# Patient Record
Sex: Male | Born: 1950 | Race: White | Hispanic: No | Marital: Married | Smoking: Former smoker
Health system: Southern US, Community
[De-identification: ages and names within clinical notes are randomized; demographics above are authoritative.]

## PROBLEM LIST (undated history)

## (undated) DIAGNOSIS — E785 Hyperlipidemia, unspecified: Secondary | ICD-10-CM

## (undated) DIAGNOSIS — L570 Actinic keratosis: Secondary | ICD-10-CM

## (undated) DIAGNOSIS — I10 Essential (primary) hypertension: Secondary | ICD-10-CM

## (undated) DIAGNOSIS — E119 Type 2 diabetes mellitus without complications: Secondary | ICD-10-CM

## (undated) DIAGNOSIS — E669 Obesity, unspecified: Secondary | ICD-10-CM

## (undated) HISTORY — DX: Essential (primary) hypertension: I10

## (undated) HISTORY — DX: Obesity, unspecified: E66.9

## (undated) HISTORY — DX: Type 2 diabetes mellitus without complications: E11.9

## (undated) HISTORY — DX: Actinic keratosis: L57.0

## (undated) HISTORY — DX: Hyperlipidemia, unspecified: E78.5

## (undated) HISTORY — PX: ROTATOR CUFF REPAIR: SHX139

---

## 1999-03-30 ENCOUNTER — Emergency Department (HOSPITAL_COMMUNITY): Admission: EM | Admit: 1999-03-30 | Discharge: 1999-03-30 | Payer: Self-pay | Admitting: Emergency Medicine

## 1999-03-30 ENCOUNTER — Encounter: Payer: Self-pay | Admitting: Emergency Medicine

## 2000-01-13 ENCOUNTER — Emergency Department (HOSPITAL_COMMUNITY): Admission: EM | Admit: 2000-01-13 | Discharge: 2000-01-13 | Payer: Self-pay | Admitting: Emergency Medicine

## 2000-01-22 ENCOUNTER — Encounter: Admission: RE | Admit: 2000-01-22 | Discharge: 2000-04-21 | Payer: Self-pay | Admitting: Internal Medicine

## 2001-06-19 ENCOUNTER — Ambulatory Visit (HOSPITAL_COMMUNITY): Admission: RE | Admit: 2001-06-19 | Discharge: 2001-06-19 | Payer: Self-pay | Admitting: Gastroenterology

## 2001-07-07 ENCOUNTER — Emergency Department (HOSPITAL_COMMUNITY): Admission: EM | Admit: 2001-07-07 | Discharge: 2001-07-07 | Payer: Self-pay | Admitting: Emergency Medicine

## 2001-10-09 ENCOUNTER — Ambulatory Visit (HOSPITAL_BASED_OUTPATIENT_CLINIC_OR_DEPARTMENT_OTHER): Admission: RE | Admit: 2001-10-09 | Discharge: 2001-10-09 | Payer: Self-pay | Admitting: Surgery

## 2001-10-09 ENCOUNTER — Encounter (INDEPENDENT_AMBULATORY_CARE_PROVIDER_SITE_OTHER): Payer: Self-pay | Admitting: *Deleted

## 2004-07-20 ENCOUNTER — Ambulatory Visit: Payer: Self-pay | Admitting: Internal Medicine

## 2004-07-25 ENCOUNTER — Ambulatory Visit: Payer: Self-pay | Admitting: Internal Medicine

## 2004-08-22 ENCOUNTER — Ambulatory Visit: Payer: Self-pay | Admitting: Internal Medicine

## 2004-09-27 ENCOUNTER — Ambulatory Visit: Payer: Self-pay | Admitting: Internal Medicine

## 2005-01-15 ENCOUNTER — Encounter: Admission: RE | Admit: 2005-01-15 | Discharge: 2005-01-15 | Payer: Self-pay | Admitting: Orthopedic Surgery

## 2005-02-01 ENCOUNTER — Ambulatory Visit: Payer: Self-pay | Admitting: Critical Care Medicine

## 2005-02-06 ENCOUNTER — Ambulatory Visit: Payer: Self-pay | Admitting: Internal Medicine

## 2005-03-07 ENCOUNTER — Ambulatory Visit (HOSPITAL_COMMUNITY): Admission: RE | Admit: 2005-03-07 | Discharge: 2005-03-07 | Payer: Self-pay | Admitting: Orthopedic Surgery

## 2005-03-07 ENCOUNTER — Ambulatory Visit (HOSPITAL_BASED_OUTPATIENT_CLINIC_OR_DEPARTMENT_OTHER): Admission: RE | Admit: 2005-03-07 | Discharge: 2005-03-08 | Payer: Self-pay | Admitting: Orthopedic Surgery

## 2005-05-22 ENCOUNTER — Ambulatory Visit: Payer: Self-pay | Admitting: Internal Medicine

## 2005-06-21 ENCOUNTER — Ambulatory Visit: Payer: Self-pay | Admitting: Internal Medicine

## 2005-09-24 ENCOUNTER — Ambulatory Visit: Payer: Self-pay | Admitting: Internal Medicine

## 2005-12-27 ENCOUNTER — Ambulatory Visit: Payer: Self-pay | Admitting: Internal Medicine

## 2006-03-28 ENCOUNTER — Ambulatory Visit: Payer: Self-pay | Admitting: Internal Medicine

## 2006-06-10 ENCOUNTER — Ambulatory Visit: Payer: Self-pay | Admitting: Internal Medicine

## 2006-06-18 ENCOUNTER — Ambulatory Visit: Payer: Self-pay | Admitting: Internal Medicine

## 2006-10-01 ENCOUNTER — Ambulatory Visit: Payer: Self-pay | Admitting: Internal Medicine

## 2006-10-01 LAB — CONVERTED CEMR LAB
ALT: 32 units/L (ref 0–40)
AST: 29 units/L (ref 0–37)
Albumin: 4.1 g/dL (ref 3.5–5.2)
Alkaline Phosphatase: 49 units/L (ref 39–117)
BUN: 19 mg/dL (ref 6–23)
Bilirubin, Direct: 0.2 mg/dL (ref 0.0–0.3)
CO2: 26 meq/L (ref 19–32)
Calcium: 9.2 mg/dL (ref 8.4–10.5)
Chloride: 104 meq/L (ref 96–112)
Cholesterol: 124 mg/dL (ref 0–200)
Creatinine, Ser: 0.9 mg/dL (ref 0.4–1.5)
GFR calc Af Amer: 113 mL/min
GFR calc non Af Amer: 93 mL/min
Glucose, Bld: 203 mg/dL — ABNORMAL HIGH (ref 70–99)
HDL: 47.4 mg/dL (ref >39.0)
Hgb A1c MFr Bld: 8.2 % — ABNORMAL HIGH (ref 4.6–6.0)
LDL Cholesterol: 66 mg/dL (ref 0–99)
Potassium: 3.9 meq/L (ref 3.5–5.1)
Sodium: 136 meq/L (ref 135–145)
Total Bilirubin: 0.8 mg/dL (ref 0.3–1.2)
Total CHOL/HDL Ratio: 2.6
Total Protein: 7 g/dL (ref 6.0–8.3)
Triglycerides: 52 mg/dL (ref 0–149)
VLDL: 10 mg/dL (ref 0–40)

## 2006-10-15 ENCOUNTER — Ambulatory Visit: Payer: Self-pay | Admitting: Pulmonary Disease

## 2007-02-04 ENCOUNTER — Ambulatory Visit: Payer: Self-pay | Admitting: Internal Medicine

## 2007-02-04 LAB — CONVERTED CEMR LAB
BUN: 13 mg/dL
CO2: 27 meq/L
Calcium: 9.6 mg/dL
Chloride: 102 meq/L
Creatinine, Ser: 0.9 mg/dL
GFR calc Af Amer: 112 mL/min
GFR calc non Af Amer: 93 mL/min
Glucose, Bld: 145 mg/dL — ABNORMAL HIGH
Hgb A1c MFr Bld: 7.6 % — ABNORMAL HIGH
Potassium: 4.3 meq/L
Sodium: 137 meq/L
Troponin I: 0.04 ng/mL (ref <0.06)

## 2007-04-14 ENCOUNTER — Ambulatory Visit: Payer: Self-pay | Admitting: Internal Medicine

## 2007-04-24 DIAGNOSIS — I10 Essential (primary) hypertension: Secondary | ICD-10-CM | POA: Insufficient documentation

## 2007-04-24 DIAGNOSIS — E1165 Type 2 diabetes mellitus with hyperglycemia: Secondary | ICD-10-CM

## 2007-04-24 DIAGNOSIS — E669 Obesity, unspecified: Secondary | ICD-10-CM | POA: Insufficient documentation

## 2007-06-27 ENCOUNTER — Ambulatory Visit: Payer: Self-pay | Admitting: Internal Medicine

## 2007-06-27 DIAGNOSIS — L570 Actinic keratosis: Secondary | ICD-10-CM | POA: Insufficient documentation

## 2007-06-27 DIAGNOSIS — E785 Hyperlipidemia, unspecified: Secondary | ICD-10-CM | POA: Insufficient documentation

## 2007-06-27 DIAGNOSIS — M2550 Pain in unspecified joint: Secondary | ICD-10-CM | POA: Insufficient documentation

## 2007-06-27 LAB — CONVERTED CEMR LAB
ALT: 33 units/L (ref 0–53)
AST: 28 units/L (ref 0–37)
Basophils Relative: 0 % (ref 0.0–1.0)
Bilirubin, Direct: 0.2 mg/dL (ref 0.0–0.3)
CO2: 31 meq/L (ref 19–32)
Calcium: 9.7 mg/dL (ref 8.4–10.5)
Chloride: 104 meq/L (ref 96–112)
Eosinophils Relative: 1.9 % (ref 0.0–5.0)
GFR calc non Af Amer: 93 mL/min
Glucose, Bld: 162 mg/dL — ABNORMAL HIGH (ref 70–99)
Ketones, ur: NEGATIVE mg/dL
LDL Cholesterol: 87 mg/dL (ref 0–99)
Nitrite: NEGATIVE
Platelets: 171 10*3/uL (ref 150–400)
RBC: 4.68 M/uL (ref 4.22–5.81)
RDW: 12.8 % (ref 11.5–14.6)
Specific Gravity, Urine: 1.01 (ref 1.000–1.03)
Total CHOL/HDL Ratio: 3.4
Total Protein, Urine: NEGATIVE mg/dL
Urine Glucose: NEGATIVE mg/dL
WBC: 6.2 10*3/uL (ref 4.5–10.5)

## 2007-08-08 ENCOUNTER — Encounter: Payer: Self-pay | Admitting: Internal Medicine

## 2007-10-21 ENCOUNTER — Ambulatory Visit: Payer: Self-pay | Admitting: Internal Medicine

## 2007-10-22 ENCOUNTER — Telehealth (INDEPENDENT_AMBULATORY_CARE_PROVIDER_SITE_OTHER): Payer: Self-pay | Admitting: *Deleted

## 2007-11-06 ENCOUNTER — Encounter (INDEPENDENT_AMBULATORY_CARE_PROVIDER_SITE_OTHER): Payer: Self-pay | Admitting: *Deleted

## 2007-11-11 LAB — CONVERTED CEMR LAB
BUN: 13 mg/dL (ref 6–23)
GFR calc Af Amer: 112 mL/min
Glucose, Bld: 162 mg/dL — ABNORMAL HIGH (ref 70–99)
Potassium: 4 meq/L (ref 3.5–5.1)

## 2008-03-17 ENCOUNTER — Ambulatory Visit: Payer: Self-pay | Admitting: Internal Medicine

## 2008-03-18 LAB — CONVERTED CEMR LAB
ALT: 27 units/L (ref 0–53)
Alkaline Phosphatase: 54 units/L (ref 39–117)
Bilirubin, Direct: 0.1 mg/dL (ref 0.0–0.3)
CO2: 30 meq/L (ref 19–32)
Chloride: 106 meq/L (ref 96–112)
Glucose, Bld: 110 mg/dL — ABNORMAL HIGH (ref 70–99)
HDL: 36.6 mg/dL — ABNORMAL LOW (ref >39.0)
Hemoglobin: 14.9 g/dL (ref 13.0–17.0)
LDL Cholesterol: 88 mg/dL (ref 0–99)
Lymphocytes Relative: 22.8 % (ref 12.0–46.0)
Monocytes Relative: 8 % (ref 3.0–12.0)
Neutrophils Relative %: 67.8 % (ref 43.0–77.0)
Platelets: 155 10*3/uL (ref 150–400)
Potassium: 4.1 meq/L (ref 3.5–5.1)
RDW: 13.3 % (ref 11.5–14.6)
Sodium: 141 meq/L (ref 135–145)
Total Bilirubin: 1 mg/dL (ref 0.3–1.2)
Total CHOL/HDL Ratio: 3.7
Total Protein: 7.6 g/dL (ref 6.0–8.3)
Triglycerides: 55 mg/dL (ref 0–149)
VLDL: 11 mg/dL (ref 0–40)

## 2008-07-01 ENCOUNTER — Ambulatory Visit: Payer: Self-pay | Admitting: Internal Medicine

## 2008-07-05 ENCOUNTER — Telehealth (INDEPENDENT_AMBULATORY_CARE_PROVIDER_SITE_OTHER): Payer: Self-pay | Admitting: *Deleted

## 2008-09-28 ENCOUNTER — Ambulatory Visit: Payer: Self-pay | Admitting: Internal Medicine

## 2008-09-28 DIAGNOSIS — R05 Cough: Secondary | ICD-10-CM

## 2008-09-28 LAB — CONVERTED CEMR LAB
CO2: 26 meq/L (ref 19–32)
Calcium: 9.4 mg/dL (ref 8.4–10.5)
Chloride: 104 meq/L (ref 96–112)
Cholesterol: 127 mg/dL (ref 0–200)
Creatinine, Ser: 0.9 mg/dL (ref 0.4–1.5)
Glucose, Bld: 168 mg/dL — ABNORMAL HIGH (ref 70–99)
HDL: 41 mg/dL (ref >39.00)
Total CHOL/HDL Ratio: 3
Triglycerides: 42 mg/dL (ref 0.0–149.0)

## 2009-03-08 ENCOUNTER — Ambulatory Visit: Payer: Self-pay | Admitting: Internal Medicine

## 2009-03-08 DIAGNOSIS — J310 Chronic rhinitis: Secondary | ICD-10-CM

## 2009-03-08 LAB — CONVERTED CEMR LAB
ALT: 27 units/L (ref 0–53)
Albumin: 4 g/dL (ref 3.5–5.2)
Alkaline Phosphatase: 53 units/L (ref 39–117)
BUN: 18 mg/dL (ref 6–23)
Bilirubin, Direct: 0.1 mg/dL (ref 0.0–0.3)
CO2: 27 meq/L (ref 19–32)
Calcium: 9.4 mg/dL (ref 8.4–10.5)
GFR calc non Af Amer: 105.47 mL/min (ref >60)
Glucose, Bld: 131 mg/dL — ABNORMAL HIGH (ref 70–99)
Sodium: 140 meq/L (ref 135–145)
Total Protein: 7.1 g/dL (ref 6.0–8.3)

## 2009-04-19 ENCOUNTER — Encounter: Payer: Self-pay | Admitting: Internal Medicine

## 2009-05-30 ENCOUNTER — Ambulatory Visit: Payer: Self-pay | Admitting: Internal Medicine

## 2009-05-31 ENCOUNTER — Telehealth: Payer: Self-pay | Admitting: Internal Medicine

## 2009-05-31 LAB — CONVERTED CEMR LAB
BUN: 16 mg/dL (ref 6–23)
Calcium: 9.1 mg/dL (ref 8.4–10.5)
GFR calc non Af Amer: 91.99 mL/min (ref >60)
Glucose, Bld: 205 mg/dL — ABNORMAL HIGH (ref 70–99)
Hgb A1c MFr Bld: 7.8 % — ABNORMAL HIGH (ref 4.6–6.5)
Sodium: 140 meq/L (ref 135–145)
Total CHOL/HDL Ratio: 3

## 2009-09-16 ENCOUNTER — Telehealth: Payer: Self-pay | Admitting: Internal Medicine

## 2009-10-03 ENCOUNTER — Ambulatory Visit: Payer: Self-pay | Admitting: Internal Medicine

## 2009-10-04 LAB — CONVERTED CEMR LAB
AST: 23 units/L (ref 0–37)
BUN: 15 mg/dL (ref 6–23)
Basophils Absolute: 0 10*3/uL (ref 0.0–0.1)
Bilirubin Urine: NEGATIVE
Bilirubin, Direct: 0.1 mg/dL (ref 0.0–0.3)
Calcium: 9.5 mg/dL (ref 8.4–10.5)
Cholesterol: 112 mg/dL (ref 0–200)
Creatinine, Ser: 0.9 mg/dL (ref 0.4–1.5)
GFR calc non Af Amer: 91.88 mL/min (ref >60)
Glucose, Bld: 186 mg/dL — ABNORMAL HIGH (ref 70–99)
HCT: 41 % (ref 39.0–52.0)
HDL: 43.2 mg/dL (ref >39.00)
LDL Cholesterol: 56 mg/dL (ref 0–99)
Leukocytes, UA: NEGATIVE
Lymphocytes Relative: 36.1 % (ref 12.0–46.0)
Lymphs Abs: 1.8 10*3/uL (ref 0.7–4.0)
Microalb Creat Ratio: 4.9 mg/g (ref 0.0–30.0)
Monocytes Relative: 7.7 % (ref 3.0–12.0)
Neutrophils Relative %: 53.7 % (ref 43.0–77.0)
Nitrite: NEGATIVE
Platelets: 140 10*3/uL — ABNORMAL LOW (ref 150.0–400.0)
RDW: 13.6 % (ref 11.5–14.6)
TSH: 1.84 microintl units/mL (ref 0.35–5.50)
Total Bilirubin: 0.5 mg/dL (ref 0.3–1.2)
VLDL: 12.8 mg/dL (ref 0.0–40.0)
pH: 5.5 (ref 5.0–8.0)

## 2009-11-22 ENCOUNTER — Ambulatory Visit: Payer: Self-pay | Admitting: Internal Medicine

## 2009-11-22 DIAGNOSIS — R109 Unspecified abdominal pain: Secondary | ICD-10-CM | POA: Insufficient documentation

## 2009-11-22 LAB — CONVERTED CEMR LAB
Ketones, ur: NEGATIVE mg/dL
Urine Glucose: NEGATIVE mg/dL
Urobilinogen, UA: 0.2 (ref 0.0–1.0)

## 2010-01-05 ENCOUNTER — Ambulatory Visit: Payer: Self-pay | Admitting: Internal Medicine

## 2010-01-06 LAB — CONVERTED CEMR LAB
BUN: 21 mg/dL (ref 6–23)
Calcium: 9.4 mg/dL (ref 8.4–10.5)
Creatinine, Ser: 0.9 mg/dL (ref 0.4–1.5)
GFR calc non Af Amer: 98.06 mL/min (ref >60)
Glucose, Bld: 209 mg/dL — ABNORMAL HIGH (ref 70–99)

## 2010-01-17 ENCOUNTER — Emergency Department (HOSPITAL_COMMUNITY): Admission: EM | Admit: 2010-01-17 | Discharge: 2010-01-17 | Payer: Self-pay | Admitting: Emergency Medicine

## 2010-04-11 ENCOUNTER — Ambulatory Visit: Payer: Self-pay | Admitting: Internal Medicine

## 2010-04-12 LAB — CONVERTED CEMR LAB
AST: 25 units/L (ref 0–37)
Albumin: 4.3 g/dL (ref 3.5–5.2)
Bilirubin Urine: NEGATIVE
Bilirubin, Direct: 0.1 mg/dL (ref 0.0–0.3)
CO2: 26 meq/L (ref 19–32)
Calcium: 9.7 mg/dL (ref 8.4–10.5)
Chloride: 105 meq/L (ref 96–112)
Leukocytes, UA: NEGATIVE
Nitrite: NEGATIVE
Sodium: 139 meq/L (ref 135–145)
Specific Gravity, Urine: >=1.03 (ref 1.000–1.030)
Total Bilirubin: 0.5 mg/dL (ref 0.3–1.2)
Total CHOL/HDL Ratio: 3
Total Protein: 7.1 g/dL (ref 6.0–8.3)
Urobilinogen, UA: 0.2 (ref 0.0–1.0)

## 2010-06-21 ENCOUNTER — Telehealth (INDEPENDENT_AMBULATORY_CARE_PROVIDER_SITE_OTHER): Payer: Self-pay | Admitting: *Deleted

## 2010-08-06 LAB — CONVERTED CEMR LAB
ALT: 29 units/L (ref 0–53)
AST: 26 units/L (ref 0–37)
Alkaline Phosphatase: 55 units/L (ref 39–117)
Basophils Absolute: 0 10*3/uL (ref 0.0–0.1)
Basophils Relative: 0.2 % (ref 0.0–3.0)
Bilirubin, Direct: 0.1 mg/dL (ref 0.0–0.3)
CO2: 30 meq/L (ref 19–32)
CRP, High Sensitivity: 5 (ref 0.00–5.00)
Chloride: 105 meq/L (ref 96–112)
Creatinine, Ser: 0.8 mg/dL (ref 0.4–1.5)
Eosinophils Absolute: 0.1 10*3/uL (ref 0.0–0.7)
GFR calc non Af Amer: 106 mL/min
Hemoglobin, Urine: NEGATIVE
Hgb A1c MFr Bld: 8 % — ABNORMAL HIGH (ref 4.6–6.0)
Ketones, ur: NEGATIVE mg/dL
LDL Cholesterol: 72 mg/dL (ref 0–99)
Leukocytes, UA: NEGATIVE
Lymphocytes Relative: 29.6 % (ref 12.0–46.0)
MCHC: 34.5 g/dL (ref 30.0–36.0)
MCV: 91 fL (ref 78.0–100.0)
Neutrophils Relative %: 59.5 % (ref 43.0–77.0)
Platelets: 138 10*3/uL — ABNORMAL LOW (ref 150–400)
Potassium: 4.7 meq/L (ref 3.5–5.1)
RBC: 4.51 M/uL (ref 4.22–5.81)
Sodium: 140 meq/L (ref 135–145)
Specific Gravity, Urine: 1.015 (ref 1.000–1.03)
TSH: 2.17 microintl units/mL (ref 0.35–5.50)
Total Bilirubin: 0.6 mg/dL (ref 0.3–1.2)
Total CHOL/HDL Ratio: 2.9
Urobilinogen, UA: 0.2 (ref 0.0–1.0)
VLDL: 11 mg/dL (ref 0–40)
WBC: 5.3 10*3/uL (ref 4.5–10.5)

## 2010-08-10 NOTE — Assessment & Plan Note (Signed)
Summary: Primary svc/ f/u abd pain/ dm/ hyperlipidemia   Primary Provider/Referring Provider:  Sherene Sires  CC:  Followup.  Pt still c/p rt side pain- the same and no better or worse.  He states that sometimes after eating feels the need to belch but states this is hard to do. Marland Kitchen  History of Present Illness: 39 yowm quit smoking 2000 with morbid obesity  achieving a peak all-time weight of 228 complicated by hypertension and diabetes.    July 01, 2008 cpx c/o increase fbs 170 but no symptoms of polyuria, polydypsia, hgba1c 8 rec diet, ex, nutrition eval.   October 03, 2009 CPX with recent uri did fine following calendar in term of use of calendar. no dm symptoms, paresthesias, tia or claudication. Sees eye doctor yearly.  Nov 22, 2009 Followup fasting for labs. Not following med calendar.   Pt c/o lower right side abdominal pain x 2 wks "off and on".  He states that pain feels achy and sometimes sharp. lasts few secs better after passes gas. migrates around but most severe rlq, no rad to flank or scrotum, no dysuria or hematuria.   --Labs showed A1C 9.0, glucovance was increased to three times a day. rec citrucel  January 05, 2010--Presents for follow up and med review. He has been doing ok. BS averaging  90-180, he has had only 1 low sugar at 75 when he did not eat. We discussed diet and exercise /weight loss. We reveiwed his meds and organized his meds into a med calendar. rec Stop Actos Continue on Glucovance 2.5mg /500mg  three times a day  Diabetic diet.  Increase activity as tolerated.  Follow med calendar closely and bring to each vist  see page 2 April 11, 2010 Followup.  Pt still c/p rt side pain- the same, no better or worse.  He states that sometimes after eating feels the need to belch but states this is hard to do.   not taking citrucel regulary, did not bring med calendar, pain migratory and positional.  no anorexia nause or change in bowel/bladder habits.  Pt denies any significant sore  throat, dysphagia, itching, sneezing,  nasal congestion or excess secretions,  fever, chills, sweats, unintended wt loss, pleuritic or exertional cp, hempoptysis, change in activity tolerance  orthopnea pnd or leg swelling   Current Medications (verified): 1)  Nasonex 50 Mcg/act  Susp (Mometasone Furoate) .... Use 2 Sprays At Bedtime 2)  Glucovance 2.5-500 Mg  Tabs (Glyburide-Metformin) .... Take 1 Tablet When You Wake Up, 1 With Your "lunch" and 1 With Your "supper" 3)  Citrucel  Powd (Methylcellulose (Laxative)) .... One Tsp Twice Daily With Glass of Water 4)  Advicor 500-20 Mg  Tb24 (Niacin-Lovastatin) .... Take At Bedtime 5)  Bayer Low Strength 81 Mg Tbec (Aspirin) .Marland Kitchen.. 1 Once Daily 6)  Benicar 20 Mg  Tabs (Olmesartan Medoxomil) .... Take 1/2 Tablet Once Daily 7)  Afrin Nasal Spray 0.05 %  Soln (Oxymetazoline Hcl) .... 2 Puffs Two Times A Day For 5 Days Before Nasonex As Needed 8)  Advil 200 Mg  Caps (Ibuprofen) .... Take 3 Tablets With Meals Every 8 Hours As Needed 9)  Mucinex Dm 30-600 Mg Xr12h-Tab (Dextromethorphan-Guaifenesin) .Marland Kitchen.. 1-2 Every 12 Hours As Needed 10)  Freestyle Test  Strp (Glucose Blood) .... Test As Directed  Allergies (verified): No Known Drug Allergies  Past History:  Past Medical History: ACTINIC KERATOSIS (ICD-702.0)   Referred to [facility] 06/27/2007 PAIN IN JOINT, SITE UNSPECIFIED (ICD-719.40)...............................................[place]phy HYPERLIPIDEMIA (ICD-272.4)      -  Target LDL < 70 due to DM OBESITY NOS (ICD-278.00)     -  target weight 190, ideal <  171, peak 228 October 21, 2007      - REC:  return to nutrition with food diary 07/01/08 > declined referral  DM (ICD-250.00) A1C 9.0>8.0 01/05/10 Glucovance 2.5mg  /500mg   three times a day , Actos stopped > 9.2 April 11, 2010  HYPERTENSION (ICD-401.9) HEALTH MAINTENANCE.....................................................................Marland KitchenWert     - Pneumovax 11/06     - Td 06/27/07     -  Colonoscopy letter sent 09/19/06, reviewed with pt 07/01/08> > referred again October 03, 2009      - CPX October 03, 2009     Vital Signs:  Patient profile:   60 year old male Weight:      211 pounds O2 Sat:      97 % on Room air Temp:     97.6 degrees F oral Pulse rate:   77 / minute BP sitting:   102 / 66  (left arm)  Vitals Entered By: Vernie Murders (April 11, 2010 4:07 PM)  O2 Flow:  Room air  Physical Exam  Additional Exam:  Ambulatory healthy appearing obese white male in no acute distress.  wt 227 07/01/08  >  223 May 30, 2009 > 219 October 03, 2009 > 221 Nov 22, 2009 >>225 January 06, 2010 > 211 April 11, 2010  HEENT: nl dentition,  and orophanx. mod non-specific turbinate edema.  Nl external ear canals without cough reflex Neck without JVD/Nodes/TM. Carotid upstrokes brsk with no bruits. Lungs insp and exp rhonchi with exp cough, barking quality, mostly dry RRR no s3 or murmur or increase in P2. No edema Abd soft and benign with nl excursion in the supine position. No bruits or organomegaly Ext warm without calf tenderness, cyanosis clubbing    Cholesterol               118 mg/dL                   0-454     ATP III Classification            Desirable:  < 200 mg/dL                    Borderline High:  200 - 239 mg/dL               High:  > = 240 mg/dL   Triglycerides             52.0 mg/dL                  0.9-811.9     Normal:  <150 mg/dL     Borderline High:  147 - 199 mg/dL   HDL                  [L]  82.95 mg/dL                 >62.13   VLDL Cholesterol          10.4 mg/dL                  0.8-65.7   LDL Cholesterol           69 mg/dL                    8-46  CHO/HDL Ratio:  CHD Risk  3                    Men          Women     1/2 Average Risk     3.4          3.3     Average Risk          5.0          4.4     2X Average Risk          9.6          7.1     3X Average Risk          15.0          11.0                            Tests: (2) BMP (METABOL)   Sodium                    139 mEq/L                   135-145   Potassium                 4.3 mEq/L                   3.5-5.1   Chloride                  105 mEq/L                   96-112   Carbon Dioxide            26 mEq/L                    19-32   Glucose              [H]  169 mg/dL                   73-22   BUN                       23 mg/dL                    0-25   Creatinine                0.9 mg/dL                   4.2-7.0   Calcium                   9.7 mg/dL                   6.2-37.6   GFR                       89.42 mL/min                >60  Tests: (3) Hemoglobin A1C (A1C)   Hemoglobin A1C       [H]  9.2 %                       4.6-6.5     Glycemic Control Guidelines for People with Diabetes:     Non Diabetic:  <6%  Goal of Therapy: <7%     Additional Action Suggested:  >8%   Tests: (4) Hepatic/Liver Function Panel (HEPATIC)   Total Bilirubin           0.5 mg/dL                   1.6-1.0   Direct Bilirubin          0.1 mg/dL                   9.6-0.4   Alkaline Phosphatase      71 U/L                      39-117   AST                       25 U/L                      0-37   ALT                       31 U/L                      0-53   Total Protein             7.1 g/dL                    5.4-0.9   Albumin                   4.3 g/dL                    8.1-1.9  Tests: (5) UDip Only (UDIP)   Color                     LT. YELLOW       RANGE:  Yellow;Lt. Yellow   Clarity                   CLEAR                       Clear   Specific Gravity          >=1.030                     1.000 - 1.030   Urine Ph                  5.5                         5.0-8.0   Protein                   NEGATIVE                    Negative   Urine Glucose             NEGATIVE                    Negative   Ketones                   TRACE                       Negative   Urine Bilirubin  NEGATIVE                    Negative   Blood                      TRACE-INTACT                Negative   Urobilinogen              0.2                         0.0 - 1.0   Leukocyte Esterace        NEGATIVE                    Negative   Nitrite                   NEGATIVE                    Negative  Impression & Recommendations:  Problem # 1:  DIABETES MELLITUS (ICD-250.00) His updated medication list for this problem includes:    Glucovance 2.5-500 Mg Tabs (Glyburide-metformin) .Marland Kitchen... Take 1 tablet when you wake up, 1 with your "lunch" and 1 with your "supper"    Bayer Low Strength 81 Mg Tbec (Aspirin) .Marland Kitchen... 1 once daily   Labs Reviewed: Creat: 0.9 (01/05/2010)    Reviewed HgBA1c results: 8.0 (01/05/2010) > 9.2 April 11, 2010  so work harder on diet/ ex  9.0 (10/03/2009)  Problem # 2:  HYPERLIPIDEMIA (ICD-272.4)  His updated medication list for this problem includes:    Advicor 500-20 Mg Tb24 (Niacin-lovastatin) .Marland Kitchen... Take at bedtime    Labs Reviewed: SGOT: 23 (10/03/2009)   SGPT: 27 (10/03/2009)   HDL:43.20 (10/03/2009), 42.50 (05/30/2009)  LDL:56 (10/03/2009), 72 (05/30/2009)  > 69  April 11, 2010  Chol:112 (10/03/2009), 128 (05/30/2009)  Trig:64.0 (10/03/2009), 69.0 (05/30/2009)  Problem # 3:  HYPERTENSION (ICD-401.9)  His updated medication list for this problem includes:    Benicar 20 Mg Tabs (Olmesartan medoxomil) .Marland Kitchen... Take 1/2 tablet once daily  Problem # 4:  ABDOMINAL PAIN, UNSPECIFIED (ICD-789.00)  Orders: Est. Patient Level IV ([address]) TLB-Hepatic/Liver Function Pnl (80076-HEPATIC) Est. Patient Level IV (99214)  Nl u/a and lfts plus Classic subdiaphragmatic migratory  pain pattern suggests ibs:  daytime, not exacerbated by ex or coughing, worse in sitting position, associated with generalized abd bloating, not present supine due to the dome effect of the diaphram canceled in that position  not compliant with citrucel, try this first then proceed with w/u if needed  Other Orders: TLB-Lipid Panel (80061-LIPID) TLB-BMP  (Basic Metabolic Panel-BMET) (80048-METABOL) TLB-A1C / Hgb A1C (Glycohemoglobin) (83036-A1C) TLB-Udip ONLY (81003-UDIP)  Patient Instructions: 1)  Take citrucel perfectly regularly for two weeks then call Libby at [phone] for abd ultrasound if not better at that point 2)  See calendar for specific medication instructions and bring it back for each and every office visit for every healthcare provider you see.  Without it,  you may not receive the best quality medical care that we feel you deserve.  3)  Return to office in 3 months, sooner if needed

## 2010-08-10 NOTE — Progress Notes (Signed)
Summary: lancets/strips rx   Phone Note Call from Patient Call back at Home Phone [phone]   Caller: Spouse sharon Sadek Call For: wert Summary of Call: pt needs new rx for lancets and strips. freestyle lancets 28 gauge- also freestyle strips. [facility] [address] # [phone] Initial call taken by: Tivis Ringer, CNA,  June 21, 2010 9:21 AM  Follow-up for Phone Call        Pt wife requesting rx for freestyle strips and freestyle 28g lancets.  [facility] [place]  pt currently checking blood suger twice weekly.  Rxs sent.  Wife aware. Follow-up by: Gweneth Dimitri RN,  June 21, 2010 11:03 AM    New/Updated Medications: FREESTYLE LANCETS  MISC (LANCETS) 28 gauge.  Use as directed. Prescriptions: FREESTYLE LANCETS  MISC (LANCETS) 28 gauge.  Use as directed.  #1 box x 3   Entered by:   Gweneth Dimitri RN   Authorized by:   Nyoka Cowden MD   Signed by:   Gweneth Dimitri RN on 06/21/2010   Method used:   [facility]        [facility]  [address] [phone] (retail)       [address]       [place], [place]  [address]       Ph: [phone] or [phone]       Fax: [phone]   RxID:   [id number] FREESTYLE TEST  STRP (GLUCOSE BLOOD) test as directed  #25 x 1   Entered by:   Gweneth Dimitri RN   Authorized by:   Nyoka Cowden MD   Signed by:   Gweneth Dimitri RN on 06/21/2010   Method used:   [facility]        [facility]  [address] [phone][facility] (retail)       [address]       [place], [place]  [address]       Ph: [phone] or [phone]       Fax: [phone]   RxID:   [id number]
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
## 2010-08-10 NOTE — Progress Notes (Signed)
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 spouse states that pt is sick w/ flu and out of strips and she would really appreciate this being called in asap.  Initial call taken by: Tivis Ringer, CNA,  September 16, 2009 12:38 PM  Follow-up for Phone Call        called and spoke with pts wife and the test strips for the freestyle have been sent to the pharmacy for the pt Randell Loop CMA  September 16, 2009 1:44 PM     New/Updated Medications: FREESTYLE TEST  STRP (GLUCOSE BLOOD) test as directed Prescriptions: FREESTYLE TEST  STRP (GLUCOSE BLOOD) test as directed  #25 x 1   Entered by:   Randell Loop CMA   Authorized by:   Nyoka Cowden MD   Signed by:   Randell Loop CMA on 09/16/2009   Method used:   Electronically to        [facility]  [address] [phone]* (retail)       [address]       [place], [place]  [address]       Ph: [phone] or [phone]       Fax: [phone]   RxID:   [id number]

## 2010-09-30 ENCOUNTER — Other Ambulatory Visit: Payer: Self-pay | Admitting: Internal Medicine

## 2010-10-16 ENCOUNTER — Encounter: Payer: Self-pay | Admitting: Internal Medicine

## 2010-10-18 ENCOUNTER — Ambulatory Visit (INDEPENDENT_AMBULATORY_CARE_PROVIDER_SITE_OTHER): Payer: 59 | Admitting: Internal Medicine

## 2010-10-18 ENCOUNTER — Encounter: Payer: Self-pay | Admitting: Internal Medicine

## 2010-10-18 ENCOUNTER — Other Ambulatory Visit (INDEPENDENT_AMBULATORY_CARE_PROVIDER_SITE_OTHER): Payer: 59

## 2010-10-18 VITALS — BP 114/62 | HR 73 | Temp 97.5°F | Ht 67.0 in | Wt 209.2 lb

## 2010-10-18 DIAGNOSIS — R109 Unspecified abdominal pain: Secondary | ICD-10-CM

## 2010-10-18 DIAGNOSIS — E119 Type 2 diabetes mellitus without complications: Secondary | ICD-10-CM

## 2010-10-18 DIAGNOSIS — E785 Hyperlipidemia, unspecified: Secondary | ICD-10-CM

## 2010-10-18 LAB — BASIC METABOLIC PANEL
BUN: 19 mg/dL (ref 6–23)
CO2: 29 mEq/L (ref 19–32)
Calcium: 9.3 mg/dL (ref 8.4–10.5)
Creatinine, Ser: 0.9 mg/dL (ref 0.4–1.5)

## 2010-10-18 LAB — URINALYSIS
Leukocytes, UA: NEGATIVE
Nitrite: NEGATIVE
Specific Gravity, Urine: 1.02 (ref 1.000–1.030)
Urine Glucose: 100
Urobilinogen, UA: 0.2 (ref 0.0–1.0)

## 2010-10-18 LAB — LIPID PANEL
Cholesterol: 145 mg/dL (ref 0–200)
LDL Cholesterol: 87 mg/dL (ref 0–99)
Total CHOL/HDL Ratio: 4

## 2010-10-18 LAB — MICROALBUMIN / CREATININE URINE RATIO: Microalb, Ur: 2 mg/dL — ABNORMAL HIGH (ref 0.0–1.9)

## 2010-10-18 LAB — HEMOGLOBIN A1C: Hgb A1c MFr Bld: 10.8 % — ABNORMAL HIGH (ref 4.6–6.5)

## 2010-10-18 MED ORDER — NIACIN-SIMVASTATIN ER 500-20 MG PO TB24: 1.0000 | ORAL_TABLET | Freq: Every day | ORAL | Status: DC

## 2010-10-18 NOTE — Progress Notes (Signed)
Subjective:    Patient ID: [name] Little, male    DOB: [birth year], 60 y.o.   MRN: [id number]  HPI 77 yowm quit smoking 2000 with morbid obesity achieving a peak all-time weight of 228 complicated by hypertension and diabetes.   July 01, 2008 cpx c/o increase fbs 170 but no symptoms of polyuria, polydypsia, hgba1c 8 rec diet, ex, nutrition eval.    Nov 22, 2009 Followup fasting for labs. Not following med calendar. Pt c/o lower right side abdominal pain x 2 wks "off and on". He states that pain feels achy and sometimes sharp. lasts few secs better after passes gas. migrates around but most severe rlq, no rad to flank or scrotum, no dysuria or hematuria. --Labs showed A1C 9.0, glucovance was increased to three times a day. rec citrucel  > pain resolved  January 05, 2010--Presents for follow up and med review. He has been doing ok. BS averaging 90-180, he has had only 1 low sugar at 75 when he did not eat. We discussed diet and exercise /weight loss. We reveiwed his meds and organized his meds into a med calendar. rec Stop Actos  Continue on Glucovance 2.5mg /500mg  three times a day  Diabetic diet.  Increase activity as tolerated.  Follow med calendar closely and bring to each vist     2 April 11, 2010 Followup. No med calendar. Cc c/p rt side pain- the same, no better or worse. He states that sometimes after eating feels the need to belch but states this is hard to do. not taking citrucel regulary,  , pain migratory and positional. no anorexia nause or change in bowel/bladder habits rec citrucel > resolved then stopped citrucel  10/18/2010 ov with med calendar in hand but not following, doing cbgs at various times instead of fasting as rec. No overt symptoms of hyper or hypoglycemia.  Pt denies any significant sore throat, dysphagia, itching, sneezing,  nasal congestion or excess/ purulent secretions,  fever, chills, sweats, unintended wt loss, pleuritic or exertional cp, hempoptysis, orthopnea pnd  or leg swelling.  No abd pain.   Also denies any obvious fluctuation of symptoms with weather or environmental changes or other aggravating or alleviating factors.     Past Medical History:  ACTINIC KERATOSIS (ICD-702.0)  Referred to [facility] 06/27/2007  PAIN IN JOINT, SITE UNSPECIFIED (ICD-719.40)...............................................[place]  HYPERLIPIDEMIA (ICD-272.4)  - Target LDL < 70 due to DM  OBESITY NOS (ICD-278.00)  - target weight 190, ideal < 171, peak 228 October 21, 2007  - REC: return to nutrition with food diary 07/01/08 > declined referral  DM (ICD-250.00)  A1C 9.0>8.0 01/05/10 Glucovance 2.5mg  /500mg  three times a day , Actos stopped > 9.2 April 11, 2010  HYPERTENSION (ICD-401.9)  HEALTH MAINTENANCE.....................................................................Marland KitchenWert  - Pneumovax 05/2005 - Td 06/27/2007 - Colonoscopy letter sent 09/19/06, reviewed with pt 07/01/08> > referred again October 03, 2009 >did not make appt - CPX October 03, 2009                Review of Systems     Objective:   Physical Exam  Ambulatory healthy appearing obese white male in no acute distress.  wt 227 07/01/08 > 223 May 30, 2009 > 219 October 03, 2009  > 209 10/18/2010  HEENT: nl dentition, and orophanx. mod non-specific turbinate edema. Nl external ear canals without cough reflex  Neck without JVD/Nodes/TM. Carotid upstrokes brsk with no bruits.  Lungs insp and exp rhonchi with exp cough, barking quality, mostly dry  RRR no s3  or murmur or increase in P2. No edema  Abd soft and benign with nl excursion in the supine position. No bruits or organomegaly  Ext warm without calf tenderness, cyanosis clubbing       Assessment & Plan:

## 2010-10-18 NOTE — Patient Instructions (Addendum)
See calendar for specific medication instructions and bring it back for each and every office visit for every healthcare provider you see.  Without it,  you may not receive the best quality medical care that we feel you deserve.  You will note that the calendar groups together  your maintenance  medications that are timed at particular times of the day.  Think of this as your checklist for what your doctor has instructed you to do until your next evaluation to see what benefit  there is  to staying on a consistent group of medications intended to keep you well.  The other group at the bottom is entirely up to you to use as you see fit  for specific symptoms that may arise between visits that require you to treat them on an as needed basis.  Think of this as your action plan or "what if" list.   Separating the top medications from the bottom group is fundamental to providing you adequate care going forward.    Stop advicor when you finish it and replace it simcor   Please schedule a follow up visit in 3 months but call sooner if needed for CPX but do not need fasting  Late add:  Needs to move up Cpx with all meds in hand due to hgb a1c drifting up

## 2010-10-18 NOTE — Assessment & Plan Note (Signed)
ldl drifting up above goal with his hgba1c so ??? Really compliant??? No change in meds until assure med reconciliation

## 2010-10-18 NOTE — Assessment & Plan Note (Signed)
Rec continue citrucel at least once daily. Cautioned that the atypical cp/abd pain previously experienced if they recur on citrucel then should not be ignored but can't use this guide if not following the instructions and that w/u for atypical cp/ abd pain can be quite elaborate/ expensive so want to use preventive rx

## 2010-10-18 NOTE — Assessment & Plan Note (Signed)
HgbA1C drifting up and pt with very little insight into self management, declining referral back to nutrition.  Rec work harder on wt loss, f/u with all meds in hand next ov.

## 2010-10-20 ENCOUNTER — Telehealth: Payer: Self-pay | Admitting: Internal Medicine

## 2010-10-20 NOTE — Telephone Encounter (Signed)
Call patient : Study is Remarkable for hgb a1c too high, needs to move up cpx to w/in the next 4 weeks or so and bring all meds/bottles as well as calendar with him to verify they are accurate before consider change in rx  Advised pt of lab results. Pt verbalized understanding and could not come in until 11/14/10 at 9:00 due to pt work schedule. Pt aware to bring all meds and med calendar w/ him at that visit. Pt verbalized understandning

## 2010-11-07 ENCOUNTER — Encounter: Payer: 59 | Admitting: Internal Medicine

## 2010-11-10 ENCOUNTER — Encounter: Payer: Self-pay | Admitting: Internal Medicine

## 2010-11-14 ENCOUNTER — Encounter: Payer: 59 | Admitting: Internal Medicine

## 2010-11-21 NOTE — Assessment & Plan Note (Signed)
[facility]                             PULMONARY OFFICE NOTE   [name], [name]                  MRN:          [id number]  DATE:02/04/2007                            DOB:          08/25/1950    PRIMARY SERVICE/FOLLOWUP OFFICE VISIT   A 60 year old white male with diabetic with an unusual schedule for  taking his medicines because he works the night shift. He now carries a  medication calendar that corresponds to the time of day that he uses his  medicines and tells me he keeps the same schedule whether he works or  not.   His concern is that he has been having positional left shoulder pain  with numbness. He has found that certain positions that he gets into  either sleeping or driving aggravate the problem and describes tingling  of the left hand associated with the discomfort. It never occurs with  exertion, although he is not aerobically very active. He has not yet  tried Advil yet to take for the discomfort, although it is recommended  for positional pain.   For full inventory of medications, please see face sheet dated February 04, 2007.   PHYSICAL EXAMINATION:  This is a pleasant, ambulatory, obese white male  in no acute distress weighing 224 pounds and no change from baseline.  Blood pressure 128/80.  HEENT: Is unremarkable. Oropharynx is clear.  LUNGS: Lung fields are completely clear bilaterally to auscultation and  percussion.  HEART: Regular rate and rhythm without murmur, gallop or rub.  ABDOMEN: Soft, benign.  EXTREMITIES: Warm without calf tenderness, cyanosis, clubbing or edema.  He did have very mild tenderness over the insertion of the left deltoid,  but otherwise completely normal musculoskeletal and neurologic  examination of the left upper extremity.   IMPRESSION:  Intermittent left arm discomfort and numbness, probably  benign. The patient is concerned because he has heard that patients can  have the same problem  when they are having a heart attack. The problem  strongly against heart disease is the fact that the problem has been  intermittent, positional and not all related to exertion chronically. I  have reviewed the distinction between musculoskeletal/positional  shoulder discomfort versus cardiac related discomfort emphasizing that  if there is any association with exertion using his legs, then I would  certainly raise the issue. It tends to also obviously to come and go  rather quickly than the chronic discomfort.   I have advised the patient to try Advil. Since he is not having any more  pain today than usual, I did a troponin-Ijust to be complete, but  checked a BMET and a hemoglobin A1c for his longterm diabetes control.   I have reviewed each and every one of his maintenance versus p.r.n.'s  from his list and asked him to try the Advil first. If not effective, I  plan to refer him to Orthopedics.     [name] [name]. [name] Sires, MD, [facility]  Electronically Signed    MBW/MedQ  DD: 02/04/2007  DT: 02/04/2007  Job #: [id number]

## 2010-11-21 NOTE — Assessment & Plan Note (Signed)
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  His last hemoglobin A1c was 7.6 on July 29.  He denies any overt  symptoms or polyuria, polydipsia or significant weight change.   For full list of medications, please see face sheet dated April 14, 2007.   PHYSICAL EXAMINATION:  GENERAL:  This is a pleasant, ambulatory, white  male in no acute distress.  VITAL SIGNS:  Stable vital signs except for a blood pressure of 98/60  (without any orthostatic symptoms).  HEENT:  Unremarkable.  Pharynx clear.  LUNGS:  Completely clear bilaterally to auscultation and percussion.  HEART:  Regular rate and rhythm without murmurs, rubs or gallops.  ABDOMEN:  Soft and benign.  EXTREMITIES:  Warm without calf tenderness.  No clubbing, cyanosis or  edema.   IMPRESSION:  Hypertension may be overcontrolled at this point, but he is  only on 10 mg of Benicar per day and denies any orthostatic symptoms.  He is on Benicar both for hypertension and to prevent renal sequelae  from diabetes, but it might be worth considering switching him over to  Avapro 150 one-half daily if the blood pressure continues so low.   He is due for a comprehensive health care evaluation which I have asked  him to schedule within the next 6 weeks.  We will see him sooner if  needed.     Charlaine Dalton. Sherene Sires, MD, [facility]  Electronically Signed    MBW/MedQ  DD: 04/14/2007  DT: 04/15/2007  Job #: [id number]

## 2010-11-24 NOTE — Assessment & Plan Note (Signed)
[facility]                               PULMONARY OFFICE NOTE   DELOIS, TOLBERT                  MRN:          [id number]  DATE:03/28/2006                            DOB:          24-Aug-1950    PRIMARY SERVICE LAST FOLLOWUP OFFICE VISIT:   HISTORY OF PRESENT ILLNESS:  A 60 year old white male with diabetes and  intermittent numbness in his left hand extending to his forearm.  He also  has intermittent pain in his left foot, like a blister when he walks on it  a lot.  He is diabetic.  Comes in for follow up and evaluation of this plus  hyperlipidemia and hypertension.   He denies any overt symptoms of diabetes or hypoglycemia.  Also denies any  TI or claudication symptoms, chest pain, fever, chills, orthopnea, PND or  leg swelling.   MEDICATIONS:  For full inventory of medications, please see column dated  March 28, 2006.   PHYSICAL EXAMINATION:  GENERAL:  Pleasant, ambulatory white male in no acute  distress.  VITAL SIGNS:  Stable.  Weight 214 pounds.  No change on visit.  HEENT:  Unremarkable.  NECK:  Clear.  LUNGS:  Lung fields revealed bilaterally.  HEART:  Regular rhythm without murmurs, gallops, rubs.  ABDOMEN:  Soft, benign.  EXTREMITIES:  Warm without calf tenderness, cyanosis, clubbing or edema.  The left wrist and the left foot were examined in detail and are normal to  inspection, range of motion and also normal neurovascularly.   IMPRESSION:  1. 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  His weight continues to be excessive      and has not been addressed long-term, despite nutrition evaluation.  We      will need to recheck hemoglobin A1C and fasting B-met on his present      regimen.  2. Hyperlipidemia, with goal LDL less than 100 because of his      hyperlipidemia.  3. Left hand numbness, probably as form of carpal tunnel syndrome related      to obesity and diabetes.  I have offered to refer him to a hand  specialist, but he declines at this point stating the problem is not      that bad.  He will call me if he wants referral to Dr. Stark Jock group.  4. Food pain with negative exam, probably represents nothing more than      being on his feet too much.  I have recommended inserts and comfortable      shoes and given him guidelines to look for skin issues that could put      him at risk of diabetic ulceration, but does not appear to be a problem      at present.  Would have a low threshold, but refer him to podiatry for      evaluation if the problem worsens at all.   FOLLOWUP:  He is due for a comprehensive healthcare evaluation in December  2007.  Charlaine Dalton. Sherene Sires, MD, [facility]   MBW/MedQ  DD:  03/28/2006  DT:  03/29/2006  Job #:  [id number]

## 2010-11-24 NOTE — Assessment & Plan Note (Signed)
[facility]                             PULMONARY OFFICE NOTE   [name] Little, [name] Little                  MRN:          [id number]  DATE:10/15/2006                            DOB:          08/08/1950    HISTORY OF PRESENT ILLNESS:  Patient is a 60 year old white male patient  of Dr. Sherene Little who has a known history of rhinitis, hypertension, and  diabetes mellitus.  Presents today for a 2-week followup and medication  review.  Patient has diabetes, and is currently maintained on Glucovance  500/2.5 one half tablet twice daily.  A1c last visit showed elevated  blood sugar at 203, and an A1c elevated up from 8 to 8.2.  Patient  reports that he is unable to take a whole tablet due to low blood  sugars.  Patient does work 3rd shift, and his meal times are quite  different.  Patient denies any chest pain, shortness of breath,  orthopnea, polyuria, polydipsia.  Last visit patient's blood pressure  had been on the lower side, and patient's Benicar was decreased down to  1/2 tablet daily.  Patient reports he has been tolerating well.   PAST MEDICAL HISTORY:  Reviewed.   CURRENT MEDICATIONS:  Reviewed.   PHYSICAL EXAMINATION:  Patient is a pleasant male, in no acute distress.  He is afebrile with stable vital signs.  HEENT:  Unremarkable.  NECK:  Supple without cervical adenopathy.  No JVD.  LUNG SOUNDS:  Clear.  CARDIAC:  Regular rate.  ABDOMEN:  Soft and non-tender.  EXTREMITIES:  Warm without any edema.   DATA:  Blood sugar 203.  BUN and creatinine 19 and 0.9 respectively.  Liver enzymes normal.  Cholesterol 124, triglycerides 52, LDL 66.  A1c  8.2.   IMPRESSION AND PLAN:  1. Poorly controlled diabetes mellitus.  I discussed in detail      patient's daily routine.  Patient will increase the Glucovance up      to 1/2 tablet at his 11 p.m. meal, 1/2 tablet at his 4 a.m. meal,      and a whole tablet with his 11 a.m. meal.  He will continue on  Actos 45 mg.  He is encouraged on increasing his activity with      walking daily, and also to decrease his sweets and portion intake.      Patient is recommended to eat smaller more frequent meals with      decreased portions.  Patient will recheck here in 4 to 6 weeks for      a blood sugar log.  2. Hyperlipidemia.  Currently well controlled, and an LDL goal of less      than 70.  3. Hypertension.  Currently optimally controlled, even with lower dose      Benicar.  4. Complex medication regimen.  Patient's medications reviewed in      detail.  Patient education      was provided, and a computerized medication calendar was adjusted      and reviewed with patient.  Patient is aware to bring this back to      each  and every visit.      Rubye Oaks, NP  Electronically Signed      Charlaine Dalton. [name] Sires, MD, [facility]  Electronically Signed   TP/MedQ  DD: 10/16/2006  DT: 10/16/2006  Job #: [id number]

## 2010-11-24 NOTE — Op Note (Signed)
NAME:  [name] Little, [name] Little NO.:  [id number]   MEDICAL RECORD NO.:  [id number]          PATIENT TYPE:  AMB   LOCATION:  [facility]SC                          FACILITY:  [facility]   PHYSICIAN:  Loreta Ave, M.D. DATE OF BIRTH:  1950-07-17   DATE OF PROCEDURE:  03/07/2005  DATE OF DISCHARGE:                                 OPERATIVE REPORT   PREOPERATIVE DIAGNOSIS:  Right shoulder rotator cuff tear with improvement  and distal clavicle osteolysis.   POSTOPERATIVE DIAGNOSIS:  Right shoulder rotator cuff tear with improvement  and distal clavicle osteolysis, with also anterior labrum tear.   PROCEDURES:  1.  Right shoulder examination under anesthesia, arthroscopy, debridement of      rotator cuff and labrum.  2.  Acromioplasty with coracoacromial ligament release.  3.  Excision of distal clavicle.  4.  Open repair of rotator cuff tear with debridement and then repair with      Fibrewire suture and Concept repair system.   SURGEON:  Loreta Ave, M.D.   ASSISTANT:  Genene Churn. Denton Meek.   ANESTHESIA:  General.   ESTIMATED BLOOD LOSS:  Minimal.   SPECIMENS:  None.   CULTURES:  None.   COMPLICATIONS:  None.   DRESSING:  Soft compressive with shoulder immobilizer.   PROCEDURE:  Patient brought to the operating room and after adequate  anesthesia had been obtained, the right shoulder examined.  Full motion and  good stability.  Placed in a beach chair position on a shoulder positioner  and prepped and draped in the usual sterile fashion.  Three portals created,  one each anterior, posterior and lateral.  Shoulder entered with a blunt  obturator, distended and inspected with the arthroscope.  Complex tearing of  the anterior labrum debrided.  Biceps tendon, a little hypermobility at the  attachment but not a true SLAP lesion.  Capsular and ligamentous structures  intact.  Articular cartilage looked good.  Extensive undersurface tearing  and marked thinning, entire  supraspinatus tendon and crescent region.  Debrided from below.  Still a thin capsule intact throughout the crescent  region but not very functional.  Cannula redirected subacromially.  Confirmation of tearing of the entire supraspinatus tendon from above, just  leaving a thin capsule at the lateral attachment and nonfunctional.  Remaining tendons intact.  Type 2-3 acromion and chronic impingement.  Bursa  resected.  Acromioplasty to a type 1 acromion.  Distal clavicle with grade 3  and 4 chondromalacia.  Lateral centimeter and periarticular spurs dissected.  Adequacy of decompression and clavicle excision confirmed viewing from all  portals.  Instruments and fluid removed.  A deltoid-splitting incision  through the lateral portal.  Subacromial space accessed.  Adequacy of  decompression confirmed.  The lateral markedly thin margin supraspinatus  debrided, the cuff mobilized.  Captured with weaved Fibrewire sutures that  were extended well medial to capture the cuff.  Bony troughs confirmed in  the humerus for the attachment.  A series of drill holes made there with the  Concept repair system.  Sutures weaved through the bony tunnels and then  firmly tied  over the lateral border with sutures going through the bony  tunnels.  This yielded a nice, firm watertight closure of the cuff without  undue tension even with full passive motion.  The wound irrigated.  Retractors removed.  Deltoid and skin closed with Vicryl.  Portals closed  with nylon.  Margins of the wound injected with Marcaine.  Sterile  compressive dressing applied.  Shoulder immobilizer applied.  Anesthesia  reversed.  Brought to the recovery room.  Tolerated the surgery well with no  complications.      Loreta Ave, M.D.  Electronically Signed     DFM/MEDQ  D:  03/07/2005  T:  03/07/2005  Job:  [id number]

## 2010-11-24 NOTE — Assessment & Plan Note (Signed)
[facility]                             PULMONARY OFFICE NOTE   [name] Little, [name] Little                  MRN:          [id number]  DATE:06/18/2006                            DOB:          February 16, 1951    HISTORY OF PRESENT ILLNESS:  Patient is a 60 year old white male patient  of Dr. Thurston Hole with a known history of hypertension, hyperlipidemia, and  diabetes mellitus presents for a 1 week followup.  Patient has a history  of diabetes, and hemoglobin A1c had worsened from 7.1 up to 8.0 despite  being on Glucovance twice daily and Actos daily.  Patient reports sugar  to have been averaging somewhere around 140-160 at home.  Patient denies  any chest pain, shortness of breath, abdominal pain or nausea, vomiting,  polydipsia, polyuria. Patient had been complaining of some fatigue and  blood pressure was low at last visit.  Benicar was cut down to a half a  tablet.  Patient is tolerating it well.   PAST MEDICAL HISTORY:  Reviewed.   MEDICATIONS:  Review physical exam.   Patient is a pleasant male, no acute distress.  He is afebrile, stable  vital signs.  Blood pressure is 138/86.  O2 saturation is 96% on room  air.  HEENT:  Unremarkable.  NECK:  Supple, without adenopathy.  Lung sounds are clear.  CARDIAC:  Regular rate and rhythm.  ABDOMEN:  Soft and benign.  EXTREMITIES:  Without any edema.   DATA:  Hemoglobin 14.9, total cholesterol 112, LDL 60, HDL 38.  Hemoglobin 14.9, A1c 8.0.  Blood sugar at 180.  TSH 1.95, CRP 1.0.   PLAN:  1. Diabetes mellitus, currently not at goal.  Patient will increase      Glucovance up to 1 tablet twice daily.  Continue on Actos.  Patient      is advised on dietary measures.  Patient will be rechecked in 3      months or sooner if needed.  2. Hypertension with recent low blood pressures, now on lower dose of      Benicar tolerating well with increased energy level.  Blood      pressure is marginally controlled  today.  Patient will follow back      up in 3 months.  Patient is advised on exercise and weight loss.  3. Hyperlipidemia, currently at goal on Advicor with total cholesterol      of 112 and an LDL of 60.  4. Complex medication regimen.  Patient's computerized medication list      was reviewed.  Patient education sheet was provided and      computerized med list was adjusted accordingly.      [name] Oaks, NP  Electronically Signed      Charlaine Dalton. Sherene Sires, MD, [facility]  Electronically Signed   TP/MedQ  DD: 06/18/2006  DT: 06/18/2006  Job #: [id number]

## 2010-11-24 NOTE — Assessment & Plan Note (Signed)
[facility]                             PULMONARY OFFICE NOTE   [name] Little, GARRELTS                  MRN:          [id number]  DATE:06/10/2006                            DOB:          1951/02/12    HISTORY:  A 60 year old white male with morbid obesity, complicated by  diabetes since 1998, hypertension and hyperlipidemia.  He also has  intermittent asthma and comes in today complaining of variable dyspnea  with exertion with no significant orthopnea, PND, leg swelling, fever,  chills, sweats, excessive sputum production.  He denies any exertional  chest pain, orthopnea, GI, or claudication symptoms.   In trying to do medication reconciliation, I realized the patient is in  an unusual situation where he actually takes his medicines the way he  would on his night shift regardless of whether he is on the night sift  or not.  That is, he normally goes to bed at 4 p.m. and, therefore, his  bedtime doses continue to be 4 p.m. dosing even when he is off, which  will be true from now through the rest of the first of the year.   PAST MEDICAL HISTORY:  1. Obesity with target weight less than 171.  2. Diabetes mellitus since 1998 secondary to obesity.  3. Hyperlipidemia with target LDL less than 100 because of diabetes.  4. Chronic rhinitis with nonspecific features and also intermittent      asthma symptoms not requiring chronic therapy.  5. Degenerative joint disease involving the left knee and right      shoulder, status post right rotator cuff surgery August 2006.   MEDICATIONS:  Taken and these are on the worksheet, corrected on columns  dated June 10, 2006, although note problems with timing as noted  above.   SOCIAL HISTORY:  He quit smoking seven years ago. Denies any alcohol  use.  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  He presently is on an extended leave until after the  first of the  year.   FAMILY HISTORY:  Positive for diabetes in his mother.  Head and neck  cancer in his brother who was a smoker.  Father had emphysema and was a  smoker.  There was no other cancer in his family to his knowledge  including colon cancer or premature heart disease.   REVIEW OF SYSTEMS:  Taken in detail on the worksheet, significant for  the problems as outlined above.   PHYSICAL EXAMINATION:  GENERAL:  This is a jovial, ambulatory, obese  white male in no acute distress.  VITAL SIGNS:  Weight 215 pounds.  Was up another four pounds from  previous visit from a year ago.  HEENT:  Unremarkable.  Oropharynx is clear.  Ear canals were full of wax  bilaterally.  Dentition is intact.  NECK:  Supple without cervical adenopathy or tenderness.  Trachea  midline. No thyromegaly.  Carotid upstrokes brisk, no bruits.  CHEST:  Completely clear bilaterally to auscultation and percussion.  HEART:  Regular rhythm without murmur, gallop or rub.  There was  no  displacement of  PMI.  ABDOMEN:  Obese but otherwise soft, benign without organomegaly, masses  or tenderness.  No bruits appreciated over the abdomen or femoral  arteries.  GENITOURINARY:  Testes descend bilaterally.  No nodules.  RECTAL:  Mild BPH with smooth texture.  Stool guaiac was negative.  EXTREMITIES:  Warm without calf tenderness, cyanosis or clubbing or  edema.  There were mild venous stasis changes bilaterally.  NEUROLOGIC:  No focal deficits or pathological reflexes.  Deep tendon  reflexes were normal in the proximal and distal extremities.   LABORATORY DATA:  EKG was normal.  Chemistry profile revealed a fasting  blood sugar of 180 with a hemoglobin A1C of 8. TSH was normal.  CRP was  normal.  LDL cholesterol was 60 with an HDL of 38.  CBC was normal.   IMPRESSION:  1. Morbid obesity complicated by worsening diabetes despite treatment      with Glucovance and Actos.  I believe the Glucovance will need to      be increased  when he returns for followup and also I would strongly      urge this patient to see a dietitian with a food diary with concern      that he is progressing away from his stated goal of 171 pounds and      shows very little insight into this issue from a calorie balance      perspective despite multiple conversations regarding this and also      formal [facility] nutrition referral in 2001 per his flow sheet.  2. Wax retention.  I have asked him to return in two weeks to see our      nurse practitioner to have his ears irrigated and also review the      lab data above.  3. Hypertension is adequately treated.  If anything, actually he is      overtreated at present and recommend he reduce the dose of Benicar      to one-tablet daily.  4. Hyperlipidemia with LDL at goal on Actos and Advicor.  No change in      therapy needed.  5. Intermittent rhinitis and asthma, well controlled with p.r.n.      medications.  I reviewed his p.r.n. medications with him with good      control of his rhinitis. He really has not had significant asthma      and, therefore, does not need any form of chronic therapy.  Did      review the importance of understanding the p.r.n. medications and  6. Finally, in trying to reconcile his medications, it turned out the      patient is somewhat disorganized in how he takes his medicines and      they do not necessarily correlate to the columns on his previous      medicine calendar.  Rather than use the morning, night and      bedtime in the columns, I think we should probably just list the      actual hour that he takes the medicines because otherwise he gets      confused to what is his morning and bedtime versus what we      understand to be morning and bedtime.  That is, he takes his      medicines at  4 p.m. each day.  They should be listed that way.   I have asked him to return for a full medication  reconciliation and generation of a  new medication calendar on his next  visit which should  include decreasing the Benicar and he  should incorporate the Benicar changes as noted above in the calendar  that he has already been provided but failed showed me today.     Charlaine Dalton. Sherene Sires, MD, [facility]  Electronically Signed    MBW/MedQ  DD: 06/11/2006  DT: 06/11/2006  Job #: [id number]

## 2010-11-24 NOTE — Assessment & Plan Note (Signed)
[facility]                             PULMONARY OFFICE NOTE   JOVANNY, STEPHANIE                  MRN:          [id number]  DATE:10/01/2006                            DOB:          02-Mar-1951    HISTORY:  A 60 year old white male, former smoker with poorly controlled  rhinitis in for followup of hypertension, diabetes, and hyperlipidemia.  He has been having increasing symptoms of nasal congestion with right  ear pain and sore throat for the last several weeks, but has not  implemented the contingency plan to use nasal steroids the way I had  previously expressed this to him in writing.  Please see previous  records.  The most recent discussion of this was July 20, 2004 in the  context of a chronic rhinitis flyer, which he is not following.   His main reason for return is followup office visit for the changes made  on the medication calendar dated June 08, 2006, but apparently he did  not incorporate the change in dose for Glucovance as recommended by our  nurse practitioner, and continues to take 1/2 pill b.i.d. instead of the  1 b.i.d. that was recommended.  He did not bring the calendar with him  to the office.   PHYSICAL EXAMINATION:  He is obese, and is having a little bit of  trouble reconciling the calendar with his unusual schedule of sleeping  from 4 to 11 o'clock when he is working, and then sleeping regular  schedules when he is not working.  He is pleasant.  Has had blood sugars  typically 180 fasting when he wakes up at 11 o'clock at night, but  more like 120 when he wakes up at his usual hour off duty, when he is  not working.  He is an obese, ambulatory, white male who has not made  any headway on weight at 215 pounds.  Blood pressure is 98/64.  HEENT:  Remarkable for moderate nonspecific turbinate edema.  Oropharynx  reveals minimal erythema/cobblestoning with no purulent secretion,  tonsillar enlargement, cervical  adenopathy, or tenderness.  Trachea is  midline.  His ears are normal on exam.  LUNG FIELDS:  Perfectly clear bilaterally to auscultation and  percussion.  HEART:  Regular rhythm without murmur, gallop or rub.  ABDOMEN:  Soft and benign.  EXTREMITIES:  Warm without calf tenderness, cyanosis, clubbing, or  edema.   IMPRESSION:  1. Poorly controlled chronic rhinitis due to non-adherence with      previous specific recommendations regarding the use of Afrin and      nasal steroids, which I reviewed with him again in writing.  2. Hypertension.  Appears overcontrolled, but note the patient has no      orthostatic complaints, and he is diabetic, so he needs to continue      low doses or ARB therapy.  3. Diabetes.  Is not well controlled at present, and part of this may      be related to non-adherence.  Same is true for hyperlipidemia.  I      discussed this issue with him today in terms  of both calorie      balance issues and also full medication reconciliation.   He is going to return here in 2 weeks for a review of his blood work,  and also response to increased efforts at optimal management of chronic  rhinitis.  The blood work will be discussed  with him at that point, and his medicines will be reviewed using a  trust but verify approach.  That is, I have asked him to bring all of his medicines, and the lists  that he was provided for full reconciliation, and adjustments in the  office in writing at that point.     Charlaine Dalton. Sherene Sires, MD, [facility]  Electronically Signed    MBW/MedQ  DD: 10/01/2006  DT: 10/01/2006  Job #: [id number]

## 2011-04-17 ENCOUNTER — Other Ambulatory Visit: Payer: Self-pay | Admitting: Internal Medicine

## 2011-07-04 ENCOUNTER — Telehealth: Payer: Self-pay | Admitting: Internal Medicine

## 2011-07-04 MED ORDER — AZITHROMYCIN 250 MG PO TABS: ORAL_TABLET | ORAL | Status: AC

## 2011-07-04 NOTE — Telephone Encounter (Signed)
Duplicate msg.

## 2011-07-04 NOTE — Telephone Encounter (Signed)
Call in zpak, ov by end of week if not turning corner

## 2011-07-04 NOTE — Telephone Encounter (Signed)
LM for pt TCB

## 2011-07-04 NOTE — Telephone Encounter (Signed)
Spoke with patient has had sinus congestion and drainage with ST,coughing up thick white phlem.  for x1wk was using  Nasonex, but now using afrin for last  3 days with no relief. Denies any sob,fever chest tightness No Known Allergies Pharmacy is [facility] in [place]. Dr Sherene Sires please advise.

## 2011-07-04 NOTE — Telephone Encounter (Signed)
Pt is aware of MW recs. Rx has been sent to pharmacy. Pt needed nothing further

## 2011-08-24 ENCOUNTER — Ambulatory Visit: Payer: 59 | Admitting: Internal Medicine

## 2011-10-10 ENCOUNTER — Ambulatory Visit (INDEPENDENT_AMBULATORY_CARE_PROVIDER_SITE_OTHER): Payer: 59 | Admitting: Internal Medicine

## 2011-10-10 ENCOUNTER — Other Ambulatory Visit (INDEPENDENT_AMBULATORY_CARE_PROVIDER_SITE_OTHER): Payer: 59

## 2011-10-10 ENCOUNTER — Encounter: Payer: Self-pay | Admitting: Internal Medicine

## 2011-10-10 VITALS — BP 130/80 | HR 70 | Temp 97.4°F | Ht 66.0 in | Wt 198.0 lb

## 2011-10-10 DIAGNOSIS — R109 Unspecified abdominal pain: Secondary | ICD-10-CM

## 2011-10-10 DIAGNOSIS — E785 Hyperlipidemia, unspecified: Secondary | ICD-10-CM

## 2011-10-10 DIAGNOSIS — E119 Type 2 diabetes mellitus without complications: Secondary | ICD-10-CM

## 2011-10-10 DIAGNOSIS — I10 Essential (primary) hypertension: Secondary | ICD-10-CM

## 2011-10-10 LAB — BASIC METABOLIC PANEL
BUN: 15 mg/dL (ref 6–23)
Calcium: 9.2 mg/dL (ref 8.4–10.5)
Chloride: 103 mEq/L (ref 96–112)
Creatinine, Ser: 0.9 mg/dL (ref 0.4–1.5)

## 2011-10-10 LAB — HEPATIC FUNCTION PANEL
AST: 19 U/L (ref 0–37)
Albumin: 4.1 g/dL (ref 3.5–5.2)
Total Bilirubin: 0.8 mg/dL (ref 0.3–1.2)

## 2011-10-10 LAB — HEMOGLOBIN A1C: Hgb A1c MFr Bld: 10.5 % — ABNORMAL HIGH (ref 4.6–6.5)

## 2011-10-10 LAB — LIPID PANEL
Cholesterol: 107 mg/dL (ref 0–200)
Triglycerides: 64 mg/dL (ref 0.0–149.0)
VLDL: 12.8 mg/dL (ref 0.0–40.0)

## 2011-10-10 NOTE — Assessment & Plan Note (Signed)

## 2011-10-10 NOTE — Assessment & Plan Note (Signed)
Not Adequate control on present rx, reviewed  Options - for next 3 months needs to try max meds 2 bid glucovance

## 2011-10-10 NOTE — Patient Instructions (Signed)
Please remember to go to the lab  department downstairs for your tests - we will call you with the results when they are available.  Citrucel 1 tsp twice daily with large glass of water and if not better within two please call  See calendar for specific medication instructions and bring it back for each and every office visit for every healthcare provider you see.  Without it,  you may not receive the best quality medical care that we feel you deserve.  You will note that the calendar groups together  your maintenance  medications that are timed at particular times of the day.  Think of this as your checklist for what your doctor has instructed you to do until your next evaluation to see what benefit  there is  to staying on a consistent group of medications intended to keep you well.  The other group at the bottom is entirely up to you to use as you see fit  for specific symptoms that may arise between visits that require you to treat them on an as needed basis.  Think of this as your action plan or "what if" list.   Separating the top medications from the bottom group is fundamental to providing you adequate care going forward.    Please schedule a follow up visit in 3 months but call sooner if needed for CPX

## 2011-10-10 NOTE — Progress Notes (Signed)
Subjective:    Patient ID: [name] Little, male    DOB: 1951-06-01    MRN: [id number]  Brief patient profile:  60  yowm quit smoking 2000 with morbid obesity achieving a peak all-time weight of 228 complicated by hypertension and diabetes.   July 01, 2008 cpx c/o increase fbs 170 but no symptoms of polyuria, polydypsia, hgba1c 8 rec diet, ex, nutrition eval.    Nov 22, 2009 Followup fasting for labs. Not following med calendar. Pt c/o lower right side abdominal pain x 2 wks "off and on". He states that pain feels achy and sometimes sharp. lasts few secs better after passes gas. migrates around but most severe rlq, no rad to flank or scrotum, no dysuria or hematuria. --Labs showed A1C 9.0, glucovance was increased to three times a day. rec citrucel  > pain resolved  January 05, 2010--Presents for follow up and med review. He has been doing ok. BS averaging 90-180, he has had only 1 low sugar at 75 when he did not eat. We discussed diet and exercise /weight loss. We reveiwed his meds and organized his meds into a med calendar. rec Stop Actos  Continue on Glucovance 2.5mg /500mg  three times a day  Diabetic diet.  Increase activity as tolerated.  Follow med calendar closely and bring to each vist     2 April 11, 2010 Followup. No med calendar. Cc c/p rt side pain- the same, no better or worse. He states that sometimes after eating feels the need to belch but states this is hard to do. not taking citrucel regulary,  , pain migratory and positional. no anorexia nause or change in bowel/bladder habits rec citrucel > resolved then stopped citrucel  10/18/2010 ov with med calendar in hand but not following, doing cbgs at various times instead of fasting as rec. No overt symptoms of hyper or hypoglycemia.  Pt denies any significant sore throat, dysphagia, itching, sneezing,  nasal congestion or excess/ purulent secretions,  fever, chills, sweats, unintended wt loss, pleuritic or exertional cp, hempoptysis,  orthopnea pnd or leg swelling.  No abd pain.   rec Use med calendar Stop advicor when you finish it and replace it simcor   Please schedule a follow up visit in 3 months but call sooner if needed for CPX but do not need fasting  Late add:  Needs to move up Cpx with all meds in hand due to hgb a1c drifting up  10/10/2011 f/u ov/[name] Little here for f/u dm/hbp ccrecurrent x one month intermittent  R flank pain positional worse when lie on that side then resolves immediately when rolls to other side.  Not taking citrucel any more at all. No assoc nausea or change in painwith bowel or bladder function. Very similar to previous cp that resolved on citrucel. No pleuritic component.  Fasting cbg's running 140-180 no overt symptoms of low or high blood sugar.  Sleeping ok without nocturnal  or early am exacerbation  of respiratory  c/o's  Also denies any obvious fluctuation of symptoms with weather or environmental changes or other aggravating or alleviating factors except as outlined above.  ROS  At present neg for  any significant sore throat, dysphagia, dental problems, itching, sneezing,  nasal congestion or excess/ purulent secretions, ear ache,   fever, chills, sweats, unintended wt loss, pleuritic or exertional cp, hemoptysis, palpitations, orthopnea pnd or leg swelling.  Also denies presyncope, palpitations, heartburn, abdominal pain, anorexia, nausea, vomiting, diarrhea  or change in bowel or urinary habits, change in  stools or urine, dysuria,hematuria,  rash, arthralgias, visual complaints, headache, numbness weakness or ataxia or problems with walking or coordination. No noted change in mood/affect or memory.                      Past Medical History:  ACTINIC KERATOSIS (ICD-702.0)  Referred to [facility] 06/27/2007  PAIN IN JOINT, SITE UNSPECIFIED (ICD-719.40)...............................................[place]  HYPERLIPIDEMIA (ICD-272.4)  - Target LDL < 70 due to DM  OBESITY NOS  (ICD-278.00)  - target weight 190, ideal < 171, peak 228 October 21, 2007  - REC: return to nutrition with food diary 07/01/08 > declined referral  DM (ICD-250.00)  A1C 9.0>8.0 01/05/10 Glucovance 2.5mg  /500mg  three times a day , Actos stopped > 9.2 April 11, 2010  HYPERTENSION (ICD-401.9)  HEALTH MAINTENANCE.....................................................................Marland KitchenWert  - Pneumovax 05/2005 - Td 06/27/2007 - Colonoscopy letter sent 09/19/06, reviewed with pt 07/01/08> > referred again October 03, 2009 >did not make appt - CPX October 03, 2009                     Objective:   Physical Exam  Ambulatory healthy appearing obese white male in no acute distress.  wt 227 07/01/08 > 223 May 30, 2009  > 209 10/18/2010 > 10/10/2011  198 HEENT: nl dentition, and orophanx. mod non-specific turbinate edema. Nl external ear canals without cough reflex  Neck without JVD/Nodes/TM. Carotid upstrokes brsk with no bruits.  Lungs insp and exp rhonchi with exp cough, barking quality, mostly dry  RRR no s3 or murmur or increase in P2. No edema  Abd soft and benign with nl excursion in the supine position. No bruits or organomegaly  Ext warm without calf tenderness, cyanosis clubbing       Assessment & Plan:

## 2011-10-10 NOTE — Progress Notes (Signed)
Quick Note:  Spoke with pt and notified of results per Dr. Wert. Pt verbalized understanding and denied any questions.  ______ 

## 2011-10-10 NOTE — Assessment & Plan Note (Signed)
Adequate control on present rx, reviewed  

## 2011-10-14 ENCOUNTER — Other Ambulatory Visit: Payer: Self-pay | Admitting: Internal Medicine

## 2011-10-19 ENCOUNTER — Other Ambulatory Visit: Payer: Self-pay | Admitting: Internal Medicine

## 2011-11-30 ENCOUNTER — Other Ambulatory Visit: Payer: Self-pay | Admitting: Internal Medicine

## 2012-01-14 ENCOUNTER — Encounter: Payer: 59 | Admitting: Internal Medicine

## 2012-01-24 ENCOUNTER — Encounter: Payer: Self-pay | Admitting: Internal Medicine

## 2012-01-24 ENCOUNTER — Ambulatory Visit (INDEPENDENT_AMBULATORY_CARE_PROVIDER_SITE_OTHER)
Admission: RE | Admit: 2012-01-24 | Discharge: 2012-01-24 | Disposition: A | Discharge status: Home / Self Care | Payer: 59 | Source: Ambulatory Visit | Attending: Internal Medicine | Admitting: Internal Medicine

## 2012-01-24 ENCOUNTER — Other Ambulatory Visit (INDEPENDENT_AMBULATORY_CARE_PROVIDER_SITE_OTHER): Payer: 59

## 2012-01-24 ENCOUNTER — Other Ambulatory Visit: Payer: Self-pay | Admitting: Internal Medicine

## 2012-01-24 ENCOUNTER — Ambulatory Visit (INDEPENDENT_AMBULATORY_CARE_PROVIDER_SITE_OTHER): Payer: 59 | Admitting: Internal Medicine

## 2012-01-24 VITALS — BP 100/64 | HR 70 | Temp 97.4°F | Ht 68.0 in | Wt 199.0 lb

## 2012-01-24 DIAGNOSIS — N400 Enlarged prostate without lower urinary tract symptoms: Secondary | ICD-10-CM

## 2012-01-24 DIAGNOSIS — E119 Type 2 diabetes mellitus without complications: Secondary | ICD-10-CM

## 2012-01-24 DIAGNOSIS — E669 Obesity, unspecified: Secondary | ICD-10-CM

## 2012-01-24 DIAGNOSIS — I10 Essential (primary) hypertension: Secondary | ICD-10-CM

## 2012-01-24 DIAGNOSIS — Z0000 Encounter for general adult medical examination without abnormal findings: Secondary | ICD-10-CM

## 2012-01-24 DIAGNOSIS — Z789 Other specified health status: Secondary | ICD-10-CM

## 2012-01-24 DIAGNOSIS — E785 Hyperlipidemia, unspecified: Secondary | ICD-10-CM

## 2012-01-24 LAB — BASIC METABOLIC PANEL
GFR: 110.81 mL/min (ref >60.00)
Potassium: 3.9 mEq/L (ref 3.5–5.1)
Sodium: 135 mEq/L (ref 135–145)

## 2012-01-24 LAB — MICROALBUMIN / CREATININE URINE RATIO
Creatinine,U: 79.9 mg/dL
Microalb, Ur: 0.4 mg/dL (ref 0.0–1.9)

## 2012-01-24 LAB — URINALYSIS
Leukocytes, UA: NEGATIVE
Nitrite: NEGATIVE
Specific Gravity, Urine: 1.02 (ref 1.000–1.030)
pH: 5.5 (ref 5.0–8.0)

## 2012-01-24 MED ORDER — GLYBURIDE-METFORMIN 2.5-500 MG PO TABS: ORAL_TABLET | ORAL | Status: DC

## 2012-01-24 NOTE — Assessment & Plan Note (Signed)
Not Adequate control on present rx, reviewed options > increase glucovance to total of 3 and one half daily and regroup in 3 months

## 2012-01-24 NOTE — Assessment & Plan Note (Addendum)
-   Target LDL < 70 due to aodm  Adequate control on present rx, reviewed   Lab Results  Component Value Date   LDLCALC 55 10/10/2011

## 2012-01-24 NOTE — Progress Notes (Signed)
Quick Note:  Spoke with pt and notified of results per Dr. Wert. Pt verbalized understanding and denied any questions.  ______ 

## 2012-01-24 NOTE — Progress Notes (Signed)
Subjective:    Patient ID: [name] Little, male    DOB: [birth year]    MRN: [id number]  Brief patient profile:  57  yowm quit smoking 2000 with morbid obesity achieving a peak all-time weight of 228 complicated by hypertension and diabetes.    Nov 22, 2009 Followup fasting for labs. Not following med calendar. Pt c/o lower right side abdominal pain x 2 wks "off and on". He states that pain feels achy and sometimes sharp. lasts few secs better after passes gas. migrates around but most severe rlq, no rad to flank or scrotum, no dysuria or hematuria. --Labs showed A1C 9.0, glucovance was increased to three times a day. rec citrucel  > pain resolved  January 05, 2010--Presents for follow up and med review. He has been doing ok. BS averaging 90-180, he has had only 1 low sugar at 75 when he did not eat. We discussed diet and exercise /weight loss. We reveiwed his meds and organized his meds into a med calendar. rec Stop Actos  Continue on Glucovance 2.5mg /500mg  three times a day  Diabetic diet.  Increase activity as tolerated.  Follow med calendar closely and bring to each vist     2 April 11, 2010 Followup. No med calendar. Cc c/p rt side pain- the same, no better or worse. He states that sometimes after eating feels the need to belch but states this is hard to do. not taking citrucel regulary,  , pain migratory and positional. no anorexia nause or change in bowel/bladder habits rec citrucel > resolved then stopped citrucel  10/18/2010 ov with med calendar in hand but not following, doing cbgs at various times instead of fasting as rec. No overt symptoms of hyper or hypoglycemia.  Pt denies any significant sore throat, dysphagia, itching, sneezing,  nasal congestion or excess/ purulent secretions,  fever, chills, sweats, unintended wt loss, pleuritic or exertional cp, hempoptysis, orthopnea pnd or leg swelling.  No abd pain.   rec Use med calendar Stop advicor when you finish it and replace it  simcor Late add:  Needs to move up Cpx with all meds in hand due to hgb a1c drifting up  10/10/2011 f/u ov/[name] Little here for f/u dm/hbp cc recurrent x one month intermittent  R flank pain positional worse when lie on that side then resolves immediately when rolls to other side.  Not taking citrucel any more at all. No assoc nausea or change in painwith bowel or bladder function. Very similar to previous cp that resolved on citrucel. No pleuritic component. Fasting cbg's running 140-180 no overt symptoms of low or high blood sugar. rec Citrucel 1 tsp twice daily with large glass of water and if not better within two please call See calendar for specific medication instructions    01/24/2012 f/u ov/[name] Little for annual eval for multiple chronic problems (dm/hbp/hyperlipidemia)cc abd pain resolved on citrucel w/in a week or two of restarting citrucel.   No unusual cough, purulent sputum or sinus/hb symptoms on present rx.    Sleeping ok without nocturnal  or early am exacerbation  of respiratory  c/o's  Also denies any obvious fluctuation of symptoms with weather or environmental changes or other aggravating or alleviating factors except as outlined above.  ROS  The following are not active complaints unless bolded sore throat, dysphagia, dental problems, itching, sneezing,  nasal congestion or excess/ purulent secretions, ear ache,   fever, chills, sweats, unintended wt loss, pleuritic or exertional cp, hemoptysis,  orthopnea pnd or leg swelling, presyncope,  palpitations, heartburn, abdominal pain, anorexia, nausea, vomiting, diarrhea  or change in bowel or urinary habits, change in stools or urine, dysuria,hematuria,  rash, arthralgias, visual complaints, headache, numbness weakness or ataxia or problems with walking or coordination,  change in mood/affect or memory.         Past Medical History:  ACTINIC KERATOSIS (ICD-702.0)  Referred to [facility] 06/27/2007  PAIN IN JOINT, SITE UNSPECIFIED  (ICD-719.40)...............................................[place]  HYPERLIPIDEMIA (ICD-272.4)  - Target LDL < 70 due to DM  OBESITY NOS (ICD-278.00)  - target weight 190, ideal < 171, peak 228 October 21, 2007  - REC: return to nutrition with food diary 07/01/08 > declined referral  DM (ICD-250.00)  A1C 9.0>8.0 01/05/10 Glucovance 2.5mg  /500mg  three times a day , Actos stopped > 9.2 April 11, 2010  HYPERTENSION (ICD-401.9)  HEALTH MAINTENANCE.....................................................................Marland KitchenWert  - Pneumovax 05/2005 - Td 06/27/2007 - Colonoscopy letter sent 09/19/06, reviewed with pt 07/01/08> > referred again October 03, 2009 >did not make appt > re-ordered 01/24/2012  - CPX 01/24/2012         Objective:   Physical Exam  Ambulatory healthy appearing obese white male in no acute distress.  wt 227 07/01/08 > 223 May 30, 2009  > 209 10/18/2010 > 10/10/2011  198 > 01/24/2012 199 HEENT: nl dentition, and orophanx. mod non-specific turbinate edema. Nl external ear canals without cough reflex  Neck without JVD/Nodes/TM. Carotid upstrokes brsk with no bruits.  Lungs insp and exp rhonchi with exp cough, barking quality, mostly dry  RRR no s3 or murmur or increase in P2. No edema  Abd soft and benign with nl excursion in the supine position. No bruits or organomegaly  Ext warm without calf tenderness, cyanosis clubbing MS nl gait, no restrictions Skin:  Cyst on L pinna x 1 cm diam, mobile and not part of ear cartilage Neuro sensorium intact,  No motor or cerebellar def, nl reflexes GU  Testes down bilaterally, no ih Rectal mild bph, no nodules, stool G neg  CXR  01/24/2012 :  Low lung volumes. No acute or active disease.         Assessment & Plan:

## 2012-01-24 NOTE — Assessment & Plan Note (Signed)
Not Adequate control on present rx, reviewed > pt has declined referral to nutrition.

## 2012-01-24 NOTE — Assessment & Plan Note (Signed)
Adequate control on present rx, reviewed  

## 2012-01-24 NOTE — Patient Instructions (Addendum)
Please remember to go to the lab and x-ray department downstairs for your tests - we will call you with the results when they are available.     See calendar for specific medication instructions and bring it back for each and every office visit for every healthcare provider you see.  Without it,  you may not receive the best quality medical care that we feel you deserve.  You will note that the calendar groups together  your maintenance  medications that are timed at particular times of the day.  Think of this as your checklist for what your doctor has instructed you to do until your next evaluation to see what benefit  there is  to staying on a consistent group of medications intended to keep you well.  The other group at the bottom is entirely up to you to use as you see fit  for specific symptoms that may arise between visits that require you to treat them on an as needed basis.  Think of this as your action plan or "what if" list.   Separating the top medications from the bottom group is fundamental to providing you adequate care going forward.    Please see patient coordinator before you leave today  to schedule GI referral for colonoscopy   Please schedule a follow up visit in 3 months but call sooner if needed

## 2012-03-03 ENCOUNTER — Encounter: Payer: Self-pay | Admitting: Internal Medicine

## 2012-03-03 ENCOUNTER — Ambulatory Visit (AMBULATORY_SURGERY_CENTER): Payer: 59

## 2012-03-03 VITALS — Ht 68.0 in | Wt 195.0 lb

## 2012-03-03 DIAGNOSIS — Z1211 Encounter for screening for malignant neoplasm of colon: Secondary | ICD-10-CM

## 2012-03-03 MED ORDER — NA SULFATE-K SULFATE-MG SULF 17.5-3.13-1.6 GM/177ML PO SOLN: 1.0000 | Freq: Once | ORAL | Status: AC

## 2012-03-17 ENCOUNTER — Encounter: Payer: 59 | Admitting: Internal Medicine

## 2012-04-09 ENCOUNTER — Encounter: Payer: Self-pay | Admitting: *Deleted

## 2012-04-09 ENCOUNTER — Ambulatory Visit (INDEPENDENT_AMBULATORY_CARE_PROVIDER_SITE_OTHER): Payer: 59 | Admitting: Internal Medicine

## 2012-04-09 ENCOUNTER — Encounter: Payer: Self-pay | Admitting: Internal Medicine

## 2012-04-09 ENCOUNTER — Other Ambulatory Visit (INDEPENDENT_AMBULATORY_CARE_PROVIDER_SITE_OTHER): Payer: 59

## 2012-04-09 ENCOUNTER — Ambulatory Visit (INDEPENDENT_AMBULATORY_CARE_PROVIDER_SITE_OTHER)
Admission: RE | Admit: 2012-04-09 | Discharge: 2012-04-09 | Disposition: A | Discharge status: Home / Self Care | Payer: 59 | Source: Ambulatory Visit | Attending: Internal Medicine | Admitting: Internal Medicine

## 2012-04-09 ENCOUNTER — Other Ambulatory Visit: Payer: Self-pay | Admitting: Internal Medicine

## 2012-04-09 VITALS — BP 118/68 | HR 90 | Temp 98.9°F | Ht 67.0 in | Wt 194.2 lb

## 2012-04-09 DIAGNOSIS — J189 Pneumonia, unspecified organism: Secondary | ICD-10-CM

## 2012-04-09 DIAGNOSIS — R05 Cough: Secondary | ICD-10-CM

## 2012-04-09 DIAGNOSIS — J310 Chronic rhinitis: Secondary | ICD-10-CM

## 2012-04-09 DIAGNOSIS — E119 Type 2 diabetes mellitus without complications: Secondary | ICD-10-CM

## 2012-04-09 DIAGNOSIS — I10 Essential (primary) hypertension: Secondary | ICD-10-CM

## 2012-04-09 LAB — BASIC METABOLIC PANEL
BUN: 15 mg/dL (ref 6–23)
CO2: 27 mEq/L (ref 19–32)
Chloride: 101 mEq/L (ref 96–112)
GFR: 83.56 mL/min (ref >60.00)
Glucose, Bld: 221 mg/dL — ABNORMAL HIGH (ref 70–99)
Potassium: 4.4 mEq/L (ref 3.5–5.1)

## 2012-04-09 MED ORDER — DOXYCYCLINE HYCLATE 100 MG PO TABS: 100.0000 mg | ORAL_TABLET | Freq: Two times a day (BID) | ORAL | Status: DC

## 2012-04-09 NOTE — Progress Notes (Signed)
Quick Note:  Spoke with pt and notified of results per Dr. Wert. Pt verbalized understanding and denied any questions.  ______ 

## 2012-04-09 NOTE — Assessment & Plan Note (Signed)
Adequate control on present rx, reviewed  

## 2012-04-09 NOTE — Assessment & Plan Note (Signed)
Extended discussion with pt and wife and son> will need much more attention to detail with diet and meds (did not bring calendar) to address this longterm or consider going back to nutrition with food diary (which he declined) or allow me to refer to dm specialist

## 2012-04-09 NOTE — Assessment & Plan Note (Signed)
LLL bronchopneumonia apparent assoc with lots of atypical symptoms but does not look toxic at all today > rx with doxy x 10 days with low threshold to change to levaquin if not improving.

## 2012-04-09 NOTE — Patient Instructions (Addendum)
Please remember to go to x-ray department downstairs for your tests - we will call you with the results when they are available.  Doxycline 100 mg twice daily x 10 days  Mucinex dm for cough as per your calendar  Keep previous appt for Oct 22 - call sooner if needed, ok to resume work on Sunday 10/6

## 2012-04-09 NOTE — Progress Notes (Signed)
Subjective:    Patient ID: [name] Little, male    DOB: [birth year]    MRN: [id number]  Brief patient profile:  3  yowm quit smoking 2000 with morbid obesity achieving a peak all-time weight of 228 complicated by hypertension and diabetes.    Nov 22, 2009 Followup fasting for labs. Not following med calendar. Pt c/o lower right side abdominal pain x 2 wks "off and on". He states that pain feels achy and sometimes sharp. lasts few secs better after passes gas. migrates around but most severe rlq, no rad to flank or scrotum, no dysuria or hematuria. --Labs showed A1C 9.0, glucovance was increased to three times a day. rec citrucel  > pain resolved  January 05, 2010--Presents for follow up and med review. He has been doing ok. BS averaging 90-180, he has had only 1 low sugar at 75 when he did not eat. We discussed diet and exercise /weight loss. We reveiwed his meds and organized his meds into a med calendar. rec Stop Actos  Continue on Glucovance 2.5mg /500mg  three times a day  Diabetic diet.  Increase activity as tolerated.  Follow med calendar closely and bring to each vist     2 April 11, 2010 Followup. No med calendar. Cc c/p rt side pain- the same, no better or worse. He states that sometimes after eating feels the need to belch but states this is hard to do. not taking citrucel regulary,  , pain migratory and positional. no anorexia nause or change in bowel/bladder habits rec citrucel > resolved then stopped citrucel  10/18/2010 ov with med calendar in hand but not following, doing cbgs at various times instead of fasting as rec. No overt symptoms of hyper or hypoglycemia.  Pt denies any significant sore throat, dysphagia, itching, sneezing,  nasal congestion or excess/ purulent secretions,  fever, chills, sweats, unintended wt loss, pleuritic or exertional cp, hempoptysis, orthopnea pnd or leg swelling.  No abd pain.   rec Use med calendar Stop advicor when you finish it and replace it  simcor Late add:  Needs to move up Cpx with all meds in hand due to hgb a1c drifting up  10/10/2011 f/u ov/Gerica Koble here for f/u dm/hbp cc recurrent x one month intermittent  R flank pain positional worse when lie on that side then resolves immediately when rolls to other side.  Not taking citrucel any more at all. No assoc nausea or change in painwith bowel or bladder function. Very similar to previous cp that resolved on citrucel. No pleuritic component. Fasting cbg's running 140-180 no overt symptoms of low or high blood sugar. rec Citrucel 1 tsp twice daily with large glass of water and if not better within two please call See calendar for specific medication instructions    01/24/2012 f/u ov/Deniece Rankin for annual eval for multiple chronic problems (dm/hbp/hyperlipidemia)cc abd pain resolved on citrucel w/in a week or two of restarting citrucel.   No unusual cough, purulent sputum or sinus/hb symptoms on present rx. rec Please see patient coordinator before you leave today  to schedule GI referral for colonoscopy    04/09/2012 f/u ov/Kealan Buchan cc cough since 9/30 "like a cold" with mostly dry cough and some chills and nausea but no decrease in po intake then woke up 730 pm 10/1 (normally wakes at 1030pm to go to work) feeling very chilled couldn't move/ speak x 3-5 min then went back to sleep then woke up at 1030 pm and went to work - L shoulder felt like  a "cold in the chest"(hurt some to cough) not sob -at work had temp of 100.1 and sore throat. No sob, no ha or vomiting or gi/gu symptoms.    Sleeping ok without nocturnal  or early am exacerbation  of respiratory  c/o's  Also denies any obvious fluctuation of symptoms with weather or environmental changes or other aggravating or alleviating factors except as outlined above.  ROS  The following are not active complaints unless bolded sore throat, dysphagia, dental problems, itching, sneezing,  nasal congestion or excess/ purulent secretions, ear ache,    fever, chills, sweats, unintended wt loss,  exertional cp, hemoptysis,  orthopnea pnd or leg swelling, presyncope, palpitations, heartburn, abdominal pain, anorexia, nausea, vomiting, diarrhea  or change in bowel or urinary habits, change in stools or urine, dysuria,hematuria,  rash, arthralgias, visual complaints, headache, numbness weakness or ataxia or problems with walking or coordination,  change in mood/affect or memory.         Past Medical History:  ACTINIC KERATOSIS (ICD-702.0)  Referred to [facility] 06/27/2007  PAIN IN JOINT, SITE UNSPECIFIED (ICD-719.40)...............................................[place]  HYPERLIPIDEMIA (ICD-272.4)  - Target LDL < 70 due to DM  OBESITY NOS (ICD-278.00)  - target weight 190, ideal < 171, peak 228 October 21, 2007  - REC: return to nutrition with food diary 07/01/08 > declined referral  DM (ICD-250.00)  A1C 9.0>8.0 01/05/10 Glucovance 2.5mg  /500mg  three times a day , Actos stopped > 9.2 April 11, 2010  HYPERTENSION (ICD-401.9)  HEALTH MAINTENANCE.....................................................................Marland KitchenWert  - Pneumovax 05/2005 - Td 06/27/2007 - Colonoscopy letter sent 09/19/06, reviewed with pt 07/01/08> > referred again October 03, 2009 >did not make appt > re-ordered 01/24/2012  - CPX 01/24/2012         Objective:   Physical Exam  Ambulatory healthy appearing obese white male in no acute distress.  wt 227 07/01/08 > 223 May 30, 2009  > 209 10/18/2010 > 10/10/2011  198 > 01/24/2012 199> 04/09/2012  194  HEENT: nl dentition, and orophanx. mod non-specific turbinate edema. Nl external ear canals without cough reflex  Neck without JVD/Nodes/TM. Carotid upstrokes brsk with no bruits.  Lungs insp and exp rhonchi with exp cough, barking quality, mostly dry  RRR no s3 or murmur or increase in P2. No edema  Abd soft and benign with nl excursion in the supine position. No bruits or organomegaly  Ext warm without calf tenderness, cyanosis  clubbing MS nl gait, no restrictions Skin:   No rash     CXR  04/09/2012 :   Small area of parenchymal opacity, likely at the left lung base, and best seen on the lateral view. This most likely represents pneumonia          Assessment & Plan:

## 2012-04-23 ENCOUNTER — Other Ambulatory Visit: Payer: Self-pay | Admitting: Internal Medicine

## 2012-04-29 ENCOUNTER — Other Ambulatory Visit (INDEPENDENT_AMBULATORY_CARE_PROVIDER_SITE_OTHER): Payer: 59

## 2012-04-29 ENCOUNTER — Ambulatory Visit (INDEPENDENT_AMBULATORY_CARE_PROVIDER_SITE_OTHER)
Admission: RE | Admit: 2012-04-29 | Discharge: 2012-04-29 | Disposition: A | Discharge status: Home / Self Care | Payer: 59 | Source: Ambulatory Visit | Attending: Internal Medicine | Admitting: Internal Medicine

## 2012-04-29 ENCOUNTER — Ambulatory Visit (INDEPENDENT_AMBULATORY_CARE_PROVIDER_SITE_OTHER): Payer: 59 | Admitting: Internal Medicine

## 2012-04-29 ENCOUNTER — Encounter: Payer: Self-pay | Admitting: Internal Medicine

## 2012-04-29 VITALS — BP 142/80 | HR 80 | Temp 96.9°F | Ht 68.0 in | Wt 196.2 lb

## 2012-04-29 DIAGNOSIS — E119 Type 2 diabetes mellitus without complications: Secondary | ICD-10-CM

## 2012-04-29 DIAGNOSIS — J189 Pneumonia, unspecified organism: Secondary | ICD-10-CM

## 2012-04-29 DIAGNOSIS — Z23 Encounter for immunization: Secondary | ICD-10-CM

## 2012-04-29 DIAGNOSIS — I10 Essential (primary) hypertension: Secondary | ICD-10-CM

## 2012-04-29 NOTE — Assessment & Plan Note (Signed)
Lab Results  Component Value Date   HGBA1C 10.4* 04/29/2012   Trending down slowly.    I had an extended discussion with the patient today lasting 15 to 20 minutes of a 25 minute visit on the following issues:   Needs to work harder on meds/ diet/ med reconciliation issues.  See instructions for specific recommendations which were reviewed directly with the patient who was given a copy with highlighter outlining the key components.

## 2012-04-29 NOTE — Patient Instructions (Addendum)
Please remember to go to  x-ray department downstairs for your tests - we will call you with the results when they are available.  See calendar for specific medication instructions and bring it back for each and every office visit for every healthcare provider you see.  Without it,  you may not receive the best quality medical care that we feel you deserve.  You will note that the calendar groups together  your maintenance  medications that are timed at particular times of the day.  Think of this as your checklist for what your doctor has instructed you to do until your next evaluation to see what benefit  there is  to staying on a consistent group of medications intended to keep you well.  The other group at the bottom is entirely up to you to use as you see fit  for specific symptoms that may arise between visits that require you to treat them on an as needed basis.  Think of this as your action plan or "what if" list.   Separating the top medications from the bottom group is fundamental to providing you adequate care going forward.    Please schedule a follow up visit in 3 months but call sooner if needed

## 2012-04-29 NOTE — Assessment & Plan Note (Signed)
Adequate rx, reviewed natural hx of pna and that he likely will have some residual cough x up to 6 weeks

## 2012-04-29 NOTE — Assessment & Plan Note (Signed)
Adequate control on present rx, reviewed  

## 2012-04-29 NOTE — Progress Notes (Signed)
Subjective:    Patient ID: [name] Little, male    DOB: [birth year]    MRN: [id number]  Brief patient profile:  3  yowm quit smoking 2000 with morbid obesity achieving a peak all-time weight of 228 complicated by hypertension and diabetes.    Nov 22, 2009 Followup fasting for labs. Not following med calendar. Pt c/o lower right side abdominal pain x 2 wks "off and on". He states that pain feels achy and sometimes sharp. lasts few secs better after passes gas. migrates around but most severe rlq, no rad to flank or scrotum, no dysuria or hematuria. --Labs showed A1C 9.0, glucovance was increased to three times a day. rec citrucel  > pain resolved  January 05, 2010--Presents for follow up and med review. He has been doing ok. BS averaging 90-180, he has had only 1 low sugar at 75 when he did not eat. We discussed diet and exercise /weight loss. We reveiwed his meds and organized his meds into a med calendar. rec Stop Actos  Continue on Glucovance 2.5mg /500mg  three times a day  Diabetic diet.  Increase activity as tolerated.  Follow med calendar closely and bring to each vist     2 April 11, 2010 Followup. No med calendar. Cc c/p rt side pain- the same, no better or worse. He states that sometimes after eating feels the need to belch but states this is hard to do. not taking citrucel regulary,  , pain migratory and positional. no anorexia nause or change in bowel/bladder habits rec citrucel > resolved then stopped citrucel  10/18/2010 ov with med calendar in hand but not following, doing cbgs at various times instead of fasting as rec. No overt symptoms of hyper or hypoglycemia.  Pt denies any significant sore throat, dysphagia, itching, sneezing,  nasal congestion or excess/ purulent secretions,  fever, chills, sweats, unintended wt loss, pleuritic or exertional cp, hempoptysis, orthopnea pnd or leg swelling.  No abd pain.   rec Use med calendar Stop advicor when you finish it and replace it  simcor Late add:  Needs to move up Cpx with all meds in hand due to hgb a1c drifting up  10/10/2011 f/u ov/[name] Little here for f/u dm/hbp cc recurrent x one month intermittent  R flank pain positional worse when lie on that side then resolves immediately when rolls to other side.  Not taking citrucel any more at all. No assoc nausea or change in painwith bowel or bladder function. Very similar to previous cp that resolved on citrucel. No pleuritic component. Fasting cbg's running 140-180 no overt symptoms of low or high blood sugar. rec Citrucel 1 tsp twice daily with large glass of water and if not better within two please call See calendar for specific medication instructions    01/24/2012 f/u ov/[name] Little for annual eval for multiple chronic problems (dm/hbp/hyperlipidemia)cc abd pain resolved on citrucel w/in a week or two of restarting citrucel.   No unusual cough, purulent sputum or sinus/hb symptoms on present rx. rec Please see patient coordinator before you leave today  to schedule GI referral for colonoscopy    04/09/2012 f/u ov/[name] Little cc cough since 9/30 "like a cold" with mostly dry cough and some chills and nausea but no decrease in po intake then woke up 730 pm 10/1 (normally wakes at 1030pm to go to work) feeling very chilled couldn't move/ speak x 3-5 min then went back to sleep then woke up at 1030 pm and went to work - L shoulder felt like  a "cold in the chest"(hurt some to cough) not sob -at work had temp of 100.1 and sore throat. No sob, no ha or vomiting or gi/gu symptoms. cxr c/w ? pna rec Doxycline 100 mg twice daily x 10 days Mucinex dm for cough as per your calendar   04/29/2012 f/u ov/[name] Little cc almost completely better, back at work min cough, no sore throat or L shoulder pain.   Sleeping ok without nocturnal  or early am exacerbation  of respiratory  c/o's  Also denies any obvious fluctuation of symptoms with weather or environmental changes or other aggravating or alleviating  factors except as outlined above.  ROS  The following are not active complaints unless bolded sore throat, dysphagia, dental problems, itching, sneezing,  nasal congestion or excess/ purulent secretions, ear ache,   fever, chills, sweats, unintended wt loss,  exertional cp, hemoptysis,  orthopnea pnd or leg swelling, presyncope, palpitations, heartburn, abdominal pain, anorexia, nausea, vomiting, diarrhea  or change in bowel or urinary habits, change in stools or urine, dysuria,hematuria,  rash, arthralgias, visual complaints, headache, numbness weakness or ataxia or problems with walking or coordination,  change in mood/affect or memory.         Past Medical History:  ACTINIC KERATOSIS (ICD-702.0)  Referred to [facility] 06/27/2007  PAIN IN JOINT, SITE UNSPECIFIED (ICD-719.40)...............................................[place]  HYPERLIPIDEMIA (ICD-272.4)  - Target LDL < 70 due to DM  OBESITY NOS (ICD-278.00)  - target weight 190, ideal < 171, peak 228 October 21, 2007  - REC: return to nutrition with food diary 07/01/08 > declined referral  DM (ICD-250.00)  A1C 9.0>8.0 01/05/10 Glucovance 2.5mg  /500mg  three times a day , Actos stopped > 9.2 April 11, 2010  HYPERTENSION (ICD-401.9)  HEALTH MAINTENANCE.....................................................................Marland KitchenWert  - Pneumovax 04/29/2012  - Td 06/27/2007 - Colonoscopy letter sent 09/19/06, reviewed with pt 07/01/08> > referred again October 03, 2009 >did not make appt > re-ordered 01/24/2012  - CPX 01/24/2012         Objective:   Physical Exam  Ambulatory healthy appearing obese white male in no acute distress.  wt 227 07/01/08 > 223 May 30, 2009  > 209 10/18/2010 > 10/10/2011  198 > 01/24/2012 199> 04/09/2012  194 > 196 04/29/2012  HEENT: nl dentition, and orophanx. mod non-specific turbinate edema. Nl external ear canals without cough reflex  Neck without JVD/Nodes/TM. Carotid upstrokes brsk with no bruits.  Lungs insp and exp  rhonchi with exp cough, barking quality, mostly dry  RRR no s3 or murmur or increase in P2. No edema  Abd soft and benign with nl excursion in the supine position. No bruits or organomegaly  Ext warm without calf tenderness, cyanosis clubbing MS nl gait, no restrictions Skin:   No rash     CXR  04/29/2012 :   Resolved left lower lobe infiltrate. No acute cardiopulmonary process.          Assessment & Plan:

## 2012-06-10 ENCOUNTER — Encounter: Payer: Self-pay | Admitting: Adult Health

## 2012-06-10 ENCOUNTER — Ambulatory Visit (INDEPENDENT_AMBULATORY_CARE_PROVIDER_SITE_OTHER): Payer: 59 | Admitting: Adult Health

## 2012-06-10 VITALS — BP 114/66 | HR 68 | Temp 97.1°F | Ht 68.0 in | Wt 192.4 lb

## 2012-06-10 DIAGNOSIS — E119 Type 2 diabetes mellitus without complications: Secondary | ICD-10-CM

## 2012-06-10 MED ORDER — MOMETASONE FUROATE 50 MCG/ACT NA SUSP: 2.0000 | Freq: Every day | NASAL | Status: DC

## 2012-06-10 MED ORDER — SITAGLIPTIN PHOSPHATE 50 MG PO TABS: 50.0000 mg | ORAL_TABLET | Freq: Every day | ORAL | Status: DC

## 2012-06-10 NOTE — Progress Notes (Signed)
Subjective:    Patient ID: [name] Little, male    DOB: [birth year]    MRN: [id number]  Brief patient profile:  3  yowm quit smoking 2000 with morbid obesity achieving a peak all-time weight of 228 complicated by hypertension and diabetes.    Nov 22, 2009 Followup fasting for labs. Not following med calendar. Pt c/o lower right side abdominal pain x 2 wks "off and on". He states that pain feels achy and sometimes sharp. lasts few secs better after passes gas. migrates around but most severe rlq, no rad to flank or scrotum, no dysuria or hematuria. --Labs showed A1C 9.0, glucovance was increased to three times a day. rec citrucel  > pain resolved  January 05, 2010--Presents for follow up and med review. He has been doing ok. BS averaging 90-180, he has had only 1 low sugar at 75 when he did not eat. We discussed diet and exercise /weight loss. We reveiwed his meds and organized his meds into a med calendar. rec Stop Actos  Continue on Glucovance 2.5mg /500mg  three times a day  Diabetic diet.  Increase activity as tolerated.  Follow med calendar closely and bring to each vist     2 April 11, 2010 Followup. No med calendar. Cc c/p rt side pain- the same, no better or worse. He states that sometimes after eating feels the need to belch but states this is hard to do. not taking citrucel regulary,  , pain migratory and positional. no anorexia nause or change in bowel/bladder habits rec citrucel > resolved then stopped citrucel  10/18/2010 ov with med calendar in hand but not following, doing cbgs at various times instead of fasting as rec. No overt symptoms of hyper or hypoglycemia.  Pt denies any significant sore throat, dysphagia, itching, sneezing,  nasal congestion or excess/ purulent secretions,  fever, chills, sweats, unintended wt loss, pleuritic or exertional cp, hempoptysis, orthopnea pnd or leg swelling.  No abd pain.   rec Use med calendar Stop advicor when you finish it and replace it  simcor Late add:  Needs to move up Cpx with all meds in hand due to hgb a1c drifting up  10/10/2011 f/u ov/Wert here for f/u dm/hbp cc recurrent x one month intermittent  R flank pain positional worse when lie on that side then resolves immediately when rolls to other side.  Not taking citrucel any more at all. No assoc nausea or change in painwith bowel or bladder function. Very similar to previous cp that resolved on citrucel. No pleuritic component. Fasting cbg's running 140-180 no overt symptoms of low or high blood sugar. rec Citrucel 1 tsp twice daily with large glass of water and if not better within two please call See calendar for specific medication instructions    01/24/2012 f/u ov/Wert for annual eval for multiple chronic problems (dm/hbp/hyperlipidemia)cc abd pain resolved on citrucel w/in a week or two of restarting citrucel.   No unusual cough, purulent sputum or sinus/hb symptoms on present rx. rec Please see patient coordinator before you leave today  to schedule GI referral for colonoscopy    04/09/2012 f/u ov/Wert cc cough since 9/30 "like a cold" with mostly dry cough and some chills and nausea but no decrease in po intake then woke up 730 pm 10/1 (normally wakes at 1030pm to go to work) feeling very chilled couldn't move/ speak x 3-5 min then went back to sleep then woke up at 1030 pm and went to work - L shoulder felt like  a "cold in the chest"(hurt some to cough) not sob -at work had temp of 100.1 and sore throat. No sob, no ha or vomiting or gi/gu symptoms. cxr c/w ? pna rec Doxycline 100 mg twice daily x 10 days Mucinex dm for cough as per your calendar   04/29/2012 f/u ov/Wert cc almost completely better, back at work min cough, no sore throat or L shoulder pain. >>no changes   06/10/2012 Follow up    est med calendar - pt brought all meds with him today.  reports breathing is doing well. We reviewed all meds and organized them into a med calendar , appears to be  taking meds correctly Labs last ov showed A1C 10.4 despite glucovance increase .  We discussed diet control. BS avergae 180-200 at home.  No chest pain , polyuria or polydipsia.      Past Medical History:  ACTINIC KERATOSIS (ICD-702.0)  Referred to [facility] 06/27/2007  PAIN IN JOINT, SITE UNSPECIFIED (ICD-719.40)...............................................[place]  HYPERLIPIDEMIA (ICD-272.4)  - Target LDL < 70 due to DM  OBESITY NOS (ICD-278.00)  - target weight 190, ideal < 171, peak 228 October 21, 2007  - REC: return to nutrition with food diary 07/01/08 > declined referral  DM (ICD-250.00)  A1C 9.0>8.0 01/05/10 Glucovance 2.5mg  /500mg  three times a day , Actos stopped > 9.2 April 11, 2010  HYPERTENSION (ICD-401.9)  HEALTH MAINTENANCE.....................................................................Marland KitchenWert  - Pneumovax 04/29/2012  - Td 06/27/2007 - Colonoscopy letter sent 09/19/06, reviewed with pt 07/01/08> > referred again October 03, 2009 >did not make appt > re-ordered 01/24/2012  - CPX 01/24/2012     ROS Constitutional:   No  weight loss, night sweats,  Fevers, chills,  fatigue, or  lassitude.  HEENT:   No headaches,  Difficulty swallowing,  Tooth/dental problems, or  Sore throat,                No sneezing, itching, ear ache, nasal congestion, post nasal drip,   CV:  No chest pain,  Orthopnea, PND,   anasarca, dizziness, palpitations, syncope.   GI  No heartburn, indigestion, abdominal pain, nausea, vomiting, diarrhea, change in bowel habits, loss of appetite, bloody stools.   Resp: No shortness of breath with exertion or at rest.  No excess mucus, no productive cough,  No non-productive cough,  No coughing up of blood.  No change in color of mucus.  No wheezing.  No chest wall deformity  Skin: no rash or lesions.  GU: no dysuria, change in color of urine, no urgency or frequency.  No flank pain, no hematuria   MS:  No joint pain or swelling.  No decreased range of motion.   No back pain.  Psych:  No change in mood or affect. No depression or anxiety.  No memory loss.         Objective:   Physical Exam  Ambulatory healthy appearing obese white male in no acute distress.  wt 227 07/01/08 > 223 May 30, 2009  > 209 10/18/2010 > 10/10/2011  198 > 01/24/2012 199> 04/09/2012  194 > 196 04/29/2012 >192 06/10/12  HEENT: nl dentition, and orophanx. mod non-specific turbinate edema. Nl external ear canals without cough reflex  Neck without JVD/Nodes/TM. Carotid upstrokes brsk with no bruits.  Lungs diminished BS in bases  RRR no s3 or murmur or increase in P2. No edema  Abd soft and benign with nl excursion in the supine position. No bruits or organomegaly  Ext warm without calf tenderness, cyanosis clubbing MS  nl gait, no restrictions Skin:   No rash     CXR  04/29/2012 :   Resolved left lower lobe infiltrate. No acute cardiopulmonary process.          Assessment & Plan:

## 2012-06-10 NOTE — Patient Instructions (Addendum)
Add Januvia 50mg  daily in am when you wake up.  Call if blood sugars >80 on consistent basis .  Low sweet diet.  Follow up Dr. Sherene Sires  In 6 weeks  Follow med calendar closely and bring to each visit.

## 2012-06-13 NOTE — Assessment & Plan Note (Signed)
Not optimally controlled  Patient's medications were reviewed today and patient education was given. Computerized medication calendar was adjusted/completed  Plan  Add Januvia 50mg  daily in am when you wake up.  Call if blood sugars >80 on consistent basis .  Low sweet diet.  Follow up Dr. Sherene Sires  In 6 weeks  Follow med calendar closely and bring to each visit.

## 2012-06-16 ENCOUNTER — Other Ambulatory Visit: Payer: Self-pay | Admitting: Internal Medicine

## 2012-07-22 ENCOUNTER — Encounter: Payer: Self-pay | Admitting: Internal Medicine

## 2012-07-22 ENCOUNTER — Other Ambulatory Visit (INDEPENDENT_AMBULATORY_CARE_PROVIDER_SITE_OTHER): Payer: 59

## 2012-07-22 ENCOUNTER — Ambulatory Visit (INDEPENDENT_AMBULATORY_CARE_PROVIDER_SITE_OTHER): Payer: 59 | Admitting: Internal Medicine

## 2012-07-22 VITALS — BP 130/72 | HR 72 | Temp 97.2°F | Ht 68.0 in | Wt 197.0 lb

## 2012-07-22 DIAGNOSIS — I10 Essential (primary) hypertension: Secondary | ICD-10-CM

## 2012-07-22 DIAGNOSIS — E119 Type 2 diabetes mellitus without complications: Secondary | ICD-10-CM

## 2012-07-22 MED ORDER — SITAGLIPTIN PHOSPHATE 25 MG PO TABS: 25.0000 mg | ORAL_TABLET | Freq: Every day | ORAL | Status: DC

## 2012-07-22 NOTE — Patient Instructions (Addendum)
Reduce Januvia  50 mg to one half daily and edit your calendar  When you get your new prescription for Januvia = 25 mg take one daily  Please remember to go to the lab   department downstairs for your tests - we will call you with the results when they are available.     Please schedule a follow up visit in 3 months but call sooner if needed

## 2012-07-22 NOTE — Progress Notes (Signed)
Subjective:    Patient ID: [name] Little, male    DOB: May 08, 1951    MRN: [id number]  Brief patient profile:  17  yowm quit smoking 2000 with morbid obesity achieving a peak all-time weight of 228 complicated by hypertension and diabetes.    Nov 22, 2009 Followup fasting for labs. Not following med calendar. Pt c/o lower right side abdominal pain x 2 wks "off and on". He states that pain feels achy and sometimes sharp. lasts few secs better after passes gas. migrates around but most severe rlq, no rad to flank or scrotum, no dysuria or hematuria. --Labs showed A1C 9.0, glucovance was increased to three times a day. rec citrucel  > pain resolved  January 05, 2010--Presents for follow up and med review. He has been doing ok. BS averaging 90-180, he has had only 1 low sugar at 75 when he did not eat. We discussed diet and exercise /weight loss. We reveiwed his meds and organized his meds into a med calendar. rec Stop Actos  Continue on Glucovance 2.5mg /500mg  three times a day  Diabetic diet.  Increase activity as tolerated.  Follow med calendar closely and bring to each vist     2 April 11, 2010 Followup. No med calendar. Cc c/p rt side pain- the same, no better or worse. He states that sometimes after eating feels the need to belch but states this is hard to do. not taking citrucel regulary,  , pain migratory and positional. no anorexia nause or change in bowel/bladder habits rec citrucel > resolved then stopped citrucel  10/18/2010 ov with med calendar in hand but not following, doing cbgs at various times instead of fasting as rec. No overt symptoms of hyper or hypoglycemia.  Pt denies any significant sore throat, dysphagia, itching, sneezing,  nasal congestion or excess/ purulent secretions,  fever, chills, sweats, unintended wt loss, pleuritic or exertional cp, hempoptysis, orthopnea pnd or leg swelling.  No abd pain.   rec Use med calendar Stop advicor when you finish it and replace it  simcor Late add:  Needs to move up Cpx with all meds in hand due to hgb a1c drifting up  10/10/2011 f/u ov/Libi Corso here for f/u dm/hbp cc recurrent x one month intermittent  R flank pain positional worse when lie on that side then resolves immediately when rolls to other side.  Not taking citrucel any more at all. No assoc nausea or change in painwith bowel or bladder function. Very similar to previous cp that resolved on citrucel. No pleuritic component. Fasting cbg's running 140-180 no overt symptoms of low or high blood sugar. rec Citrucel 1 tsp twice daily with large glass of water and if not better within two please call See calendar for specific medication instructions    01/24/2012 f/u ov/Shawna Kiener for annual eval for multiple chronic problems (dm/hbp/hyperlipidemia)cc abd pain resolved on citrucel w/in a week or two of restarting citrucel.   No unusual cough, purulent sputum or sinus/hb symptoms on present rx. rec Please see patient coordinator before you leave today  to schedule GI referral for colonoscopy    04/09/2012 f/u ov/Allisha Harter cc cough since 9/30 "like a cold" with mostly dry cough and some chills and nausea but no decrease in po intake then woke up 730 pm 10/1 (normally wakes at 1030pm to go to work) feeling very chilled couldn't move/ speak x 3-5 min then went back to sleep then woke up at 1030 pm and went to work - L shoulder felt like  a "cold in the chest"(hurt some to cough) not sob -at work had temp of 100.1 and sore throat. No sob, no ha or vomiting or gi/gu symptoms. cxr c/w ? pna rec Doxycline 100 mg twice daily x 10 days Mucinex dm for cough as per your calendar   04/29/2012 f/u ov/Wing Gfeller cc almost completely better, back at work min cough, no sore throat or L shoulder pain. >>no changes   06/10/2012 Follow up    est med calendar - pt brought all meds with him today.  reports breathing is doing well. We reviewed all meds and organized them into a med calendar , appears to be  taking meds correctly Labs last ov showed A1C 10.4 despite glucovance increase .  We discussed diet control. BS average 180-200 at home.  rec Add Januvia 50mg  daily in am when you wake up.  Call if blood sugars >80 on consistent basis .  Low sweet diet.  Follow up Dr. Sherene Sires  In 6 weeks   07/22/2012 f/u ov/Darneshia Demary cc sugars too low after fasting since on 50 mg januvia, did not bring calendar. No tia or claudication, no cp or sob.  Sleeping ok without nocturnal  or early am exacerbation  of respiratory  c/o's. Also denies any obvious fluctuation of symptoms with weather or environmental changes or other aggravating or alleviating factors except as outlined above   ROS  The following are not active complaints unless bolded sore throat, dysphagia, dental problems, itching, sneezing,  nasal congestion or excess/ purulent secretions, ear ache,   fever, chills, sweats, unintended wt loss, pleuritic or exertional cp, hemoptysis,  orthopnea pnd or leg swelling, presyncope, palpitations, heartburn, abdominal pain, anorexia, nausea, vomiting, diarrhea  or change in bowel or urinary habits, change in stools or urine, dysuria,hematuria,  rash, arthralgias, visual complaints, headache, numbness weakness or ataxia or problems with walking or coordination,  change in mood/affect or memory.               Past Medical History:  ACTINIC KERATOSIS (ICD-702.0)  Referred to [facility] 06/27/2007  PAIN IN JOINT, SITE UNSPECIFIED (ICD-719.40)...............................................[place]  HYPERLIPIDEMIA (ICD-272.4)  - Target LDL < 70 due to DM  OBESITY NOS (ICD-278.00)  - target weight 190, ideal < 171, peak 228 October 21, 2007  - REC: return to nutrition with food diary 07/01/08 > declined referral  DM (ICD-250.00)  A1C 9.0>8.0 01/05/10 Glucovance 2.5mg  /500mg  three times a day , Actos stopped > 9.2 April 11, 2010  HYPERTENSION (ICD-401.9)  HEALTH  MAINTENANCE.....................................................................Marland KitchenWert  - Pneumovax 04/29/2012  - Td 06/27/2007 - Colonoscopy letter sent 09/19/06, reviewed with pt 07/01/08> > referred again October 03, 2009 >did not make appt > re-ordered 01/24/2012 > reminded 07/22/2012  - CPX 01/24/2012            Objective:   Physical Exam  Ambulatory healthy appearing obese white male in no acute distress.  wt 227 07/01/08 > 223 May 30, 2009  > 209 10/18/2010 > 10/10/2011  198 > 01/24/2012 199> 04/09/2012  194 > 196 04/29/2012 >192 06/10/12  > 197 07/22/2012  HEENT: nl dentition, and orophanx. mod non-specific turbinate edema. Nl external ear canals without cough reflex  Neck without JVD/Nodes/TM. Carotid upstrokes brsk with no bruits.  Lungs diminished BS in bases  RRR no s3 or murmur or increase in P2. No edema  Abd soft and benign with nl excursion in the supine position. No bruits or organomegaly  Ext warm without calf tenderness, cyanosis clubbing MS nl gait,  no restrictions Skin:   No rash     CXR  04/29/2012 :   Resolved left lower lobe infiltrate. No acute cardiopulmonary process.          Assessment & Plan:

## 2012-07-22 NOTE — Progress Notes (Signed)
Quick Note:  Spoke with pt and notified of results per Dr. Wert. Pt verbalized understanding and denied any questions.  ______ 

## 2012-07-24 NOTE — Assessment & Plan Note (Signed)
Lab Results  Component Value Date   HGBA1C 8.9* 07/22/2012   Sugars trending down but fasting hypoglycemia is problematic - will reduce januvia to 25 mg daily    Each maintenance medication was reviewed in detail including most importantly the difference between maintenance and as needed and under what circumstances the prns are to be used.  Please see instructions for details which were reviewed in writing and the patient given a copy.

## 2012-07-24 NOTE — Assessment & Plan Note (Signed)
Adequate control on present rx, reviewed  

## 2012-10-21 ENCOUNTER — Encounter: Payer: Self-pay | Admitting: Internal Medicine

## 2012-10-21 ENCOUNTER — Other Ambulatory Visit (INDEPENDENT_AMBULATORY_CARE_PROVIDER_SITE_OTHER): Payer: 59

## 2012-10-21 ENCOUNTER — Ambulatory Visit (INDEPENDENT_AMBULATORY_CARE_PROVIDER_SITE_OTHER): Payer: 59 | Admitting: Internal Medicine

## 2012-10-21 VITALS — BP 118/70 | HR 66 | Temp 97.0°F | Ht 67.0 in | Wt 197.2 lb

## 2012-10-21 DIAGNOSIS — L570 Actinic keratosis: Secondary | ICD-10-CM

## 2012-10-21 DIAGNOSIS — E119 Type 2 diabetes mellitus without complications: Secondary | ICD-10-CM

## 2012-10-21 DIAGNOSIS — E785 Hyperlipidemia, unspecified: Secondary | ICD-10-CM

## 2012-10-21 DIAGNOSIS — I10 Essential (primary) hypertension: Secondary | ICD-10-CM

## 2012-10-21 LAB — HEPATIC FUNCTION PANEL
ALT: 35 U/L (ref 0–53)
AST: 28 U/L (ref 0–37)
Albumin: 4.2 g/dL (ref 3.5–5.2)
Alkaline Phosphatase: 52 U/L (ref 39–117)
Total Protein: 7.4 g/dL (ref 6.0–8.3)

## 2012-10-21 LAB — BASIC METABOLIC PANEL
CO2: 24 mEq/L (ref 19–32)
Calcium: 8.9 mg/dL (ref 8.4–10.5)
Glucose, Bld: 203 mg/dL — ABNORMAL HIGH (ref 70–99)
Potassium: 3.9 mEq/L (ref 3.5–5.1)
Sodium: 134 mEq/L — ABNORMAL LOW (ref 135–145)

## 2012-10-21 NOTE — Patient Instructions (Addendum)
Please see patient coordinator before you leave today  to schedule referral to derm  See Tammy NP w/in 3 months with all your medications, even over the counter meds, separated in two separate bags, the ones you take no matter what vs the ones you stop once you feel better and take only as needed when you feel you need them.   Tammy  will generate for you a new user friendly medication calendar that will put [place] all on the same page re: your medication use.     Without this process, it simply isn't possible to assure that we are providing  your outpatient care  with  the attention to detail we feel you deserve.   If we cannot assure that you're getting that kind of care,  then we cannot manage your problem effectively from this clinic.  Once you have seen Tammy and we are sure that we're all on the same page with your medication use she will arrange follow up with me for cpx

## 2012-10-21 NOTE — Progress Notes (Signed)
Subjective:    Patient ID: [name] Little, male    DOB: March 03, 1951    MRN: [id number]  Brief patient profile:  81  yowm quit smoking 2000 with morbid obesity achieving a peak all-time weight of 228 complicated by hypertension and diabetes.    Nov 22, 2009 Followup fasting for labs. Not following med calendar. Pt c/o lower right side abdominal pain x 2 wks "off and on". He states that pain feels achy and sometimes sharp. lasts few secs better after passes gas. migrates around but most severe rlq, no rad to flank or scrotum, no dysuria or hematuria. --Labs showed A1C 9.0, glucovance was increased to three times a day. rec citrucel  > pain resolved  January 05, 2010--Presents for follow up and med review. He has been doing ok. BS averaging 90-180, he has had only 1 low sugar at 75 when he did not eat. We discussed diet and exercise /weight loss. We reveiwed his meds and organized his meds into a med calendar. rec Stop Actos  Continue on Glucovance 2.5mg /500mg  three times a day  Diabetic diet.  Increase activity as tolerated.  Follow med calendar closely and bring to each vist     2 April 11, 2010 Followup. No med calendar. Cc c/p rt side pain- the same, no better or worse. He states that sometimes after eating feels the need to belch but states this is hard to do. not taking citrucel regulary,  , pain migratory and positional. no anorexia nause or change in bowel/bladder habits rec citrucel > resolved then stopped citrucel  10/18/2010 ov with med calendar in hand but not following, doing cbgs at various times instead of fasting as rec. No overt symptoms of hyper or hypoglycemia.  Pt denies any significant sore throat, dysphagia, itching, sneezing,  nasal congestion or excess/ purulent secretions,  fever, chills, sweats, unintended wt loss, pleuritic or exertional cp, hempoptysis, orthopnea pnd or leg swelling.  No abd pain.   rec Use med calendar Stop advicor when you finish it and replace it  simcor Late add:  Needs to move up Cpx with all meds in hand due to hgb a1c drifting up  10/10/2011 f/u ov/Natalia Wittmeyer here for f/u dm/hbp cc recurrent x one month intermittent  R flank pain positional worse when lie on that side then resolves immediately when rolls to other side.  Not taking citrucel any more at all. No assoc nausea or change in painwith bowel or bladder function. Very similar to previous cp that resolved on citrucel. No pleuritic component. Fasting cbg's running 140-180 no overt symptoms of low or high blood sugar. rec Citrucel 1 tsp twice daily with large glass of water and if not better within two please call See calendar for specific medication instructions    01/24/2012 f/u ov/Amilyah Nack for annual eval for multiple chronic problems (dm/hbp/hyperlipidemia)cc abd pain resolved on citrucel w/in a week or two of restarting citrucel.   No unusual cough, purulent sputum or sinus/hb symptoms on present rx. rec Please see patient coordinator before you leave today  to schedule GI referral for colonoscopy    04/09/2012 f/u ov/Lior Cartelli cc cough since 9/30 "like a cold" with mostly dry cough and some chills and nausea but no decrease in po intake then woke up 730 pm 10/1 (normally wakes at 1030pm to go to work) feeling very chilled couldn't move/ speak x 3-5 min then went back to sleep then woke up at 1030 pm and went to work - L shoulder felt like  a "cold in the chest"(hurt some to cough) not sob -at work had temp of 100.1 and sore throat. No sob, no ha or vomiting or gi/gu symptoms. cxr c/w ? pna rec Doxycline 100 mg twice daily x 10 days Mucinex dm for cough as per your calendar   04/29/2012 f/u ov/Leiana Rund cc almost completely better, back at work min cough, no sore throat or L shoulder pain. >>no changes   06/10/2012 Follow up    est med calendar - pt brought all meds with him today.  reports breathing is doing well. We reviewed all meds and organized them into a med calendar , appears to be  taking meds correctly Labs last ov showed A1C 10.4 despite glucovance increase .  We discussed diet control. BS average 180-200 at home.  rec Add Januvia 50mg  daily in am when you wake up.  Call if blood sugars >80 on consistent basis .  Low sweet diet.  Follow up Dr. Sherene Sires  In 6 weeks   07/22/2012 f/u ov/Jamus Loving cc sugars too low after fasting since on 50 mg januvia, did not bring calendar. No tia or claudication, no cp or sob. rec Reduce Januvia  50 mg to one half daily and edit your calendar When you get your new prescription for Januvia = 25 mg take one daily   10/21/2012 f/u ov/Kiauna Zywicki re hbp, dm, new skin changes Chief Complaint  Patient presents with  . Follow-up    pt wants referrall for knot on his left ear, and "cancer spots" on his arms.  cbg's 120-180 fasting.  No cp, sob, claudication/ tia   Sleeping ok without nocturnal  or early am exacerbation  of respiratory  c/o's. Also denies any obvious fluctuation of symptoms with weather or environmental changes or other aggravating or alleviating factors except as outlined above   ROS  The following are not active complaints unless bolded sore throat, dysphagia, dental problems, itching, sneezing,  nasal congestion or excess/ purulent secretions, ear ache,   fever, chills, sweats, unintended wt loss, pleuritic or exertional cp, hemoptysis,  orthopnea pnd or leg swelling, presyncope, palpitations, heartburn, abdominal pain, anorexia, nausea, vomiting, diarrhea  or change in bowel or urinary habits, change in stools or urine, dysuria,hematuria,  rash, arthralgias, visual complaints, headache, numbness weakness or ataxia or problems with walking or coordination,  change in mood/affect or memory.               Past Medical History:  ACTINIC KERATOSIS (ICD-702.0)  Referred to [facility] 06/27/2007  PAIN IN JOINT, SITE UNSPECIFIED (ICD-719.40)...............................................[place]  HYPERLIPIDEMIA (ICD-272.4)  - Target LDL <  70 due to DM  OBESITY NOS (ICD-278.00)  - target weight 190, ideal < 171, peak 228 October 21, 2007  - REC: return to nutrition with food diary 07/01/08 > declined referral  DM (ICD-250.00)  A1C 9.0>8.0 01/05/10 Glucovance 2.5mg  /500mg  three times a day , Actos stopped > 9.2 April 11, 2010  HYPERTENSION (ICD-401.9)  HEALTH MAINTENANCE.....................................................................Marland KitchenWert  - Pneumovax 04/29/2012  - Td 06/27/2007 - Colonoscopy letter sent 09/19/06, reviewed with pt 07/01/08> > referred again October 03, 2009 >did not make appt > re-ordered 01/24/2012 > reminded 07/22/2012  - CPX 01/24/2012            Objective:   Physical Exam  Ambulatory healthy appearing obese white male in no acute distress.  wt 227 07/01/08 > 223 May 30, 2009  > 209 10/18/2010 > 10/10/2011  198 > 01/24/2012 199> 04/09/2012  194 > 196 04/29/2012 >192  06/10/12  > 197 07/22/2012 > 10/21/2012  197  HEENT: nl dentition, and orophanx. mod non-specific turbinate edema. Nl external ear canals without cough reflex  Neck without JVD/Nodes/TM. Carotid upstrokes brsk with no bruits.  Lungs diminished BS in bases  RRR no s3 or murmur or increase in P2. No edema  Abd soft and benign with nl excursion in the supine position. No bruits or organomegaly  Ext warm without calf tenderness, cyanosis clubbing MS nl gait, no restrictions Skin:   No rash, scattered ak's face and ears     CXR  04/29/2012 :   Resolved left lower lobe infiltrate. No acute cardiopulmonary process.          Assessment & Plan:

## 2012-10-22 NOTE — Assessment & Plan Note (Signed)
Referred back to Dr Terri Piedra

## 2012-10-22 NOTE — Assessment & Plan Note (Signed)
-   Target LDL < 70 due to aodm  Lab Results  Component Value Date   CHOL 128 10/21/2012   HDL 39.00* 10/21/2012   LDLCALC 79 10/21/2012   TRIG 48.0 10/21/2012   CHOLHDL 3 10/21/2012   Adequate control on present rx, reviewed

## 2012-10-22 NOTE — Assessment & Plan Note (Signed)
Not Adequate control on present rx, reviewed limited options.   Will need tigher control but previous attempts to adjust meds up resulted in significant hypoglycemic events  Will work harder on diet for now

## 2012-10-22 NOTE — Assessment & Plan Note (Signed)
Adequate control on present rx, reviewed  

## 2012-10-23 ENCOUNTER — Telehealth: Payer: Self-pay | Admitting: Internal Medicine

## 2012-10-23 NOTE — Telephone Encounter (Signed)
ATC patient no answer. LMOMTCB  Result Note    Call patient : Studies show suboptimal glucose control > diet key no change in meds for now

## 2012-10-23 NOTE — Progress Notes (Signed)
Quick Note:  LMOMTCB ______

## 2012-10-27 NOTE — Telephone Encounter (Signed)
I spoke with patient about results and he verbalized understanding and had no questions 

## 2012-10-27 NOTE — Telephone Encounter (Signed)
CORRECTION: CALL PT TODAY @ [phone] (NOT [phone]). [name] Little

## 2012-10-27 NOTE — Telephone Encounter (Addendum)
Pt returned call re: results. Call him @ work today ph# [phone]. Hazel Sams

## 2012-11-01 ENCOUNTER — Other Ambulatory Visit: Payer: Self-pay | Admitting: Internal Medicine

## 2012-11-13 ENCOUNTER — Other Ambulatory Visit: Payer: Self-pay | Admitting: Surgery

## 2012-12-24 ENCOUNTER — Other Ambulatory Visit: Payer: Self-pay | Admitting: Internal Medicine

## 2013-01-22 ENCOUNTER — Encounter: Payer: 59 | Admitting: Adult Health

## 2013-01-26 ENCOUNTER — Encounter: Payer: 59 | Admitting: Adult Health

## 2013-01-27 ENCOUNTER — Encounter: Payer: Self-pay | Admitting: Adult Health

## 2013-01-27 ENCOUNTER — Encounter: Payer: Self-pay | Admitting: Internal Medicine

## 2013-01-27 ENCOUNTER — Other Ambulatory Visit (INDEPENDENT_AMBULATORY_CARE_PROVIDER_SITE_OTHER): Payer: 59

## 2013-01-27 ENCOUNTER — Ambulatory Visit (INDEPENDENT_AMBULATORY_CARE_PROVIDER_SITE_OTHER): Payer: 59 | Admitting: Adult Health

## 2013-01-27 VITALS — BP 116/70 | HR 84 | Temp 97.6°F | Ht 68.0 in | Wt 199.6 lb

## 2013-01-27 DIAGNOSIS — M25569 Pain in unspecified knee: Secondary | ICD-10-CM

## 2013-01-27 DIAGNOSIS — I10 Essential (primary) hypertension: Secondary | ICD-10-CM

## 2013-01-27 DIAGNOSIS — E119 Type 2 diabetes mellitus without complications: Secondary | ICD-10-CM

## 2013-01-27 DIAGNOSIS — M25562 Pain in left knee: Secondary | ICD-10-CM

## 2013-01-27 DIAGNOSIS — Z0000 Encounter for general adult medical examination without abnormal findings: Secondary | ICD-10-CM

## 2013-01-27 DIAGNOSIS — E785 Hyperlipidemia, unspecified: Secondary | ICD-10-CM

## 2013-01-27 LAB — BASIC METABOLIC PANEL
CO2: 26 mEq/L (ref 19–32)
Chloride: 101 mEq/L (ref 96–112)
Glucose, Bld: 247 mg/dL — ABNORMAL HIGH (ref 70–99)
Potassium: 4.1 mEq/L (ref 3.5–5.1)
Sodium: 136 mEq/L (ref 135–145)

## 2013-01-27 MED ORDER — ONETOUCH ULTRA SYSTEM W/DEVICE KIT: PACK | Status: AC

## 2013-01-27 NOTE — Patient Instructions (Addendum)
We are setting you up for a screening colonoscopy Low fat cholesterol diet Low sweets.  Check sugars 3 times weekly,bring log to next office visit.  May use Ice to knee Twice daily  As needed   Please contact office for sooner follow up if symptoms do not improve or worsen or seek emergency care  follow up Dr. Sherene Sires  In 3 months for physical .

## 2013-01-27 NOTE — Progress Notes (Signed)
Subjective:    Patient ID: [name] Little, male    DOB: [birth year]    MRN: [id number]  Brief patient profile:  12  yowm quit smoking 2000 with morbid obesity achieving a peak all-time weight of 228 complicated by hypertension and diabetes.    Nov 22, 2009 Followup fasting for labs. Not following med calendar. Pt c/o lower right side abdominal pain x 2 wks "off and on". He states that pain feels achy and sometimes sharp. lasts few secs better after passes gas. migrates around but most severe rlq, no rad to flank or scrotum, no dysuria or hematuria. --Labs showed A1C 9.0, glucovance was increased to three times a day. rec citrucel  > pain resolved  January 05, 2010--Presents for follow up and med review. He has been doing ok. BS averaging 90-180, he has had only 1 low sugar at 75 when he did not eat. We discussed diet and exercise /weight loss. We reveiwed his meds and organized his meds into a med calendar. rec Stop Actos  Continue on Glucovance 2.5mg /500mg  three times a day  Diabetic diet.  Increase activity as tolerated.  Follow med calendar closely and bring to each vist     2 April 11, 2010 Followup. No med calendar. Cc c/p rt side pain- the same, no better or worse. He states that sometimes after eating feels the need to belch but states this is hard to do. not taking citrucel regulary,  , pain migratory and positional. no anorexia nause or change in bowel/bladder habits rec citrucel > resolved then stopped citrucel  10/18/2010 ov with med calendar in hand but not following, doing cbgs at various times instead of fasting as rec. No overt symptoms of hyper or hypoglycemia.  Pt denies any significant sore throat, dysphagia, itching, sneezing,  nasal congestion or excess/ purulent secretions,  fever, chills, sweats, unintended wt loss, pleuritic or exertional cp, hempoptysis, orthopnea pnd or leg swelling.  No abd pain.   rec Use med calendar Stop advicor when you finish it and replace it  simcor Late add:  Needs to move up Cpx with all meds in hand due to hgb a1c drifting up  10/10/2011 f/u ov/Wert here for f/u dm/hbp cc recurrent x one month intermittent  R flank pain positional worse when lie on that side then resolves immediately when rolls to other side.  Not taking citrucel any more at all. No assoc nausea or change in painwith bowel or bladder function. Very similar to previous cp that resolved on citrucel. No pleuritic component. Fasting cbg's running 140-180 no overt symptoms of low or high blood sugar. rec Citrucel 1 tsp twice daily with large glass of water and if not better within two please call See calendar for specific medication instructions    01/24/2012 f/u ov/Wert for annual eval for multiple chronic problems (dm/hbp/hyperlipidemia)cc abd pain resolved on citrucel w/in a week or two of restarting citrucel.   No unusual cough, purulent sputum or sinus/hb symptoms on present rx. rec Please see patient coordinator before you leave today  to schedule GI referral for colonoscopy    04/09/2012 f/u ov/Wert cc cough since 9/30 "like a cold" with mostly dry cough and some chills and nausea but no decrease in po intake then woke up 730 pm 10/1 (normally wakes at 1030pm to go to work) feeling very chilled couldn't move/ speak x 3-5 min then went back to sleep then woke up at 1030 pm and went to work - L shoulder felt like  a "cold in the chest"(hurt some to cough) not sob -at work had temp of 100.1 and sore throat. No sob, no ha or vomiting or gi/gu symptoms. cxr c/w ? pna rec Doxycline 100 mg twice daily x 10 days Mucinex dm for cough as per your calendar   04/29/2012 f/u ov/Wert cc almost completely better, back at work min cough, no sore throat or L shoulder pain. >>no changes   06/10/2012 Follow up    est med calendar - pt brought all meds with him today.  reports breathing is doing well. We reviewed all meds and organized them into a med calendar , appears to be  taking meds correctly Labs last ov showed A1C 10.4 despite glucovance increase .  We discussed diet control. BS average 180-200 at home.  rec Add Januvia 50mg  daily in am when you wake up.  Call if blood sugars >80 on consistent basis .  Low sweet diet.  Follow up Dr. Sherene Sires  In 6 weeks   07/22/2012 f/u ov/Wert cc sugars too low after fasting since on 50 mg januvia, did not bring calendar. No tia or claudication, no cp or sob. rec Reduce Januvia  50 mg to one half daily and edit your calendar When you get your new prescription for Januvia = 25 mg take one daily   10/21/2012 f/u ov/Wert re hbp, dm, new skin changes Chief Complaint  Patient presents with  . Follow-up    pt wants referrall for knot on his left ear, and "cancer spots" on his arms.  cbg's 120-180 fasting.  No cp, sob, claudication/ tia >no changes   01/27/2013 Follow up and med review  Returns for follow up and med review .  We reviewed all his meds and organized them into a med calendar with pt education. Appears he is taking meds correctly .   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    Needs new glucometer. Not accurate . No polyuria/polydipsia . Last A1C holding at 8.9. We discussed diet. Does band exercises daily for shoulder, walks daily.    Tripped going over fence falling onto left knee 6 weeks ago.  Seen at urgent care with reported neg for fx. No records available. Had a lot of bruising that is resolved. Still has some pain along knee and throbbing in toes at times.  No calf pain or swelling. No redness . No fever.     ROS  The following are not active complaints unless bolded sore throat, dysphagia, dental problems, itching, sneezing,  nasal congestion or excess/ purulent secretions, ear ache,   fever, chills, sweats, unintended wt loss, pleuritic or exertional cp, hemoptysis,  orthopnea pnd or leg swelling, presyncope, palpitations, heartburn, abdominal pain, anorexia,  nausea, vomiting, diarrhea  or change in bowel or urinary habits, change in stools or urine, dysuria,hematuria,  rash, arthralgias, visual complaints, headache, numbness weakness or ataxia or problems with walking or coordination,  change in mood/affect or memory.               Past Medical History:  ACTINIC KERATOSIS (ICD-702.0)  Referred to [facility] 06/27/2007  PAIN IN JOINT, SITE UNSPECIFIED (ICD-719.40)...............................................[place]  HYPERLIPIDEMIA (ICD-272.4)  - Target LDL < 70 due to DM  OBESITY NOS (ICD-278.00)  - target weight 190, ideal < 171, peak 228 October 21, 2007  - REC: return to nutrition with food diary 07/01/08 > declined referral  DM (ICD-250.00)  A1C 9.0>8.0 01/05/10 Glucovance 2.5mg  /500mg  three times  a day , Actos stopped > 9.2 April 11, 2010  HYPERTENSION (ICD-401.9)  HEALTH MAINTENANCE.....................................................................Marland KitchenWert  - Pneumovax 04/29/2012  - Td 06/27/2007 - Colonoscopy letter sent 09/19/06, reviewed with pt 07/01/08> > referred again October 03, 2009 >did not make appt > re-ordered 01/24/2012 > reminded 07/22/2012 >referred 01/27/2013  - CPX 01/24/2012            Objective:   Physical Exam  Ambulatory healthy appearing obese white male in no acute distress.  wt 227 07/01/08 > 223 May 30, 2009  > 209 10/18/2010 > 10/10/2011  198 > 01/24/2012 199> 04/09/2012  194 > 196 04/29/2012 >192 06/10/12  > 197 07/22/2012 > 10/21/2012  197  >199 01/27/2013  HEENT: nl dentition, and orophanx. mod non-specific turbinate edema. Nl external ear canals without cough reflex  Neck without JVD/Nodes/TM. Carotid upstrokes brsk with no bruits.  Lungs diminished BS in bases  RRR no s3 or murmur or increase in P2. No edema  Abd soft and benign with nl excursion in the supine position. No bruits or organomegaly  Ext warm without calf tenderness, cyanosis clubbing Venous insufficiency changes , neg homans sign. nml knee  ROM  and gait  MS nl gait, no restrictions Skin:   No rash, scattered ak's face and ears     CXR  04/29/2012 :   Resolved left lower lobe infiltrate. No acute cardiopulmonary process.          Assessment & Plan:

## 2013-01-27 NOTE — Addendum Note (Signed)
Addended by: Boone Master E on: 01/27/2013 09:51 AM   Modules accepted: Orders

## 2013-01-27 NOTE — Assessment & Plan Note (Signed)
A1C not at goal but hypoglycemia on higher doses ,  Recheck labs today , consider adding lantus if A1C is not lower  Advised on diet

## 2013-01-27 NOTE — Assessment & Plan Note (Signed)
Left Knee Pain - s/p fall ~6 weeks ago.  W/up at urgent care with neg xray.  Exam is unrevealing . Advised to use ice Twice daily  As needed   If not improving or resolve to call back for sooner follow up  , May need ortho referral.

## 2013-01-27 NOTE — Assessment & Plan Note (Signed)
At goal  Diet and exercise discussed

## 2013-01-27 NOTE — Addendum Note (Signed)
Addended by: Boone Master E on: 01/27/2013 01:22 PM   Modules accepted: Orders, Medications

## 2013-01-27 NOTE — Assessment & Plan Note (Signed)
Controlled on rx   

## 2013-02-02 NOTE — Addendum Note (Signed)
Addended by: Boone Master E on: 02/02/2013 12:54 PM   Modules accepted: Orders

## 2013-02-05 NOTE — Progress Notes (Signed)
Quick Note:  Called spoke with spouse. She stated that pt does take his Glucovance and Januvia daily as directed. Advised that d/t DM not being managed with these medications, it appears that it is time to change therapy to insulin. Spouse stated that she will discuss this with patient and have him call the office. ______

## 2013-02-10 ENCOUNTER — Encounter: Payer: Self-pay | Admitting: Adult Health

## 2013-02-10 ENCOUNTER — Other Ambulatory Visit: Payer: Self-pay | Admitting: Adult Health

## 2013-02-10 DIAGNOSIS — E119 Type 2 diabetes mellitus without complications: Secondary | ICD-10-CM

## 2013-03-11 ENCOUNTER — Encounter: Payer: Self-pay | Admitting: Endocrinology

## 2013-03-11 ENCOUNTER — Ambulatory Visit (INDEPENDENT_AMBULATORY_CARE_PROVIDER_SITE_OTHER): Payer: 59 | Admitting: Endocrinology

## 2013-03-11 VITALS — BP 110/80 | HR 69 | Temp 98.6°F | Resp 12 | Ht 67.0 in | Wt 193.8 lb

## 2013-03-11 DIAGNOSIS — E785 Hyperlipidemia, unspecified: Secondary | ICD-10-CM

## 2013-03-11 DIAGNOSIS — I10 Essential (primary) hypertension: Secondary | ICD-10-CM

## 2013-03-11 MED ORDER — SITAGLIP PHOS-METFORMIN HCL ER 50-1000 MG PO TB24: 100.0000 mg | ORAL_TABLET | Freq: Every day | ORAL | Status: DC

## 2013-03-11 MED ORDER — CANAGLIFLOZIN 300 MG PO TABS: 300.0000 mg | ORAL_TABLET | Freq: Every day | ORAL | Status: DC

## 2013-03-11 NOTE — Patient Instructions (Addendum)
Please check blood sugars at least half the time about 2 hours after any meal and as directed on waking up. Please bring blood sugar monitor to each visit  Stop metformin glyburide and Januvia  Invokana 100 mg daily for 5 days And then 300 mg daily at the same time daily  Janumet XR 50/1000, 2 tablets daily, once a day, preferably with a meal  Increase walking to at least 20-30 minutes every other day  Do not take Benicar until next visit

## 2013-03-11 NOTE — Progress Notes (Signed)
Patient ID: [name] Little, male   DOB: 08/12/1950, 62 y.o.   MRN: [id number]    Reason for Appointment : Consultation for Type 2 Diabetes  History of Present Illness          Diagnosis: Type 2 diabetes mellitus, date of diagnosis: 1994   Past history: He has been treated for his diabetes with Glucovance for many years  with variable control Also appears to have had minimal diabetes education and has been monitoring his blood sugars only sporadically Review of his A1c shows he has had levels of at least 8% since 2009 and high levels in the last 2 years  Recent history: Januvia was added in 1/14, but he is taking only 25 mg and his A1c has not improved, recently has increased His blood sugars also have been variable at different times of day. He may occasionally even have hypoglycemia and other times may have glucose readings over 250. He is taking a complicated regimen of Glucovance 1-1/2 tablets in the morning and also at lunchtime and half tablet at suppertime    Glucose monitoring:  done 2-3  days a week only        Glucometer: One Touch.      Blood Glucose readings from recall: 80- >250  and checking mostly before supper      Hypoglycemia frequency: Never.           Self-care: The diet that the patient has been following NG:EXBMW to limit carbohydrates.      Meals: 3 meals per day. He works third shift and has been able meal times, eating out 3-4 times a week at [employer] He is concerned about his difficulty losing weight Physical activity: exercise:.10-15 min 3./7          Dietician visit: Most recent: 12 years ago               Oral hypoglycemic drugs the patient is taking are: Glucovance, Januvia        Side effects from medications have been none except hypoglycemia   Compliance with the medical regimen:  fair Retinal exam: Most recent: 2013.    Component     Latest Ref Rng 03/17/2008 07/01/2008 09/28/2008 03/08/2009 05/30/2009  Hemoglobin A1C     4.6 - 6.5 % 8.0 (H)  8.0 (H) 8.1 (H) 7.8 (H) 7.8 (H)   Component     Latest Ref Rng 10/03/2009 01/05/2010 04/11/2010 10/18/2010 10/10/2011  Hemoglobin A1C     4.6 - 6.5 % 9.0 (H) 8.0 (H) 9.2 (H) 10.8 (H) 10.5 (H)   Component     Latest Ref Rng 01/24/2012 04/29/2012 07/22/2012 10/21/2012 01/27/2013  Hemoglobin A1C     4.6 - 6.5 % 11.0 (H) 10.4 (H) 8.9 (H) 8.9 (H) 9.7 (H)    Lab Results  Component Value Date   HGBA1C 9.7* 01/27/2013    Wt Readings from Last 3 Encounters:  03/11/13 193 lb 12.8 oz (87.907 kg)  01/27/13 199 lb 9.6 oz (90.538 kg)  10/21/12 197 lb 3.2 oz (89.449 kg)       Medication List       This list is accurate as of: 03/11/13 10:48 AM.  Always use your most recent med list.               aspirin 81 MG tablet  Take 81 mg by mouth at bedtime.     BENICAR 20 MG tablet  Generic drug:  olmesartan     dextromethorphan-guaiFENesin  30-600 MG per 12 hr tablet  Commonly known as:  MUCINEX DM  Take 1 tablet by mouth every 12 (twelve) hours as needed.     freestyle lancets  28 GAUGE. USE AS DIRECTED.     FREESTYLE TEST STRIPS test strip  Generic drug:  glucose blood  TEST AS DIRECTED     glyBURIDE-metformin 2.5-500 MG per tablet  Commonly known as:  GLUCOVANCE  Take 1 1/2 tablet with first meal, 1 1/2 tablet with second meal, and 1/2 tablet with last meal     ibuprofen 200 MG tablet  Commonly known as:  ADVIL,MOTRIN  3 tabs w/ meals every 8 hours as needed     mometasone 50 MCG/ACT nasal spray  Commonly known as:  NASONEX  2 puffs twice daily x5 days before Nasonex     ONE TOUCH ULTRA SYSTEM KIT W/DEVICE Kit  Use to test once daily     oxymetazoline 0.05 % nasal spray  Commonly known as:  AFRIN  2 puffs twice daily as needed for 5 days before nasonex as needed     SIMCOR 500-20 MG 24 hr tablet  Generic drug:  niacin-simvastatin  TAKE 1 TABLET BY MOUTH AT BEDTIME.     sitaGLIPtin 25 MG tablet  Commonly known as:  JANUVIA  Take 1 tablet (25 mg total) by mouth daily.         Allergies: No Known Allergies  Past Medical History  Diagnosis Date  . Actinic keratosis   . Other and unspecified hyperlipidemia   . Obesity, unspecified   . Type II or unspecified type diabetes mellitus without mention of complication, not stated as uncontrolled   . Unspecified essential hypertension     Past Surgical History  Procedure Laterality Date  . Rotator cuff repair      Family History  Problem Relation Age of Onset  . COPD Father   . Diabetes Mother   . Cancer Brother   . Colon cancer Neg Hx     Social History:  reports that he quit smoking about 14 years ago. His smoking use included Cigars. He has never used smokeless tobacco. He reports that he does not drink alcohol. His drug history is not on file.    Review of Systems       Lipids:  he has had diabetic dyslipidemia and taking Simcor low-dose       No unusual headaches.   ENT: he has had history of allergic rhinitis               Skin: No rash or infections     Thyroid:  No  unusual fatigue.     The blood pressure has been  treated with Benicar, not taking only 10 mg     No swelling of feet.     No shortness of breath on exertion.     Bowel habits:  No change.                    Heartburn/pain: no.       No frequency of urination or nocturia       No joint  pains.         No history of Numbness, tingling or burning in feet    No visits with results within 1 Week(s) from this visit. Latest known visit with results is:  Appointment on 01/27/2013  Component Date Value Range Status  . Sodium 01/27/2013 136  135 - 145 mEq/L Final  .  Potassium 01/27/2013 4.1  3.5 - 5.1 mEq/L Final  . Chloride 01/27/2013 101  96 - 112 mEq/L Final  . CO2 01/27/2013 26  19 - 32 mEq/L Final  . Glucose, Bld 01/27/2013 247* 70 - 99 mg/dL Final  . BUN 16/04/9603 18  6 - 23 mg/dL Final  . Creatinine, Ser 01/27/2013 0.9  0.4 - 1.5 mg/dL Final  . Calcium 54/03/8118 9.5  8.4 - 10.5 mg/dL Final  . GFR 14/78/2956  88.59  >60.00 mL/min Final  . Hemoglobin A1C 01/27/2013 9.7* 4.6 - 6.5 % Final   Glycemic Control Guidelines for People with Diabetes:Non Diabetic:  <6%Goal of Therapy: <7%Additional Action Suggested:  >8%     Physical Examination:  BP 100/64  Pulse 69  Temp(Src) 98.6 F (37 C)  Resp 12  Ht 5\' 7"  (1.702 m)  Wt 193 lb 12.8 oz (87.907 kg)  BMI 30.35 kg/m2  SpO2 98%  GENERAL:         Patient appears to have mild generalized obesity.   HEENT:         Eye exam shows normal external appearance. Fundus exam shows no retinopathy. Oral exam shows normal mucosa and tongue.  NECK:         General:  Neck exam shows no lymphadenopathy. Carotids are normal to palpation and no bruit heard. Thyroid is not enlarged and no nodules felt.   LUNGS:         Chest is symmetrical. Lungs are clear to auscultation.Marland Kitchen   HEART:         Heart sounds:  S1 and S2 are normal. No murmurs or clicks heard., no S3 or S4.   ABDOMEN:         General:  There is no distention present. Liver and spleen are not palpable. No other mass or tenderness present.  EXTREMITIES:     There is no pedal edema. No skin lesions present.  NEUROLOGICAL:        Vibration sense is mildly reduced in toes. Ankle jerks are absent bilaterally.          Diabetic foot exam:            Inspection  Normal    Monofilament  Normal  PEDAL pulses: Normal posterior tibialis MUSCULOSKELETAL:       There is no enlargement or deformity of the joints. Spine is normal to inspection.Marland Kitchen   SKIN:       No rash or skin lesions/ulcerations    ASSESSMENT:  Diabetes type 2, uncontrolled  He has had long-standing diabetes which has been persistently poorly controlled and A1c has not been below 8% historically from the information available since 2009. Last A1c 9.7 and he has been on the same treatment regimen for years  However surprisingly he has minimal diabetes complications Since he appears to have variable blood sugars with using glyburide 7.5 mg a day this  should be stopped and alternative drugs used Also currently is on subtherapeutic doses of Januvia of 25 mg only  He is tolerating metformin and has normal renal function, will need to continue this at 2000 mg a day Will increase his Januvia to therapeutic dose of 100 mg daily For simplicity will change him to Janumet XR 50/1000, 2 tablets daily once a day In place of glyburide will use Invokana 300 mg daily after starting him on a sample of 100 mg daily This should help his overall control and more consistently than glyburide without tendency to hypoglycemia. Discussed how  this works, benefits, effects on glucose, weight loss, blood pressure and possible side effects. He is also wanting to cut back on his portions which he has had difficulty with because of tendency to hypoglycemia Because of his low normal blood pressure will hold his low dose Benicar until his next visit and he can continue monitoring blood pressure at work  Also appears to be more motivated to increase exercise and encouraged him to do so more frequently He does need more diabetes education and this will be scheduled, especially with meal planning as he is eating out frequently  Discussed timing of glucose monitoring and glucose targets; he will bring his monitor for download on the next visit  Complications: None evident at present but needs urine microalbumin retested  HYPERTENSION: This is very mild and blood pressure is excellent, because of starting Invokana will hold his Benicar until next visit   HYPERLIPIDEMIA: Followed by PCP  [facility] 03/11/2013, 10:48 AM

## 2013-03-26 ENCOUNTER — Other Ambulatory Visit (INDEPENDENT_AMBULATORY_CARE_PROVIDER_SITE_OTHER): Payer: 59

## 2013-03-26 DIAGNOSIS — IMO0001 Reserved for inherently not codable concepts without codable children: Secondary | ICD-10-CM

## 2013-03-26 LAB — MICROALBUMIN / CREATININE URINE RATIO
Creatinine,U: 35.8 mg/dL
Microalb Creat Ratio: 0.3 mg/g (ref 0.0–30.0)

## 2013-03-26 LAB — BASIC METABOLIC PANEL
CO2: 24 mEq/L (ref 19–32)
Glucose, Bld: 142 mg/dL — ABNORMAL HIGH (ref 70–99)
Potassium: 4.2 mEq/L (ref 3.5–5.1)
Sodium: 134 mEq/L — ABNORMAL LOW (ref 135–145)

## 2013-03-31 ENCOUNTER — Encounter: Payer: Self-pay | Admitting: Endocrinology

## 2013-03-31 ENCOUNTER — Ambulatory Visit (INDEPENDENT_AMBULATORY_CARE_PROVIDER_SITE_OTHER): Payer: 59 | Admitting: Endocrinology

## 2013-03-31 VITALS — BP 110/70 | HR 72 | Temp 98.7°F | Wt 192.5 lb

## 2013-03-31 DIAGNOSIS — Z23 Encounter for immunization: Secondary | ICD-10-CM

## 2013-03-31 NOTE — Progress Notes (Signed)
Patient ID: [name] Little, male   DOB: 23-Jun-1951, 62 y.o.   MRN: [id number]    Reason for Appointment : Consultation for Type 2 Diabetes  History of Present Illness          Diagnosis: Type 2 diabetes mellitus, date of diagnosis: 1994   Past history: He has been treated for his diabetes with Glucovance for many years  with variable control Also appears to have had minimal diabetes education and has been monitoring his blood sugars only sporadically Review of his A1c shows he has had levels of at least 8% since 2009 and high levels in the last 2 years. On his regimen of glyburide/metformin and low dose Januvia his blood sugars were fluctuating significantly ranging from 80-200+ and also occasional symptoms of hypoglycemia  Recent history:  His glyburide was stopped on his last visit because of significant variability in blood sugars and tendency to hypoglycemia. Also his Januvia was increased from 25 mg daily therapeutic dose of 100 mg. Also to simplify his regimen he was given Janumet XR maximum dose metformin Invokana was started add 100 mg and increased to 300 mg. This was tolerated very well. He is taking his medications when he wakes up before his work. starts late at night.  For some reason his blood sugars are relatively higher when he is coming back from work but better on waking up. However overall his blood sugars are significantly improved and his median reading at home is down to 134 and as low as 113 on waking up    Glucose monitoring:  done 2-3  days a week only        Glucometer: One Touch.      Blood Glucose readings from download: Around 10-11 PM (his morning time) range 69-137, not below 100 recently Other nonfasting readings are 72-209 with only occasional readings over 150. Also a family member uses his meter occasionally      Hypoglycemia frequency:  none recently.           Self-care: The diet that the patient has been following [id number] to limit carbohydrates.      Meals: 3 meals per day. He works third shift and has been able meal times, eating out 3-4 times a week at [employer] He is concerned about his difficulty losing weight Physical activity: exercise:.10-15 min 3./7 days          Dietician visit: Most recent: 12 years ago               Oral hypoglycemic drugs the patient is taking are: Janumet XR, Invokana      Side effects from medications have been none except hypoglycemia   Compliance with the medical regimen:  fair Retinal exam: Most recent: 2013.    Component     Latest Ref Rng 03/17/2008 07/01/2008 09/28/2008 03/08/2009 05/30/2009  Hemoglobin A1C     4.6 - 6.5 % 8.0 (H) 8.0 (H) 8.1 (H) 7.8 (H) 7.8 (H)   Component     Latest Ref Rng 10/03/2009 01/05/2010 04/11/2010 10/18/2010 10/10/2011  Hemoglobin A1C     4.6 - 6.5 % 9.0 (H) 8.0 (H) 9.2 (H) 10.8 (H) 10.5 (H)   Component     Latest Ref Rng 01/24/2012 04/29/2012 07/22/2012 10/21/2012 01/27/2013  Hemoglobin A1C     4.6 - 6.5 % 11.0 (H) 10.4 (H) 8.9 (H) 8.9 (H) 9.7 (H)    Lab Results  Component Value Date   HGBA1C 9.7* 01/27/2013  Wt Readings from Last 3 Encounters:  03/31/13 192 lb 8 oz (87.317 kg)  03/11/13 193 lb 12.8 oz (87.907 kg)  01/27/13 199 lb 9.6 oz (90.538 kg)       Medication List       This list is accurate as of: 03/31/13  9:41 AM.  Always use your most recent med list.               aspirin 81 MG tablet  Take 81 mg by mouth at bedtime.     BENICAR 20 MG tablet  Generic drug:  olmesartan     Canagliflozin 300 MG Tabs  Commonly known as:  INVOKANA  Take 1 tablet (300 mg total) by mouth daily.     dextromethorphan-guaiFENesin 30-600 MG per 12 hr tablet  Commonly known as:  MUCINEX DM  Take 1 tablet by mouth every 12 (twelve) hours as needed.     freestyle lancets  28 GAUGE. USE AS DIRECTED.     FREESTYLE TEST STRIPS test strip  Generic drug:  glucose blood  TEST AS DIRECTED     glyBURIDE-metformin 2.5-500 MG per tablet  Commonly known as:   GLUCOVANCE  Take 1 1/2 tablet with first meal, 1 1/2 tablet with second meal, and 1/2 tablet with last meal     ibuprofen 200 MG tablet  Commonly known as:  ADVIL,MOTRIN  3 tabs w/ meals every 8 hours as needed     mometasone 50 MCG/ACT nasal spray  Commonly known as:  NASONEX  2 puffs twice daily x5 days before Nasonex     ONE TOUCH ULTRA SYSTEM KIT W/DEVICE Kit  Use to test once daily     oxymetazoline 0.05 % nasal spray  Commonly known as:  AFRIN  2 puffs twice daily as needed for 5 days before nasonex as needed     SIMCOR 500-20 MG 24 hr tablet  Generic drug:  niacin-simvastatin  TAKE 1 TABLET BY MOUTH AT BEDTIME.     sitaGLIPtin 25 MG tablet  Commonly known as:  JANUVIA  Take 1 tablet (25 mg total) by mouth daily.     SitaGLIPtin-MetFORMIN HCl 50-1000 MG Tb24  Commonly known as:  JANUMET XR  Take 100 mg by mouth daily.        Allergies: No Known Allergies  Past Medical History  Diagnosis Date  . Actinic keratosis   . Other and unspecified hyperlipidemia   . Obesity, unspecified   . Type II or unspecified type diabetes mellitus without mention of complication, not stated as uncontrolled   . Unspecified essential hypertension     Past Surgical History  Procedure Laterality Date  . Rotator cuff repair      Family History  Problem Relation Age of Onset  . COPD Father   . Diabetes Mother   . Cancer Brother   . Colon cancer Neg Hx     Social History:  reports that he quit smoking about 14 years ago. His smoking use included Cigars. He has never used smokeless tobacco. He reports that he does not drink alcohol. His drug history is not on file.    Review of Systems       Lipids:  he has had diabetic dyslipidemia and taking Simcor low-dose      The blood pressure has been  treated with Benicar, now taking only 10 mg         No history of Numbness, tingling or burning in feet    Appointment on  03/26/2013  Component Date Value Range Status  .  Fructosamine 03/26/2013 389* <285 umol/L Final   Comment:                            Variations in levels of serum proteins (albumin and immunoglobulins)                          may affect fructosamine results.                             . Sodium 03/26/2013 134* 135 - 145 mEq/L Final  . Potassium 03/26/2013 4.2  3.5 - 5.1 mEq/L Final  . Chloride 03/26/2013 101  96 - 112 mEq/L Final  . CO2 03/26/2013 24  19 - 32 mEq/L Final  . Glucose, Bld 03/26/2013 142* 70 - 99 mg/dL Final  . BUN 95/62/1308 19  6 - 23 mg/dL Final  . Creatinine, Ser 03/26/2013 1.0  0.4 - 1.5 mg/dL Final  . Calcium 65/78/4696 9.1  8.4 - 10.5 mg/dL Final  . GFR 29/52/8413 78.60  >60.00 mL/min Final  . Microalb, Ur 03/26/2013 0.1  0.0 - 1.9 mg/dL Final  . Creatinine,U 24/40/1027 35.8   Final  . Microalb Creat Ratio 03/26/2013 0.3  0.0 - 30.0 mg/g Final    Physical Examination:  BP 110/70  Pulse 72  Temp(Src) 98.7 F (37.1 C) (Oral)  Wt 192 lb 8 oz (87.317 kg)  BMI 30.14 kg/m2  SpO2 95%  No pedal edema    ASSESSMENT:  Diabetes type 2, uncontrolled   He is tolerating his new regimen of maximum dose metformin and also Invokana 300 mg that was started on his last visit. His blood sugars are looking better now with most readings below 150 although he is not checking readings after meals much Not clear why his fructosamine level is still relatively high Discussed need to increase exercise and encouraged him to do so more frequently He does need more diabetes education and this will be scheduled, especially with meal planning as he is eating out frequently  Discussed timing of glucose monitoring especially 2 hours after meals and glucose targets Also he will try to take his Janumet XR at bedtime and continue taking Invokana before breakfast  Complications: None evident at present including normal urine microalbumin   HYPERTENSION: This is very mild and blood pressure is low normal, because of starting Invokana  will stop his Benicar and monitor blood pressure  HYPERLIPIDEMIA: Followed by PCP  [facility] 03/31/2013, 9:41 AM

## 2013-03-31 NOTE — Patient Instructions (Addendum)
Leave off Benicar and take Janumet at bedtime  Please check blood sugars at least half the time about 2 hours after any meal and as directed on waking up. Please bring blood sugar monitor to each visit

## 2013-04-01 ENCOUNTER — Other Ambulatory Visit: Payer: Self-pay | Admitting: Internal Medicine

## 2013-04-20 ENCOUNTER — Encounter: Payer: 59 | Admitting: Internal Medicine

## 2013-05-13 ENCOUNTER — Ambulatory Visit: Payer: 59 | Admitting: Endocrinology

## 2013-05-14 ENCOUNTER — Other Ambulatory Visit (INDEPENDENT_AMBULATORY_CARE_PROVIDER_SITE_OTHER): Payer: 59

## 2013-05-14 LAB — BASIC METABOLIC PANEL
BUN: 20 mg/dL (ref 6–23)
CO2: 25 mEq/L (ref 19–32)
Calcium: 9.3 mg/dL (ref 8.4–10.5)
Chloride: 99 mEq/L (ref 96–112)
Creatinine, Ser: 1 mg/dL (ref 0.4–1.5)
GFR: 76.83 mL/min (ref >60.00)
Glucose, Bld: 164 mg/dL — ABNORMAL HIGH (ref 70–99)
Potassium: 4.4 mEq/L (ref 3.5–5.1)
Sodium: 134 mEq/L — ABNORMAL LOW (ref 135–145)

## 2013-05-14 LAB — HEMOGLOBIN A1C: Hgb A1c MFr Bld: 9.8 % — ABNORMAL HIGH (ref 4.6–6.5)

## 2013-05-15 ENCOUNTER — Ambulatory Visit: Payer: 59 | Admitting: Internal Medicine

## 2013-05-19 ENCOUNTER — Ambulatory Visit (INDEPENDENT_AMBULATORY_CARE_PROVIDER_SITE_OTHER): Payer: 59 | Admitting: Endocrinology

## 2013-05-19 ENCOUNTER — Encounter: Payer: 59 | Attending: Endocrinology | Admitting: Nutrition

## 2013-05-19 ENCOUNTER — Encounter: Payer: Self-pay | Admitting: Endocrinology

## 2013-05-19 VITALS — BP 130/76 | HR 78 | Temp 98.3°F | Resp 12 | Ht 68.0 in | Wt 187.2 lb

## 2013-05-19 DIAGNOSIS — IMO0001 Reserved for inherently not codable concepts without codable children: Secondary | ICD-10-CM | POA: Insufficient documentation

## 2013-05-19 DIAGNOSIS — E785 Hyperlipidemia, unspecified: Secondary | ICD-10-CM

## 2013-05-19 DIAGNOSIS — Z713 Dietary counseling and surveillance: Secondary | ICD-10-CM | POA: Insufficient documentation

## 2013-05-19 DIAGNOSIS — I10 Essential (primary) hypertension: Secondary | ICD-10-CM

## 2013-05-19 MED ORDER — LIRAGLUTIDE 18 MG/3ML ~~LOC~~ SOPN: 1.2000 mg | PEN_INJECTOR | Freq: Every day | SUBCUTANEOUS | Status: DC

## 2013-05-19 NOTE — Progress Notes (Signed)
Patient ID: [name] Little, male   DOB: [birth year], 61 y.o.   MRN: [id number]    Reason for Appointment : Followup for Type 2 Diabetes  History of Present Illness          Diagnosis: Type 2 diabetes mellitus, date of diagnosis: 1994   Past history: He has been treated for his diabetes with Glucovance for many years  with variable control Also appears to have had minimal diabetes education and has been monitoring his blood sugars only sporadically Review of his A1c shows he has had levels of at least 8% since 2009 and high levels in the last 2 years. On his regimen of glyburide/metformin and low dose Januvia his blood sugars were fluctuating significantly ranging from 80-200+ and also occasional symptoms of hypoglycemia His glyburide was stoppedbecause of significant variability in blood sugars and tendency to hypoglycemia. Also his Januvia was increased from 25 mg daily therapeutic dose of 100 mg.  Recent history:  He has been taking Janumet XR with maximum dose metformin Invokana was started add 100 mg and increased to 300 mg.   He is taking his medications consistently with Invokana in the morning before his meal and Janumet XR after his breakfast at night Again his blood sugars are relatively higher when he is coming back from work but better on waking up.  Although his blood sugars at home are not overall very high his A1c is still not improved compared to 7/14    Glucose monitoring:  about once a day        Glucometer: One Touch.      Blood Glucose readings from download: Around 10-11 PM (his morning time) range 90-165 with median 124 Other nonfasting readings are 134-225 with median before none of about 185 Hypoglycemia frequency:  none recently.           Self-care: The diet that the patient has been following is: tries to limit carbohydrates.      Meals: 3 meals per day. He works third shift and has variable  meal times; main meal at 10 pm; lunch 4 am;  eating out 3-4 times a week  at [employer] He is concerned about his difficulty losing weight Physical activity: exercise: Some walking. Not very active at work        Dietician visit: Most recent: 12 years ago, has not made followup appointment               Oral hypoglycemic drugs the patient is taking are: Janumet XR, Invokana      Side effects from medications have been none except hypoglycemia   Compliance with the medical regimen:  fair Retinal exam: Most recent: 2013.    Component     Latest Ref Rng 03/17/2008 07/01/2008 09/28/2008 03/08/2009 05/30/2009  Hemoglobin A1C     4.6 - 6.5 % 8.0 (H) 8.0 (H) 8.1 (H) 7.8 (H) 7.8 (H)   Component     Latest Ref Rng 10/03/2009 01/05/2010 04/11/2010 10/18/2010 10/10/2011  Hemoglobin A1C     4.6 - 6.5 % 9.0 (H) 8.0 (H) 9.2 (H) 10.8 (H) 10.5 (H)   Component     Latest Ref Rng 01/24/2012 04/29/2012 07/22/2012 10/21/2012 01/27/2013  Hemoglobin A1C     4.6 - 6.5 % 11.0 (H) 10.4 (H) 8.9 (H) 8.9 (H) 9.7 (H)    Lab Results  Component Value Date   HGBA1C 9.8* 05/14/2013    Wt Readings from Last 3 Encounters:  05/19/13 187 lb 3.2  oz (84.913 kg)  03/31/13 192 lb 8 oz (87.317 kg)  03/11/13 193 lb 12.8 oz (87.907 kg)      Medication List       This list is accurate as of: 05/19/13  9:01 AM.  Always use your most recent med list.               aspirin 81 MG tablet  Take 81 mg by mouth at bedtime.     BENICAR 20 MG tablet  Generic drug:  olmesartan     Canagliflozin 300 MG Tabs  Commonly known as:  INVOKANA  Take 1 tablet (300 mg total) by mouth daily.     cefdinir 300 MG capsule  Commonly known as:  OMNICEF     dextromethorphan-guaiFENesin 30-600 MG per 12 hr tablet  Commonly known as:  MUCINEX DM  Take 1 tablet by mouth every 12 (twelve) hours as needed.     freestyle lancets  28 GAUGE. USE AS DIRECTED.     HYDROcodone-homatropine 5-1.5 MG/5ML syrup  Commonly known as:  HYCODAN     ibuprofen 200 MG tablet  Commonly known as:  ADVIL,MOTRIN  3 tabs  w/ meals every 8 hours as needed     LUMIGAN 0.01 % Soln  Generic drug:  bimatoprost     mometasone 50 MCG/ACT nasal spray  Commonly known as:  NASONEX  2 puffs twice daily x5 days before Nasonex     ONE TOUCH ULTRA SYSTEM KIT W/DEVICE Kit  Use to test once daily     oxymetazoline 0.05 % nasal spray  Commonly known as:  AFRIN  2 puffs twice daily as needed for 5 days before nasonex as needed     SIMCOR 500-20 MG 24 hr tablet  Generic drug:  niacin-simvastatin  TAKE 1 TABLET BY MOUTH AT BEDTIME.     SitaGLIPtin-MetFORMIN HCl 50-1000 MG Tb24  Commonly known as:  JANUMET XR  Take 100 mg by mouth daily.        Allergies: No Known Allergies  Past Medical History  Diagnosis Date  . Actinic keratosis   . Other and unspecified hyperlipidemia   . Obesity, unspecified   . Type II or unspecified type diabetes mellitus without mention of complication, not stated as uncontrolled   . Unspecified essential hypertension     Past Surgical History  Procedure Laterality Date  . Rotator cuff repair      Family History  Problem Relation Age of Onset  . COPD Father   . Diabetes Mother   . Cancer Brother   . Colon cancer Neg Hx     Social History:  reports that he quit smoking about 14 years ago. His smoking use included Cigars. He has never used smokeless tobacco. He reports that he does not drink alcohol. His drug history is not on file.    Review of Systems       Lipids:  he has had diabetic dyslipidemia and taking Simcor low-dose      The blood pressure has been  treated with Benicar, now taking only 10 mg         No history of Numbness, tingling or burning in feet    Appointment on 05/14/2013  Component Date Value Range Status  . Sodium 05/14/2013 134* 135 - 145 mEq/L Final  . Potassium 05/14/2013 4.4  3.5 - 5.1 mEq/L Final  . Chloride 05/14/2013 99  96 - 112 mEq/L Final  . CO2 05/14/2013 25  19 - 32 mEq/L  Final  . Glucose, Bld 05/14/2013 164* 70 - 99 mg/dL Final  .  BUN 16/04/9603 20  6 - 23 mg/dL Final  . Creatinine, Ser 05/14/2013 1.0  0.4 - 1.5 mg/dL Final  . Calcium 54/03/8118 9.3  8.4 - 10.5 mg/dL Final  . GFR 14/78/2956 76.83  >60.00 mL/min Final  . Hemoglobin A1C 05/14/2013 9.8* 4.6 - 6.5 % Final   Glycemic Control Guidelines for People with Diabetes:Non Diabetic:  <6%Goal of Therapy: <7%Additional Action Suggested:  >8%     Physical Examination:  BP 130/76  Pulse 78  Temp(Src) 98.3 F (36.8 C)  Resp 12  Ht 5\' 8"  (1.727 m)  Wt 187 lb 3.2 oz (84.913 kg)  BMI 28.47 kg/m2  SpO2 97%  No pedal edema    ASSESSMENT:  Diabetes type 2, uncontrolled   His blood sugars are still overall about the same with high postprandial readings. Even though he has only occasional readings at home over 200 his A1c is still over 9% as it was in 7/14 Most likely he is having progressive decline in insulin secretion He is quite reluctant to consider insulin at this time Since fasting blood sugars are generally normal and he had marked variability with sulfonylurea drugs previously was try him on Victoza  Discussed with the patient the use of GLP-1 drugs and the mechanism of how they work and benefit blood glucose as well as potentially help with weight loss, increase satiety and gastric fullness. Explained possible side effects and safety information . Discussed dosage titration of Victoza  starting with 0.6 mg and increasing to 1.2 mg, timing of injection and use of the flex pen. Patient brochure and co-pay card given  He will continue with Invokana and switch Janumet metformin ER when this is finished He will discuss his meal planning with nurse educator today along with detailed information on Victoza He believes he can be more active than he is off from work in the next 2 months and this may also help his control Discussed A1c results and implications He will check more readings 2 hours after meals also  HYPERTENSION: Blood pressure is  well-controlled  HYPERLIPIDEMIA: Followed by PCP  [facility] 05/19/2013, 9:01 AM   Addendum: His brother has medullary thyroid carcinoma but no other endocrine tumors. Will review this in detail with patient on next visit

## 2013-05-19 NOTE — Patient Instructions (Addendum)
Start VICTOZA injection with the sample pen once daily at the same time of the day.  Dial the dose to 0.6 mg for the first week.  You may  experience nausea in the first few days which usually gets better the After 1 week increase the dose to 1.2mg  daily if no nausea.  You may inject in the stomach, thigh or arm.   You will feel fullness of the stomach with starting the medication and should try to keep portions of food small.    Please check blood sugars at least half the time about 2 hours after any meal and as directed on waking up. Please bring blood sugar monitor to each visit  When out of JANUMET XR CHANGE TO METFORMIN ER 500MG , FOUR TABS DAILY

## 2013-05-20 NOTE — Progress Notes (Signed)
Pt. was instructed on the use of Victoza.  We discussed this class of drug, how it worked, and the side effects.  He reported good understanding.  He was instructed on how and when to take this drug, and reported good understanding of this.  Written instructions were given to  increase the dose to 1.2 after 7 days if no nausea.  He reported good understanding of this.   Wife says that patient's brother died of Medulary Thyroid cancer.  Dr. Lucianne Muss notified and ok ed patient to start this.   Starter kit given with sample of Victoza, and explanation of how to use the copay card.  Patient had no final questions.

## 2013-05-20 NOTE — Patient Instructions (Signed)
Take 0.6u once a day for 7 days.  If no nausea, increase the dose to 1.2.   Call if questions.

## 2013-05-27 ENCOUNTER — Ambulatory Visit (INDEPENDENT_AMBULATORY_CARE_PROVIDER_SITE_OTHER): Payer: 59 | Admitting: Internal Medicine

## 2013-05-27 ENCOUNTER — Other Ambulatory Visit (INDEPENDENT_AMBULATORY_CARE_PROVIDER_SITE_OTHER): Payer: 59

## 2013-05-27 ENCOUNTER — Telehealth: Payer: Self-pay | Admitting: Endocrinology

## 2013-05-27 ENCOUNTER — Encounter: Payer: Self-pay | Admitting: Internal Medicine

## 2013-05-27 ENCOUNTER — Ambulatory Visit (INDEPENDENT_AMBULATORY_CARE_PROVIDER_SITE_OTHER)
Admission: RE | Admit: 2013-05-27 | Discharge: 2013-05-27 | Disposition: A | Discharge status: Home / Self Care | Payer: 59 | Source: Ambulatory Visit | Attending: Internal Medicine | Admitting: Internal Medicine

## 2013-05-27 VITALS — BP 102/64 | HR 67 | Temp 98.0°F | Ht 67.0 in | Wt 180.0 lb

## 2013-05-27 DIAGNOSIS — E785 Hyperlipidemia, unspecified: Secondary | ICD-10-CM

## 2013-05-27 DIAGNOSIS — I10 Essential (primary) hypertension: Secondary | ICD-10-CM

## 2013-05-27 LAB — LIPID PANEL
HDL: 36.6 mg/dL — ABNORMAL LOW (ref >39.00)
LDL Cholesterol: 65 mg/dL (ref 0–99)
Total CHOL/HDL Ratio: 3
Triglycerides: 98 mg/dL (ref 0.0–149.0)
VLDL: 19.6 mg/dL (ref 0.0–40.0)

## 2013-05-27 LAB — HEPATIC FUNCTION PANEL
ALT: 25 U/L (ref 0–53)
Albumin: 4.3 g/dL (ref 3.5–5.2)
Bilirubin, Direct: 0.2 mg/dL (ref 0.0–0.3)
Total Bilirubin: 1.2 mg/dL (ref 0.3–1.2)

## 2013-05-27 NOTE — Progress Notes (Signed)
Quick Note:  Pt aware of results. ______ 

## 2013-05-27 NOTE — Assessment & Plan Note (Addendum)
-   Target LDL < 70 due to aodm  Lab Results  Component Value Date   CHOL 121 05/27/2013   HDL 36.60* 05/27/2013   LDLCALC 65 05/27/2013   TRIG 98.0 05/27/2013   CHOLHDL 3 05/27/2013    Adequate control on present rx, reviewed > no change in rx needed

## 2013-05-27 NOTE — Telephone Encounter (Signed)
The patient was called to get more information on his family history. Apparently his brother had Fatal medullary thyroid cancer at the age of 60 Has not had any other family members with this cancer or any other tumors Since it is not clear whether this is a genetic condition and the patient has not had control with his other diabetes medications will have him start to Victoza and reassess on the next visit. Patien does not want to start insulin because of his job

## 2013-05-27 NOTE — Assessment & Plan Note (Signed)
Resolved with wt loss, no rx needed

## 2013-05-27 NOTE — Assessment & Plan Note (Signed)
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

## 2013-05-27 NOTE — Progress Notes (Signed)
Subjective:    Patient ID: [name] Little, male    DOB: Jul 28, 1950    MRN: [id number]  Brief patient profile:  64  yowm quit smoking 2000 with morbid obesity achieving a peak all-time weight of 228 complicated by hypertension and diabetes.    History of Present Illness   Nov 22, 2009 Followup fasting for labs. Not following med calendar. Pt c/o lower right side abdominal pain x 2 wks "off and on". He states that pain feels achy and sometimes sharp. lasts few secs better after passes gas. migrates around but most severe rlq, no rad to flank or scrotum, no dysuria or hematuria. --Labs showed A1C 9.0, glucovance was increased to three times a day. rec citrucel  > pain resolved  January 05, 2010--Presents for follow up and med review. He has been doing ok. BS averaging 90-180, he has had only 1 low sugar at 75 when he did not eat. We discussed diet and exercise /weight loss. We reveiwed his meds and organized his meds into a med calendar. rec Stop Actos  Continue on Glucovance 2.5mg /500mg  three times a day  Diabetic diet.  Increase activity as tolerated.  Follow med calendar closely and bring to each vist     2 April 11, 2010 Followup. No med calendar. Cc c/p rt side pain- the same, no better or worse. He states that sometimes after eating feels the need to belch but states this is hard to do. not taking citrucel regulary,  , pain migratory and positional. no anorexia nause or change in bowel/bladder habits rec citrucel > resolved then stopped citrucel  10/18/2010 ov with med calendar in hand but not following, doing cbgs at various times instead of fasting as rec. No overt symptoms of hyper or hypoglycemia.  Pt denies any significant sore throat, dysphagia, itching, sneezing,  nasal congestion or excess/ purulent secretions,  fever, chills, sweats, unintended wt loss, pleuritic or exertional cp, hempoptysis, orthopnea pnd or leg swelling.  No abd pain.   rec Use med calendar Stop advicor when you  finish it and replace it simcor Late add:  Needs to move up Cpx with all meds in hand due to hgb a1c drifting up  10/10/2011 f/u ov/[name] Little here for f/u dm/hbp cc recurrent x one month intermittent  R flank pain positional worse when lie on that side then resolves immediately when rolls to other side.  Not taking citrucel any more at all. No assoc nausea or change in painwith bowel or bladder function. Very similar to previous cp that resolved on citrucel. No pleuritic component. Fasting cbg's running 140-180 no overt symptoms of low or high blood sugar. rec Citrucel 1 tsp twice daily with large glass of water and if not better within two please call See calendar for specific medication instructions    01/24/2012 f/u ov/[name] Little for annual eval for multiple chronic problems (dm/hbp/hyperlipidemia)cc abd pain resolved on citrucel w/in a week or two of restarting citrucel.   No unusual cough, purulent sputum or sinus/hb symptoms on present rx. rec Please see patient coordinator before you leave today  to schedule GI referral for colonoscopy    04/09/2012 f/u ov/[name] Little cc cough since 9/30 "like a cold" with mostly dry cough and some chills and nausea but no decrease in po intake then woke up 730 pm 10/1 (normally wakes at 1030pm to go to work) feeling very chilled couldn't move/ speak x 3-5 min then went back to sleep then woke up at 1030 pm and went to  work - L shoulder felt like a "cold in the chest"(hurt some to cough) not sob -at work had temp of 100.1 and sore throat. No sob, no ha or vomiting or gi/gu symptoms. cxr c/w ? pna rec Doxycline 100 mg twice daily x 10 days Mucinex dm for cough as per your calendar   04/29/2012 f/u ov/[name] Little cc almost completely better, back at work min cough, no sore throat or L shoulder pain. >>no changes   06/10/2012 Follow up    est med calendar - pt brought all meds with him today.  reports breathing is doing well. We reviewed all meds and organized them into a med  calendar , appears to be taking meds correctly Labs last ov showed A1C 10.4 despite glucovance increase .  We discussed diet control. BS average 180-200 at home.  rec Add Januvia 50mg  daily in am when you wake up.  Call if blood sugars >80 on consistent basis .  Low sweet diet.  Follow up Dr. Sherene Sires  In 6 weeks   07/22/2012 f/u ov/[name] Little cc sugars too low after fasting since on 50 mg januvia, did not bring calendar. No tia or claudication, no cp or sob. rec Reduce Januvia  50 mg to one half daily and edit your calendar When you get your new prescription for Januvia = 25 mg take one daily   10/21/2012 f/u ov/[name] Little re hbp, dm, new skin changes Chief Complaint  Patient presents with  . Follow-up    pt wants referrall for knot on his left ear, and "cancer spots" on his arms.  cbg's 120-180 fasting.  No cp, sob, claudication/ tia >no changes   01/27/2013 Follow up and med review  Returns for follow up and med review .  We reviewed all his meds and organized them into a med calendar with pt education. Appears he is taking meds correctly .   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    Needs new glucometer. Not accurate . No polyuria/polydipsia . Last A1C holding at 8.9. We discussed diet. Does band exercises daily for shoulder, walks daily.    Tripped going over fence falling onto left knee 6 weeks ago.  Seen at urgent care with reported neg for fx. No records available. Had a lot of bruising that is resolved. Still has some pain along knee and throbbing in toes at times.  No calf pain or swelling. No redness . No fever.    05/27/2013 f/u ov/[name] Little re: hyperlipidemia/ recurrent cough   Chief Complaint  Patient presents with  . Annual Exam    Pt fasting. Has some concerns about victoza that his endocrinologist prescribed.   worried about thyroid ca "because my brother took the same shots and he got it" so reluctant to follow Dr Remus Blake instructions and  becoming more confused with details of his care, timing of meds with shift work.   Not limited by sob, no chronic cough or cp or chest tightness, subjective wheeze overt sinus or hb symptoms. No unusual exp hx or h/o childhood pna/ asthma or knowledge of premature birth.  Sleeping ok without nocturnal  or early am exacerbation  of respiratory  c/o's or need for noct saba. Also denies any obvious fluctuation of symptoms with weather or environmental changes or other aggravating or alleviating factors except as outlined above   Current Medications, Allergies, Complete Past Medical History, Past Surgical History, Family History, and Social History were reviewed in [employer]  medical record.  ROS  The following are not active complaints unless bolded sore throat, dysphagia, dental problems, itching, sneezing,  nasal congestion or excess/ purulent secretions, ear ache,   fever, chills, sweats, unintended wt loss, pleuritic or exertional cp, hemoptysis,  orthopnea pnd or leg swelling, presyncope, palpitations, heartburn, abdominal pain, anorexia, nausea, vomiting, diarrhea  or change in bowel or urinary habits, change in stools or urine, dysuria,hematuria,  rash, arthralgias, visual complaints, headache, numbness weakness or ataxia or problems with walking or coordination,  change in mood/affect or memory.                       Past Medical History:  ACTINIC KERATOSIS (ICD-702.0)  Referred to [facility] 06/27/2007  PAIN IN JOINT, SITE UNSPECIFIED (ICD-719.40)...............................................[place]  HYPERLIPIDEMIA (ICD-272.4)  - Target LDL < 70 due to DM  OBESITY NOS (ICD-278.00)  - target weight 190, ideal < 171, peak 228 October 21, 2007  - REC: return to nutrition with food diary 07/01/08 > declined referral  DM (ICD-250.00) .............................................................................Marland Kitchen  Kumar A1C 9.0>8.0 01/05/10 Glucovance 2.5mg  /500mg  three  times a day , Actos stopped > 9.2 April 11, 2010  HYPERTENSION (ICD-401.9)  HEALTH MAINTENANCE.....................................................................Marland KitchenWert  - Pneumovax 04/29/2012  - Td 06/27/2007 - Colonoscopy letter sent 09/19/06, reviewed with pt 07/01/08> > referred again October 03, 2009 >did not make appt > re-ordered 01/24/2012 > reminded 07/22/2012 >referred 01/27/2013  - CPX 05/27/2013            Objective:   Physical Exam  Ambulatory healthy appearing obese white male in no acute distress.  wt 227 07/01/08 > 223 May 30, 2009  > 209 10/18/2010 > 10/10/2011  198 > 01/24/2012 199> 04/09/2012  194 > 196 04/29/2012 >192 06/10/12  > 197 07/22/2012 > 10/21/2012  197  >199 01/27/2013  > 180 05/27/2013  HEENT: nl dentition, and orophanx. mod non-specific turbinate edema. Nl external ear canals without cough reflex  Neck without JVD/Nodes/TM. Carotid upstrokes brsk with no bruits.  Lungs diminished BS in bases  RRR no s3 or murmur or increase in P2. No edema  Abd soft and benign with nl excursion in the supine position. No bruits or organomegaly  Ext warm without calf tenderness, cyanosis clubbing Venous insufficiency changes bilaterally LEs with good pulses MS nl gait, no restrictions Skin:   No rash, scattered ak's face and ears GU uncirc, no IH, no nodules Rectal prostate nl, no nodules, stool g neg    CXR  05/27/2013 :  No active cardiopulmonary disease.        Assessment & Plan:

## 2013-05-27 NOTE — Patient Instructions (Signed)
I will ask Dr Lucianne Muss to assume care of all your metabolic issues:  Hypertension, Diabetes/ hyperlipidemia and I will focus on breathing issues going forward  Please remember to go to the lab and x-ray department downstairs for your tests - we will call you with the results when they are available.     Please schedule a follow up visit in 3 months but call sooner if needed

## 2013-05-27 NOTE — Progress Notes (Signed)
Quick Note:  LMTCB ______ 

## 2013-05-27 NOTE — Progress Notes (Signed)
[name] Little, [name] Little

## 2013-06-16 ENCOUNTER — Other Ambulatory Visit (INDEPENDENT_AMBULATORY_CARE_PROVIDER_SITE_OTHER): Payer: 59

## 2013-06-16 ENCOUNTER — Other Ambulatory Visit: Payer: 59

## 2013-06-16 LAB — COMPREHENSIVE METABOLIC PANEL
ALT: 18 U/L (ref 0–53)
AST: 19 U/L (ref 0–37)
Alkaline Phosphatase: 49 U/L (ref 39–117)
Creatinine, Ser: 1 mg/dL (ref 0.4–1.5)
GFR: 81.3 mL/min (ref >60.00)
Potassium: 4.7 mEq/L (ref 3.5–5.1)
Total Bilirubin: 0.7 mg/dL (ref 0.3–1.2)

## 2013-06-17 LAB — FRUCTOSAMINE: Fructosamine: 320 umol/L — ABNORMAL HIGH (ref <285)

## 2013-06-18 ENCOUNTER — Ambulatory Visit (INDEPENDENT_AMBULATORY_CARE_PROVIDER_SITE_OTHER): Payer: 59 | Admitting: Endocrinology

## 2013-06-18 ENCOUNTER — Encounter: Payer: Self-pay | Admitting: Endocrinology

## 2013-06-18 VITALS — BP 116/60 | HR 71 | Temp 98.5°F | Resp 12 | Ht 67.0 in | Wt 179.0 lb

## 2013-06-18 DIAGNOSIS — E785 Hyperlipidemia, unspecified: Secondary | ICD-10-CM

## 2013-06-18 NOTE — Progress Notes (Signed)
Patient ID: [name] Little, male   DOB: [birth year], 62 y.o.   MRN: [id number]  Reason for Appointment : Followup for Type 2 Diabetes  History of Present Illness          Diagnosis: Type 2 diabetes mellitus, date of diagnosis: 1994   Past history: He has been treated for his diabetes with Glucovance for many years  with variable control Also appears to have had minimal diabetes education and has been monitoring his blood sugars only sporadically Review of his A1c shows he has had levels of at least 8% since 2009 and high levels in the last 2 years. On his regimen of glyburide/metformin and low dose Januvia his blood sugars were fluctuating significantly ranging from 80-200+ and also occasional symptoms of hypoglycemia His glyburide was stopped because of significant variability in blood sugars and tendency to hypoglycemia. Also his Januvia was increased from 25 mg daily therapeutic dose of 100 mg.  Recent history:  He has been taking Janumet XR along with Invokana 300 mg.   He is taking his medications consistently with Invokana in the morning before his meal and Janumet XR at night He was asked to start VICTOZA and his last visit but apparently his wife was concerned about the patient's brother having medullary carcinoma of the thyroid and he has not started this However he has started making significant changes in his diet and has lost a considerable amount of weight His blood sugars are also significantly better although he is checking them mostly before breakfast He has been off work for the last 3 weeks Recently his blood sugars are relatively higher before breakfast  Fructosamine indicates relatively good control, the level being 320 previously 389    Glucose monitoring:  about once a day        Glucometer: One Touch.      Blood Glucose readings from download:   PREMEAL Breakfast Lunch Dinner Bedtime Overall  Glucose range:  102-194   137   82-102     Mean/median:  129      129     POST-MEAL PC Breakfast PC Lunch PC Dinner  Glucose range:  131   87-111   Mean/median:      Hypoglycemia frequency:  none recently.           Self-care: The diet that the patient has been following is: tries to limit carbohydrates and portions.      Meals: 3 meals per day. He works third shift and has variable  meal times; main meal at 10 pm; lunch 4 am;  eating out 3-4 times a week  Physical activity: exercise: none  recently       Dietician visit: Most recent: 12 years ago, has not made followup appointment               Oral hypoglycemic drugs the patient is taking are: Janumet XR, Invokana      Side effects from medications have been none except hypoglycemia   Compliance with the medical regimen:  fair Retinal exam: Most recent: 2013.     Wt Readings from Last 3 Encounters:  06/18/13 179 lb (81.194 kg)  05/27/13 180 lb (81.647 kg)  05/19/13 187 lb 3.2 oz (84.913 kg)   Lab Results  Component Value Date   HGBA1C 9.8* 05/14/2013   HGBA1C 9.7* 01/27/2013   HGBA1C 8.9* 10/21/2012   Lab Results  Component Value Date   MICROALBUR 0.1 03/26/2013   LDLCALC 65 05/27/2013   CREATININE  1.0 06/16/2013       Medication List       This list is accurate as of: 06/18/13  1:59 PM.  Always use your most recent med list.               aspirin 81 MG tablet  Take 81 mg by mouth at bedtime.     Canagliflozin 300 MG Tabs  Commonly known as:  INVOKANA  Take 1 tablet (300 mg total) by mouth daily.     dextromethorphan-guaiFENesin 30-600 MG per 12 hr tablet  Commonly known as:  MUCINEX DM  Take 1 tablet by mouth every 12 (twelve) hours as needed.     freestyle lancets  28 GAUGE. USE AS DIRECTED.     ibuprofen 200 MG tablet  Commonly known as:  ADVIL,MOTRIN  3 tabs w/ meals every 8 hours as needed     mometasone 50 MCG/ACT nasal spray  Commonly known as:  NASONEX  2 puffs twice daily     ONE TOUCH ULTRA SYSTEM KIT W/DEVICE Kit  Use to test once daily     oxymetazoline  0.05 % nasal spray  Commonly known as:  AFRIN  2 puffs twice daily as needed for 5 days before nasonex as needed     SIMCOR 500-20 MG 24 hr tablet  Generic drug:  niacin-simvastatin  TAKE 1 TABLET BY MOUTH AT BEDTIME.     SitaGLIPtin-MetFORMIN HCl 50-1000 MG Tb24  Commonly known as:  JANUMET XR  Take 100 mg by mouth daily.        Allergies: No Known Allergies  Past Medical History  Diagnosis Date  . Actinic keratosis   . Other and unspecified hyperlipidemia   . Obesity, unspecified   . Type II or unspecified type diabetes mellitus without mention of complication, not stated as uncontrolled   . Unspecified essential hypertension     Past Surgical History  Procedure Laterality Date  . Rotator cuff repair      Family History  Problem Relation Age of Onset  . COPD Father   . Diabetes Mother   . Cancer Brother   . Thyroid cancer Brother   . Colon cancer Neg Hx     Social History:  reports that he quit smoking about 14 years ago. His smoking use included Cigars. He has never used smokeless tobacco. He reports that he does not drink alcohol. His drug history is not on file.    Review of Systems       Lipids:  he has had diabetic dyslipidemia and taking Simcor low-dose      The blood pressure has been  treated with Benicar, now taking only 10 mg         No history of Numbness, tingling or burning in feet    Appointment on 06/16/2013  Component Date Value Range Status  . Sodium 06/16/2013 135  135 - 145 mEq/L Final  . Potassium 06/16/2013 4.7  3.5 - 5.1 mEq/L Final  . Chloride 06/16/2013 101  96 - 112 mEq/L Final  . CO2 06/16/2013 24  19 - 32 mEq/L Final  . Glucose, Bld 06/16/2013 136* 70 - 99 mg/dL Final  . BUN 16/04/9603 18  6 - 23 mg/dL Final  . Creatinine, Ser 06/16/2013 1.0  0.4 - 1.5 mg/dL Final  . Total Bilirubin 06/16/2013 0.7  0.3 - 1.2 mg/dL Final  . Alkaline Phosphatase 06/16/2013 49  39 - 117 U/L Final  . AST 06/16/2013 19  0 -  37 U/L Final  . ALT  06/16/2013 18  0 - 53 U/L Final  . Total Protein 06/16/2013 7.6  6.0 - 8.3 g/dL Final  . Albumin 16/04/9603 4.5  3.5 - 5.2 g/dL Final  . Calcium 54/03/8118 9.2  8.4 - 10.5 mg/dL Final  . GFR 14/78/2956 81.30  >60.00 mL/min Final  . Fructosamine 06/16/2013 320* <285 umol/L Final   Comment:                            Variations in levels of serum proteins (albumin and immunoglobulins)                          may affect fructosamine results.                               Physical Examination:  BP 116/60  Pulse 71  Temp(Src) 98.5 F (36.9 C)  Resp 12  Ht 5\' 7"  (1.702 m)  Wt 179 lb (81.194 kg)  BMI 28.03 kg/m2  SpO2 97%  No thyroid nodules felt  No pedal edema    ASSESSMENT:  Diabetes type 2, uncontrolled   His blood sugars are significantly improved along with weight loss related to improved diet He has not been checking many readings after meals but recently his fasting readings appear to be higher than in the evening Fructosamine indicates improved control, previously A1C was over 9%   He will continue with Invokana and Janumet for now and may consider adding low dose Amaryl if having consistently high readings at a given time Encouraged him to start walking for exercise which would help with control further To check A1c again on his next visit   [name] Little 06/18/2013, 1:59 PM

## 2013-06-18 NOTE — Patient Instructions (Signed)
Please check blood sugars at least half the time about 2 hours after any meal and as directed on waking up. Please bring blood sugar monitor to each visit  Exercise 20-30 min 3-4x per week

## 2013-07-03 ENCOUNTER — Other Ambulatory Visit: Payer: Self-pay | Admitting: *Deleted

## 2013-07-03 MED ORDER — SITAGLIP PHOS-METFORMIN HCL ER 50-1000 MG PO TB24: ORAL_TABLET | ORAL | Status: DC

## 2013-07-24 ENCOUNTER — Other Ambulatory Visit: Payer: Self-pay | Admitting: *Deleted

## 2013-07-24 DIAGNOSIS — IMO0001 Reserved for inherently not codable concepts without codable children: Secondary | ICD-10-CM

## 2013-07-24 DIAGNOSIS — E1165 Type 2 diabetes mellitus with hyperglycemia: Principal | ICD-10-CM

## 2013-07-24 MED ORDER — CANAGLIFLOZIN 300 MG PO TABS: 300.0000 mg | ORAL_TABLET | Freq: Every day | ORAL | Status: DC

## 2013-08-17 ENCOUNTER — Other Ambulatory Visit: Payer: 59

## 2013-08-21 ENCOUNTER — Ambulatory Visit: Payer: 59 | Admitting: Endocrinology

## 2013-09-07 ENCOUNTER — Other Ambulatory Visit (INDEPENDENT_AMBULATORY_CARE_PROVIDER_SITE_OTHER): Payer: 59

## 2013-09-07 DIAGNOSIS — IMO0001 Reserved for inherently not codable concepts without codable children: Secondary | ICD-10-CM

## 2013-09-07 DIAGNOSIS — E1165 Type 2 diabetes mellitus with hyperglycemia: Principal | ICD-10-CM

## 2013-09-07 LAB — BASIC METABOLIC PANEL
BUN: 24 mg/dL — ABNORMAL HIGH (ref 6–23)
CALCIUM: 9 mg/dL (ref 8.4–10.5)
CHLORIDE: 101 meq/L (ref 96–112)
CO2: 25 mEq/L (ref 19–32)
CREATININE: 1 mg/dL (ref 0.4–1.5)
GFR: 79.39 mL/min (ref >60.00)
Glucose, Bld: 131 mg/dL — ABNORMAL HIGH (ref 70–99)
Potassium: 4 mEq/L (ref 3.5–5.1)
Sodium: 135 mEq/L (ref 135–145)

## 2013-09-07 LAB — HEMOGLOBIN A1C: HEMOGLOBIN A1C: 8.5 % — AB (ref 4.6–6.5)

## 2013-09-09 ENCOUNTER — Encounter: Payer: Self-pay | Admitting: Endocrinology

## 2013-09-09 ENCOUNTER — Ambulatory Visit (INDEPENDENT_AMBULATORY_CARE_PROVIDER_SITE_OTHER): Payer: 59 | Admitting: Endocrinology

## 2013-09-09 VITALS — BP 124/66 | HR 78 | Temp 98.2°F | Resp 14 | Ht 68.0 in | Wt 177.2 lb

## 2013-09-09 DIAGNOSIS — IMO0001 Reserved for inherently not codable concepts without codable children: Secondary | ICD-10-CM

## 2013-09-09 DIAGNOSIS — E1165 Type 2 diabetes mellitus with hyperglycemia: Principal | ICD-10-CM

## 2013-09-09 DIAGNOSIS — E785 Hyperlipidemia, unspecified: Secondary | ICD-10-CM

## 2013-09-09 MED ORDER — GLIMEPIRIDE 1 MG PO TABS: 1.0000 mg | ORAL_TABLET | Freq: Every day | ORAL | Status: DC

## 2013-09-09 NOTE — Patient Instructions (Signed)
Glimeperide 1 mg at breakfast  Please check blood sugars at least half the time about 2 hours after any meal and as directed on waking up. Please bring blood sugar monitor to each visit

## 2013-09-09 NOTE — Progress Notes (Signed)
Patient ID: [name] Little, male   DOB: 07-29-1950, 63 y.o.   MRN: [id number]   Reason for Appointment : Followup for Type 2 Diabetes  History of Present Illness          Diagnosis: Type 2 diabetes mellitus, date of diagnosis: 1994   Past history: He has been treated for his diabetes with Glucovance for many years  with variable control Also appears to have had minimal diabetes education and has been monitoring his blood sugars only sporadically Review of his A1c shows he has had levels of at least 8% since 2009 and high levels in the last 2 years. On his regimen of glyburide/metformin and low dose Januvia his blood sugars were fluctuating significantly ranging from 80-200+ and also occasional symptoms of hypoglycemia His glyburide was stopped because of significant variability in blood sugars and tendency to hypoglycemia. Also his Januvia was increased from 25 mg daily therapeutic dose of 100 mg.  Recent history:  He has been taking Janumet XR maximum dose along with Invokana 300 mg. before his first meal of the day  He had been reluctant to use VICTOZA because he was concerned about his brother having medullary carcinoma of the thyroid  Although he has been trying to work on his diet and weight is slightly better his A1c is still over 8% He is working 6 days a week and usually sleeping during the day. Checking blood sugars mostly on waking up in about 4 hours after his lunch    Glucose monitoring:  1.3 times daily        Glucometer: One Touch.      Blood Glucose readings from download:   PREMEAL  fasting  Lunch Dinner Bedtime Overall  Glucose range:  73-154  ?   133-173   134-161    Mean/median:  106    152   157   122    Hypoglycemia frequency:  none recently.           Self-care: The diet that the patient has been following is: tries to limit carbohydrates and portions.      Meals: 3 meals per day. He works third shift and has variable  meal times; main meal at 10 pm before work;  lunch 4 am;  eating out 3-4 times a week  Physical activity: exercise: none        Dietician visit: Most recent: 12 years ago, has not made followup appointment               Oral hypoglycemic drugs the patient is taking are: Janumet XR, Invokana      Side effects from medications have been none except hypoglycemia   Compliance with the medical regimen:  fair Retinal exam: Most recent: 2013.     Wt Readings from Last 3 Encounters:  09/09/13 177 lb 3.2 oz (80.377 kg)  06/18/13 179 lb (81.194 kg)  05/27/13 180 lb (81.647 kg)   Lab Results  Component Value Date   HGBA1C 8.5* 09/07/2013   HGBA1C 9.8* 05/14/2013   HGBA1C 9.7* 01/27/2013   Lab Results  Component Value Date   MICROALBUR 0.1 03/26/2013   LDLCALC 65 05/27/2013   CREATININE 1.0 09/07/2013       Medication List       This list is accurate as of: 09/09/13  8:31 AM.  Always use your most recent med list.               aspirin 81 MG tablet  Take 81 mg by mouth at bedtime.     Canagliflozin 300 MG Tabs  Commonly known as:  INVOKANA  Take 1 tablet (300 mg total) by mouth daily.     dextromethorphan-guaiFENesin 30-600 MG per 12 hr tablet  Commonly known as:  MUCINEX DM  Take 1 tablet by mouth every 12 (twelve) hours as needed.     freestyle lancets  28 GAUGE. USE AS DIRECTED.     ibuprofen 200 MG tablet  Commonly known as:  ADVIL,MOTRIN  3 tabs w/ meals every 8 hours as needed     mometasone 50 MCG/ACT nasal spray  Commonly known as:  NASONEX  2 puffs twice daily     ONE TOUCH ULTRA SYSTEM KIT W/DEVICE Kit  Use to test once daily     oxymetazoline 0.05 % nasal spray  Commonly known as:  AFRIN  2 puffs twice daily as needed for 5 days before nasonex as needed     SIMCOR 500-20 MG 24 hr tablet  Generic drug:  niacin-simvastatin  TAKE 1 TABLET BY MOUTH AT BEDTIME.     SitaGLIPtin-MetFORMIN HCl 50-1000 MG Tb24  Commonly known as:  JANUMET XR  Take 2 tablets once daily        Allergies: No Known  Allergies  Past Medical History  Diagnosis Date  . Actinic keratosis   . Other and unspecified hyperlipidemia   . Obesity, unspecified   . Type II or unspecified type diabetes mellitus without mention of complication, not stated as uncontrolled   . Unspecified essential hypertension     Past Surgical History  Procedure Laterality Date  . Rotator cuff repair      Family History  Problem Relation Age of Onset  . COPD Father   . Diabetes Mother   . Cancer Brother   . Thyroid cancer Brother   . Colon cancer Neg Hx     Social History:  reports that he quit smoking about 15 years ago. His smoking use included Cigars. He has never used smokeless tobacco. He reports that he does not drink alcohol. His drug history is not on file.    Review of Systems       Lipids:  he has had diabetic dyslipidemia and taking Simcor low-dose      The blood pressure has been  treated with Benicar, now taking only 10 mg         No history of Numbness, tingling or burning in feet           He is asking about taking the thyroid, has a brother with a history of medullary thyroid cancer but no other familial cancers   Physical Examination:  BP 124/66  Pulse 78  Temp(Src) 98.2 F (36.8 C)  Resp 14  Ht '5\' 8"'  (1.727 m)  Wt 177 lb 3.2 oz (80.377 kg)  BMI 26.95 kg/m2  SpO2 97%     ASSESSMENT:  Diabetes type 2, uncontrolled   His blood sugars are somewhat improved along with weight loss related to improved diet However his A1c is high again and probably reflects postprandial hyperglycemia after meals during the day He will continue with Invokana and Janumet for now and will add 1 mg  dose Amaryl with his breakfast He will start checking readings about 2 hours after his main meal at 10 PM and will adjust his medication as needed  Encouraged him to start walking for exercise which would help with control further   Nimrit Kehres 09/09/2013, 8:31 AM

## 2013-09-15 ENCOUNTER — Telehealth: Payer: Self-pay | Admitting: Endocrinology

## 2013-09-15 NOTE — Telephone Encounter (Signed)
Pt would like to speak with nurse regarding his medication   Please call pt   Call [phone]  Thank you:)

## 2013-10-21 ENCOUNTER — Other Ambulatory Visit (INDEPENDENT_AMBULATORY_CARE_PROVIDER_SITE_OTHER): Payer: 59

## 2013-10-21 DIAGNOSIS — E1165 Type 2 diabetes mellitus with hyperglycemia: Principal | ICD-10-CM

## 2013-10-21 DIAGNOSIS — IMO0001 Reserved for inherently not codable concepts without codable children: Secondary | ICD-10-CM

## 2013-10-21 LAB — GLUCOSE, RANDOM: GLUCOSE: 197 mg/dL — AB (ref 70–99)

## 2013-10-23 ENCOUNTER — Encounter: Payer: Self-pay | Admitting: Endocrinology

## 2013-10-23 ENCOUNTER — Ambulatory Visit (INDEPENDENT_AMBULATORY_CARE_PROVIDER_SITE_OTHER): Payer: 59 | Admitting: Endocrinology

## 2013-10-23 VITALS — BP 112/64 | HR 80 | Temp 98.1°F | Resp 14 | Ht 68.0 in | Wt 178.4 lb

## 2013-10-23 DIAGNOSIS — I10 Essential (primary) hypertension: Secondary | ICD-10-CM

## 2013-10-23 DIAGNOSIS — IMO0001 Reserved for inherently not codable concepts without codable children: Secondary | ICD-10-CM

## 2013-10-23 DIAGNOSIS — E1165 Type 2 diabetes mellitus with hyperglycemia: Principal | ICD-10-CM

## 2013-10-23 DIAGNOSIS — E785 Hyperlipidemia, unspecified: Secondary | ICD-10-CM

## 2013-10-23 LAB — FRUCTOSAMINE: Fructosamine: 315 umol/L — ABNORMAL HIGH (ref 190–270)

## 2013-10-23 NOTE — Progress Notes (Signed)
Patient ID: [name] Little, male   DOB: Jul 18, 1950, 63 y.o.   MRN: [id number]   Reason for Appointment : Followup for Type 2 Diabetes  History of Present Illness          Diagnosis: Type 2 diabetes mellitus, date of diagnosis: 1994   Past history: He has been treated for his diabetes with Glucovance for many years  with variable control Also appears to have had minimal diabetes education and has been monitoring his blood sugars only sporadically Review of his A1c shows he has had levels of at least 8% since 2009 and high levels in the last 2 years. On his regimen of glyburide/metformin and low dose Januvia his blood sugars were fluctuating significantly ranging from 80-200+ and also occasional symptoms of hypoglycemia His glyburide was stopped because of significant variability in blood sugars and tendency to hypoglycemia. Also his Januvia was increased from 25 mg daily therapeutic dose of 100 mg.  Recent history:  Because of high A1c and some postprandial high readings he was started on low-dose Amaryl on his last visit in 3/15 He is taking his Amaryl around noontime before he goes to sleep and has had a couple of low normal readings on waking up especially when he is  eating salads at his noon meal His lab glucose was higher when he came in 2 hours after his morning meal although he normally balanced meal at bedtime He thinks his sugars are high only when he has been sent from the grocery store  He has been taking Janumet XR maximum dose along with Invokana 300 mg. before his first meal of the day  He had been reluctant to use VICTOZA because he was concerned about his brother having medullary carcinoma of the thyroid  Although he has been trying to work on his diet and weight is slightly better his A1c is still over 8% He is working 6 days a week and usually sleeping during the day. Checking blood sugars mostly on waking up in about 4 hours after his lunch    Glucose monitoring:  1.3 times  daily        Glucometer: One Touch.      Blood Glucose readings from download:   PREMEAL  fasting   12 AM   7-8 AM   2-5 PM  Overall  Glucose range:  62-118   88-181   86-144   81-170    Mean/median:  76   112   114   118   102     Hypoglycemia frequency:  3 times in the last 6 months with blood sugars in the 60s, he wakes up with a feeling of knot in Stomach           Self-care: The diet that the patient has been following is: tries to limit carbohydrates and portions.      Meals: 3 meals per day. He works third shift and has variable  meal times; main meal at 10 pm before work; lunch 4 am;  eating out 3-4 times a week  Physical activity: exercise: active at work       Google visit: Most recent: 12 years ago, has not made followup appointment               Oral hypoglycemic drugs the patient is taking are: Janumet XR, Invokana, Amaryl      Side effects from medications have been none except hypoglycemia   Compliance with the medical regimen:  fair Retinal  exam: Most recent: 2013.     Wt Readings from Last 3 Encounters:  10/23/13 178 lb 6.4 oz (80.922 kg)  09/09/13 177 lb 3.2 oz (80.377 kg)  06/18/13 179 lb (81.194 kg)   Lab Results  Component Value Date   HGBA1C 8.5* 09/07/2013   HGBA1C 9.8* 05/14/2013   HGBA1C 9.7* 01/27/2013   Lab Results  Component Value Date   MICROALBUR 0.1 03/26/2013   LDLCALC 65 05/27/2013   CREATININE 1.0 09/07/2013       Medication List       This list is accurate as of: 10/23/13  8:28 AM.  Always use your most recent med list.               aspirin 81 MG tablet  Take 81 mg by mouth at bedtime.     Canagliflozin 300 MG Tabs  Commonly known as:  INVOKANA  Take 1 tablet (300 mg total) by mouth daily.     dextromethorphan-guaiFENesin 30-600 MG per 12 hr tablet  Commonly known as:  MUCINEX DM  Take 1 tablet by mouth every 12 (twelve) hours as needed.     freestyle lancets  28 GAUGE. USE AS DIRECTED.     glimepiride 1 MG tablet   Commonly known as:  AMARYL  Take 1 tablet (1 mg total) by mouth daily with breakfast.     ibuprofen 200 MG tablet  Commonly known as:  ADVIL,MOTRIN  3 tabs w/ meals every 8 hours as needed     mometasone 50 MCG/ACT nasal spray  Commonly known as:  NASONEX  2 puffs twice daily     ONE TOUCH ULTRA SYSTEM KIT W/DEVICE Kit  Use to test once daily     oxymetazoline 0.05 % nasal spray  Commonly known as:  AFRIN  2 puffs twice daily as needed for 5 days before nasonex as needed     SIMCOR 500-20 MG 24 hr tablet  Generic drug:  niacin-simvastatin  TAKE 1 TABLET BY MOUTH AT BEDTIME.     SitaGLIPtin-MetFORMIN HCl 50-1000 MG Tb24  Commonly known as:  JANUMET XR  Take 2 tablets once daily        Allergies: No Known Allergies  Past Medical History  Diagnosis Date  . Actinic keratosis   . Other and unspecified hyperlipidemia   . Obesity, unspecified   . Type II or unspecified type diabetes mellitus without mention of complication, not stated as uncontrolled   . Unspecified essential hypertension     Past Surgical History  Procedure Laterality Date  . Rotator cuff repair      Family History  Problem Relation Age of Onset  . COPD Father   . Diabetes Mother   . Cancer Brother   . Thyroid cancer Brother   . Colon cancer Neg Hx     Social History:  reports that he quit smoking about 15 years ago. His smoking use included Cigars. He has never used smokeless tobacco. He reports that he does not drink alcohol. His drug history is not on file.    Review of Systems       Lipids:  he has had diabetic dyslipidemia and taking Simcor low-dose      The blood pressure has been  treated with Benicar, now taking only 10 mg         Physical Examination:  BP 112/64  Pulse 80  Temp(Src) 98.1 F (36.7 C)  Resp 14  Ht _0  (1.727 m)  Wt 178  lb 6.4 oz (80.922 kg)  BMI 27.13 kg/m2  SpO2 96%  No edema    ASSESSMENT:  Diabetes type 2, uncontrolled   His blood sugars are  significantly improved with adding 1 mg Amaryl along with improved diet This  was done because of his persistently high A1c of 8.5 He is still on Invokana and Janumet XR He has done a few readings after meals but mostly checking before breakfast and some after his lunch at work With taking his Amaryl and around midday he is waking up with low sugars in the evening before he goes to work Will have him take the Amaryl with his lunch early morning at work and only use 0.5 mg instead He will continue with Invokana and Janumet for now and encourage him to stay active He will be retiring in a few months and will be able to exercise more consistently Will need followup A1c on his next visit   Elayne Snare 10/23/2013, 8:28 AM

## 2013-10-23 NOTE — Patient Instructions (Addendum)
Glimeperide 1/2 tab with lunch at 6 am  More sugars 2 hrs after meals

## 2013-11-25 ENCOUNTER — Other Ambulatory Visit: Payer: Self-pay | Admitting: *Deleted

## 2013-11-25 DIAGNOSIS — IMO0001 Reserved for inherently not codable concepts without codable children: Secondary | ICD-10-CM

## 2013-11-25 DIAGNOSIS — E1165 Type 2 diabetes mellitus with hyperglycemia: Principal | ICD-10-CM

## 2013-11-25 MED ORDER — CANAGLIFLOZIN 300 MG PO TABS: 300.0000 mg | ORAL_TABLET | Freq: Every day | ORAL | Status: DC

## 2013-11-27 ENCOUNTER — Other Ambulatory Visit: Payer: Self-pay | Admitting: Internal Medicine

## 2013-12-22 ENCOUNTER — Other Ambulatory Visit: Payer: 59

## 2013-12-25 ENCOUNTER — Ambulatory Visit: Payer: 59 | Admitting: Endocrinology

## 2013-12-29 ENCOUNTER — Other Ambulatory Visit: Payer: Self-pay | Admitting: Internal Medicine

## 2014-01-07 ENCOUNTER — Other Ambulatory Visit (INDEPENDENT_AMBULATORY_CARE_PROVIDER_SITE_OTHER): Payer: 59

## 2014-01-07 DIAGNOSIS — E1165 Type 2 diabetes mellitus with hyperglycemia: Principal | ICD-10-CM

## 2014-01-07 DIAGNOSIS — IMO0001 Reserved for inherently not codable concepts without codable children: Secondary | ICD-10-CM

## 2014-01-07 LAB — COMPREHENSIVE METABOLIC PANEL
ALT: 24 U/L (ref 0–53)
AST: 18 U/L (ref 0–37)
Albumin: 4.2 g/dL (ref 3.5–5.2)
Alkaline Phosphatase: 53 U/L (ref 39–117)
BUN: 20 mg/dL (ref 6–23)
CO2: 27 mEq/L (ref 19–32)
Calcium: 9.4 mg/dL (ref 8.4–10.5)
Chloride: 104 mEq/L (ref 96–112)
Creatinine, Ser: 0.9 mg/dL (ref 0.4–1.5)
GFR: 88.32 mL/min (ref >60.00)
Glucose, Bld: 146 mg/dL — ABNORMAL HIGH (ref 70–99)
Potassium: 4 mEq/L (ref 3.5–5.1)
Sodium: 137 mEq/L (ref 135–145)
Total Bilirubin: 0.4 mg/dL (ref 0.2–1.2)
Total Protein: 7.6 g/dL (ref 6.0–8.3)

## 2014-01-07 LAB — HEMOGLOBIN A1C: Hgb A1c MFr Bld: 7.9 % — ABNORMAL HIGH (ref 4.6–6.5)

## 2014-01-12 ENCOUNTER — Ambulatory Visit (INDEPENDENT_AMBULATORY_CARE_PROVIDER_SITE_OTHER): Payer: 59 | Admitting: Endocrinology

## 2014-01-12 ENCOUNTER — Encounter: Payer: Self-pay | Admitting: Endocrinology

## 2014-01-12 VITALS — BP 124/70 | HR 68 | Temp 98.0°F | Resp 14 | Ht 68.0 in | Wt 177.0 lb

## 2014-01-12 DIAGNOSIS — IMO0001 Reserved for inherently not codable concepts without codable children: Secondary | ICD-10-CM

## 2014-01-12 DIAGNOSIS — E1165 Type 2 diabetes mellitus with hyperglycemia: Principal | ICD-10-CM

## 2014-01-12 DIAGNOSIS — E785 Hyperlipidemia, unspecified: Secondary | ICD-10-CM

## 2014-01-12 NOTE — Progress Notes (Signed)
Patient ID: [name] Little, male   DOB: 03-19-1951, 63 y.o.   MRN: [id number]   Reason for Appointment : Followup for Type 2 Diabetes   History of Present Illness          Diagnosis: Type 2 diabetes mellitus, date of diagnosis: 1994   Past history: He has been treated for his diabetes with Glucovance for many years  with variable control Also appears to have had minimal diabetes education and has been monitoring his blood sugars only sporadically Review of his A1c shows he has had levels of at least 8% since 2009 and high levels in the last 2 years. On his regimen of glyburide/metformin and low dose Januvia his blood sugars were fluctuating significantly ranging from 80-200+ and also occasional symptoms of hypoglycemia His glyburide was stopped because of significant variability in blood sugars and tendency to hypoglycemia.  Also his Januvia was increased from 25 mg daily therapeutic dose of 100 mg. He was started on low-dose Amaryl on his  visit in 3/15  Recent history:  His blood sugar again at home appear to be better than expected for his A1c although his A1c has improved slightly He is not able to remember checking blood sugars 2 hours after his meals and is checking them mostly before his meal when he goes to work and sometimes when he comes back; fasting readings also appear to be fairly good He is taking his Amaryl around noontime before he goes to sleep and does not have as much hypoglycemia with this He also has been taking Janumet XR maximum dose along with Invokana 300 mg. before his first meal of the day  Overall blood sugars are still fairly good with only sporadic readings over 140 No recent hypoglycemic symptoms A1c is slowly improving also  He is working 6 days a week and usually sleeping during the day. Is planning to retire soon    Glucose monitoring:  0.7 times daily        Glucometer: One Touch.      Blood Glucose readings from download:   PREMEAL  fasting   9-10 AM    8 AM  Bedtime Overall  Glucose range:  70-111   117-179  110-139    70-179   Mean/median:  93   143   115    112    Hypoglycemia frequency: None recently          Self-care: The diet that the patient has been following is: tries to limit carbohydrates and portions.      Meals: 3 meals per day. He works third shift and has variable  meal times; main meal at 10 pm before work; lunch 4 am;  eating out 3-4 times a week  Physical activity: exercise: active at work       Google visit: Most recent: 12 years ago, has not made followup appointment               Oral hypoglycemic drugs the patient is taking are: Janumet XR, Invokana, Amaryl      Side effects from medications have been none except hypoglycemia   Compliance with the medical regimen:  fair Retinal exam: Most recent: 2013.     Wt Readings from Last 3 Encounters:  01/12/14 177 lb (80.287 kg)  10/23/13 178 lb 6.4 oz (80.922 kg)  09/09/13 177 lb 3.2 oz (80.377 kg)   Lab Results  Component Value Date   HGBA1C 7.9* 01/07/2014   HGBA1C 8.5* 09/07/2013  HGBA1C 9.8* 05/14/2013   Lab Results  Component Value Date   MICROALBUR 0.1 03/26/2013   LDLCALC 65 05/27/2013   CREATININE 0.9 01/07/2014       Medication List       This list is accurate as of: 01/12/14 10:19 AM.  Always use your most recent med list.               aspirin 81 MG tablet  Take 81 mg by mouth at bedtime.     Canagliflozin 300 MG Tabs  Commonly known as:  INVOKANA  Take 1 tablet (300 mg total) by mouth daily.     dextromethorphan-guaiFENesin 30-600 MG per 12 hr tablet  Commonly known as:  MUCINEX DM  Take 1 tablet by mouth every 12 (twelve) hours as needed.     freestyle lancets  28 GAUGE. USE AS DIRECTED.     glimepiride 1 MG tablet  Commonly known as:  AMARYL  Take 1 tablet (1 mg total) by mouth daily with breakfast.     ibuprofen 200 MG tablet  Commonly known as:  ADVIL,MOTRIN  3 tabs w/ meals every 8 hours as needed     mometasone 50 MCG/ACT  nasal spray  Commonly known as:  NASONEX  2 puffs twice daily     ONE TOUCH ULTRA SYSTEM KIT W/DEVICE Kit  Use to test once daily     ONE TOUCH ULTRA TEST test strip  Generic drug:  glucose blood     oxymetazoline 0.05 % nasal spray  Commonly known as:  AFRIN  2 puffs twice daily as needed for 5 days before nasonex as needed     SIMCOR 500-20 MG 24 hr tablet  Generic drug:  niacin-simvastatin  TAKE 1 TABLET BY MOUTH AT BEDTIME.     SitaGLIPtin-MetFORMIN HCl 50-1000 MG Tb24  Commonly known as:  JANUMET XR  Take 2 tablets once daily        Allergies: No Known Allergies  Past Medical History  Diagnosis Date  . Actinic keratosis   . Other and unspecified hyperlipidemia   . Obesity, unspecified   . Type II or unspecified type diabetes mellitus without mention of complication, not stated as uncontrolled   . Unspecified essential hypertension     Past Surgical History  Procedure Laterality Date  . Rotator cuff repair      Family History  Problem Relation Age of Onset  . COPD Father   . Diabetes Mother   . Cancer Brother   . Thyroid cancer Brother   . Colon cancer Neg Hx     Social History:  reports that he quit smoking about 15 years ago. His smoking use included Cigars. He has never used smokeless tobacco. He reports that he does not drink alcohol. His drug history is not on file.    Review of Systems       Lipids:  he has had diabetic dyslipidemia and taking Simcor low-dose   Lab Results  Component Value Date   CHOL 121 05/27/2013   HDL 36.60* 05/27/2013   LDLCALC 65 05/27/2013   TRIG 98.0 05/27/2013   CHOLHDL 3 05/27/2013    The blood pressure has been  treated with Benicar, now taking only 10 mg         Physical Examination:  BP 124/70  Pulse 68  Temp(Src) 98 F (36.7 C)  Resp 14  Ht '5\' 8"'  (1.727 m)  Wt 177 lb (80.287 kg)  BMI 26.92 kg/m2  SpO2 97%  ASSESSMENT:  Diabetes type 2, uncontrolled   His blood sugars are fairly consistent  with using 1 mg Amaryl along Invokana and Janumet XR Currently not hypoglycemic with taking Amaryl at his lunchtime as before and A1c is also improving Previously had been reluctant to take Victoza because of family history of medullary carcinoma of thyroid He will be retiring in a few weeks and will be able to exercise more consistently Discussed needing to check more readings after meals Will need followup A1c on his next visit   Manda Holstad 01/12/2014, 10:19 AM

## 2014-01-13 NOTE — Patient Instructions (Signed)
Please check blood sugars at least half the time about 2 hours after any meal and 3 times a week  on waking up. Please bring blood sugar monitor to each visit

## 2014-02-26 ENCOUNTER — Other Ambulatory Visit: Payer: Self-pay | Admitting: Endocrinology

## 2014-02-26 DIAGNOSIS — IMO0001 Reserved for inherently not codable concepts without codable children: Secondary | ICD-10-CM

## 2014-02-26 DIAGNOSIS — E1165 Type 2 diabetes mellitus with hyperglycemia: Principal | ICD-10-CM

## 2014-02-26 MED ORDER — SITAGLIP PHOS-METFORMIN HCL ER 50-1000 MG PO TB24: ORAL_TABLET | ORAL | Status: DC

## 2014-03-09 ENCOUNTER — Other Ambulatory Visit: Payer: Self-pay | Admitting: Internal Medicine

## 2014-04-07 ENCOUNTER — Other Ambulatory Visit: Payer: 59

## 2014-04-07 ENCOUNTER — Other Ambulatory Visit: Payer: Self-pay | Admitting: Endocrinology

## 2014-04-07 LAB — LIPID PANEL
CHOL/HDL RATIO: 2.8 ratio
CHOLESTEROL: 136 mg/dL (ref 0–200)
HDL: 48 mg/dL (ref >39)
LDL CALC: 76 mg/dL (ref 0–99)
TRIGLYCERIDES: 59 mg/dL (ref <150)
VLDL: 12 mg/dL (ref 0–40)

## 2014-04-07 LAB — BASIC METABOLIC PANEL
BUN: 23 mg/dL (ref 6–23)
CHLORIDE: 105 meq/L (ref 96–112)
CO2: 19 mEq/L (ref 19–32)
Calcium: 9.3 mg/dL (ref 8.4–10.5)
Creat: 0.78 mg/dL (ref 0.50–1.35)
GLUCOSE: 135 mg/dL — AB (ref 70–99)
Potassium: 4 mEq/L (ref 3.5–5.3)
SODIUM: 141 meq/L (ref 135–145)

## 2014-04-07 LAB — HEMOGLOBIN A1C
Hgb A1c MFr Bld: 8.2 % — ABNORMAL HIGH (ref <5.7)
Mean Plasma Glucose: 189 mg/dL — ABNORMAL HIGH (ref <117)

## 2014-04-08 LAB — MICROALBUMIN, URINE: MICROALB UR: 0.3 mg/dL (ref <2.0)

## 2014-04-11 ENCOUNTER — Other Ambulatory Visit: Payer: Self-pay | Admitting: Pulmonary Disease

## 2014-04-12 ENCOUNTER — Other Ambulatory Visit: Payer: Self-pay | Admitting: Internal Medicine

## 2014-04-12 ENCOUNTER — Other Ambulatory Visit: Payer: Self-pay | Admitting: Pulmonary Disease

## 2014-04-14 ENCOUNTER — Ambulatory Visit (INDEPENDENT_AMBULATORY_CARE_PROVIDER_SITE_OTHER): Payer: 59 | Admitting: Endocrinology

## 2014-04-14 ENCOUNTER — Encounter: Payer: Self-pay | Admitting: Endocrinology

## 2014-04-14 VITALS — BP 124/70 | HR 79 | Temp 98.2°F | Resp 14 | Ht 68.0 in | Wt 172.2 lb

## 2014-04-14 DIAGNOSIS — Z23 Encounter for immunization: Secondary | ICD-10-CM

## 2014-04-14 DIAGNOSIS — IMO0002 Reserved for concepts with insufficient information to code with codable children: Secondary | ICD-10-CM

## 2014-04-14 DIAGNOSIS — E785 Hyperlipidemia, unspecified: Secondary | ICD-10-CM

## 2014-04-14 DIAGNOSIS — E1165 Type 2 diabetes mellitus with hyperglycemia: Secondary | ICD-10-CM

## 2014-04-14 NOTE — Patient Instructions (Addendum)
Take Invokana and Glimeperide before Bfst daily  Please check blood sugars at least half the time about 2 hours after any meal and 3 times per week on waking up. Please bring blood sugar monitor to each visit

## 2014-04-14 NOTE — Progress Notes (Signed)
Patient ID: [name] Little, male   DOB: 1951/06/08, 63 y.o.   MRN: [id number]   Reason for Appointment : Followup for Type 2 Diabetes   History of Present Illness          Diagnosis: Type 2 diabetes mellitus, date of diagnosis: 1994   Past history: He has been treated for his diabetes with Glucovance for many years  with variable control Also appears to have had minimal diabetes education and has been monitoring his blood sugars only sporadically Review of his A1c shows he has had levels of at least 8% since 2009 and high levels in the last 2 years. On his regimen of glyburide/metformin and low dose Januvia his blood sugars were fluctuating significantly ranging from 80-200+ and also occasional symptoms of hypoglycemia His glyburide was stopped because of significant variability in blood sugars and tendency to hypoglycemia.  Also his Januvia was increased from 25 mg daily therapeutic dose of 100 mg. He was started on low-dose Amaryl on his  visit in 3/15  Recent history:  His blood sugar again at home are significantly. 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      Blood Glucose readings from download show fasting readings ranging from 62-150 with median 115:   Hypoglycemia frequency: Rare, once woke up with a glucose of 62       Self-care: The diet that the patient has been following is: tries to limit carbohydrates and portions.      Meals: 3 meals per day.   Physical activity: exercise: None      Dietician visit: Most recent: 12 years ago, has not made followup appointment               Oral hypoglycemic drugs the patient is taking are: Janumet XR, Invokana, Amaryl      Side effects from medications have been none  Compliance with the medical regimen:  fair Retinal exam: Most recent: 2013.     Wt Readings from Last 3 Encounters:  04/14/14 172 lb 3.2 oz (78.109 kg)  01/12/14 177 lb (80.287 kg)  10/23/13 178 lb 6.4 oz (80.922 kg)   Lab Results  Component Value Date   HGBA1C 8.2* 04/07/2014   HGBA1C 7.9* 01/07/2014   HGBA1C 8.5* 09/07/2013   Lab Results  Component Value Date   MICROALBUR 0.3 04/07/2014   [place] 76 04/07/2014   CREATININE 0.78 04/07/2014       Medication List       This list is accurate as of: 04/14/14  8:51 AM.  Always use your most recent med list.               aspirin 81 MG tablet  Take 81 mg by mouth at bedtime.     dextromethorphan-guaiFENesin 30-600 MG per 12 hr tablet  Commonly known as:  MUCINEX DM  Take 1 tablet by mouth every 12 (twelve) hours  as needed.     freestyle lancets  28 GAUGE. USE AS DIRECTED.     glimepiride 1 MG tablet  Commonly known as:  AMARYL  TAKE 1 TABLET (1 MG TOTAL) BY MOUTH DAILY WITH BREAKFAST.     ibuprofen 200 MG tablet  Commonly known as:  ADVIL,MOTRIN  3 tabs w/ meals every 8 hours as needed     INVOKANA 300 MG Tabs  Generic drug:  Canagliflozin  TAKE 1 TABLET BY MOUTH EVERY DAY     mometasone 50 MCG/ACT nasal spray  Commonly known as:  NASONEX  2 puffs twice daily     ONE TOUCH ULTRA SYSTEM KIT W/DEVICE Kit  Use to test once daily     ONE TOUCH ULTRA TEST test strip  Generic drug:  glucose blood     oxymetazoline 0.05 % nasal spray  Commonly known as:  AFRIN  2 puffs twice daily as needed for 5 days before nasonex as needed     SIMCOR 500-20 MG 24 hr tablet  Generic drug:  niacin-simvastatin  TAKE 1 TABLET BY MOUTH AT BEDTIME.      SitaGLIPtin-MetFORMIN HCl 50-1000 MG Tb24  Commonly known as:  JANUMET XR  Take 2 tablets once daily        Allergies: No Known Allergies  Past Medical History  Diagnosis Date  . Actinic keratosis   . Other and unspecified hyperlipidemia   . Obesity, unspecified   . Type II or unspecified type diabetes mellitus without mention of complication, not stated as uncontrolled   . Unspecified essential hypertension     Past Surgical History  Procedure Laterality Date  . Rotator cuff repair      Family History  Problem Relation Age of Onset  . COPD Father   . Diabetes Mother   . Cancer Brother   . Thyroid cancer Brother   . Colon cancer Neg Hx     Social History:  reports that he quit smoking about 15 years ago. His smoking use included Cigars. He has never used smokeless tobacco. He reports that he does not drink alcohol. His drug history is not on file.    Review of Systems       Lipids:  he has had diabetic dyslipidemia and taking Simcor low-dose for quite some time with adequate response  Lab Results  Component Value Date   CHOL 136 04/07/2014   HDL 48 04/07/2014   LDLCALC 76 04/07/2014   TRIG 59 04/07/2014   CHOLHDL 2.8 04/07/2014    The blood pressure has been  well controlled with Benicar, now taking only 10 mg         Physical Examination:  BP 124/70  Pulse 79  Temp(Src) 98.2 F (36.8 C)  Resp 14  Ht '5\' 8"'  (1.727 m)  Wt 172 lb 3.2 oz (78.109 kg)  BMI 26.19 kg/m2  SpO2 97%  No ankle edema   ASSESSMENT:  Diabetes type 2 without obesity  His blood sugars are fairly consistent in the morning and averaging about 115 but his A1c is surprisingly high at 8.2 Not clear if he has postprandial hyperglycemia as he has not checked his readings after meals at all Discussed need for taking at least half of his readings after meals He continues to be using 1 mg Amaryl along Invokana and Janumet XR Previously had fairly good blood sugars at various times of the  day Also recommended starting regular walking program which he is not doing Will switch his  Invokana to before breakfast to see if it will help his postprandial readings better during the day   [facility] 04/14/2014, 8:51 AM

## 2014-04-20 ENCOUNTER — Other Ambulatory Visit: Payer: Self-pay | Admitting: *Deleted

## 2014-04-20 MED ORDER — GLUCOSE BLOOD VI STRP: ORAL_STRIP | Status: DC

## 2014-04-22 ENCOUNTER — Other Ambulatory Visit: Payer: Self-pay | Admitting: Internal Medicine

## 2014-07-05 ENCOUNTER — Other Ambulatory Visit: Payer: Self-pay | Admitting: Endocrinology

## 2014-07-15 ENCOUNTER — Other Ambulatory Visit (INDEPENDENT_AMBULATORY_CARE_PROVIDER_SITE_OTHER): Payer: 59

## 2014-07-15 DIAGNOSIS — E1165 Type 2 diabetes mellitus with hyperglycemia: Secondary | ICD-10-CM

## 2014-07-15 DIAGNOSIS — IMO0002 Reserved for concepts with insufficient information to code with codable children: Secondary | ICD-10-CM

## 2014-07-15 LAB — BASIC METABOLIC PANEL
BUN: 21 mg/dL (ref 6–23)
CALCIUM: 9.4 mg/dL (ref 8.4–10.5)
CO2: 28 meq/L (ref 19–32)
CREATININE: 1 mg/dL (ref 0.4–1.5)
Chloride: 102 mEq/L (ref 96–112)
GFR: 78.27 mL/min (ref >60.00)
Glucose, Bld: 156 mg/dL — ABNORMAL HIGH (ref 70–99)
Potassium: 4.4 mEq/L (ref 3.5–5.1)
SODIUM: 136 meq/L (ref 135–145)

## 2014-07-15 LAB — HEMOGLOBIN A1C: HEMOGLOBIN A1C: 7.9 % — AB (ref 4.6–6.5)

## 2014-07-20 ENCOUNTER — Ambulatory Visit: Payer: 59 | Admitting: Endocrinology

## 2014-07-21 ENCOUNTER — Ambulatory Visit: Payer: Self-pay | Admitting: Endocrinology

## 2014-07-22 ENCOUNTER — Other Ambulatory Visit: Payer: Self-pay | Admitting: Pulmonary Disease

## 2014-07-22 ENCOUNTER — Ambulatory Visit (INDEPENDENT_AMBULATORY_CARE_PROVIDER_SITE_OTHER): Payer: 59 | Admitting: Endocrinology

## 2014-07-22 ENCOUNTER — Encounter: Payer: Self-pay | Admitting: Endocrinology

## 2014-07-22 VITALS — BP 119/61 | HR 69 | Temp 98.2°F | Resp 14 | Ht 68.0 in | Wt 172.4 lb

## 2014-07-22 DIAGNOSIS — E785 Hyperlipidemia, unspecified: Secondary | ICD-10-CM

## 2014-07-22 DIAGNOSIS — E1165 Type 2 diabetes mellitus with hyperglycemia: Secondary | ICD-10-CM

## 2014-07-22 DIAGNOSIS — IMO0002 Reserved for concepts with insufficient information to code with codable children: Secondary | ICD-10-CM

## 2014-07-22 MED ORDER — NIACIN-SIMVASTATIN ER 500-20 MG PO TB24: 1.0000 | ORAL_TABLET | Freq: Every day | ORAL | Status: DC

## 2014-07-22 NOTE — Patient Instructions (Addendum)
Switch Glimepride to 1/2 at bedtime  Please check blood sugars at least half the time about 2 hours after any meal and times per week on waking up. Please bring blood sugar monitor to each visit

## 2014-07-22 NOTE — Progress Notes (Signed)
Patient ID: [name] Little, male   DOB: 11-21-1950, 64 y.o.   MRN: [id number]   Reason for Appointment : Followup for Type 2 Diabetes   History of Present Illness          Diagnosis: Type 2 diabetes mellitus, date of diagnosis: 1994   Past history: He has been treated for his diabetes with Glucovance for many years  with variable control Also appears to have had minimal diabetes education and has been monitoring his blood sugars only sporadically Review of his A1c shows he has had levels of at least 8% since 2009 and high levels in the last 2 years. On his regimen of glyburide/metformin and low dose Januvia his blood sugars were fluctuating significantly ranging from 80-200+ and also occasional symptoms of hypoglycemia His glyburide was stopped because of significant variability in blood sugars and tendency to hypoglycemia.  Also his Januvia was increased from 25 mg daily therapeutic dose of 100 mg. He was started on low-dose Amaryl on his  visit in 3/15  Recent history:  Oral hypoglycemic drugs the patient is taking are: Janumet XR, Invokana, Amaryl 0.5 mg       His blood sugar again at home are usually fairly good except slightly higher in the morning recently They are again significantly lower than expected for his A1c of 7.9, this was previously 8.2% Again he is checking his blood sugars primarily fasting in the mornings with only a few readings later in the day He has started exercising with laying basketball and still watching his diet fairly well He has had one episode of mild hypoglycemia after lunch Has only occasional readings after his meals which look fairly good Fasting blood sugars are the highest of the day Has been taking his Invokana before breakfast as directed    Glucose monitoring:  0.5 times daily        Glucometer: One Touch.      Blood Glucose readings from download show   PRE-MEAL Breakfast  12 PM-4 PM  9 PM-11 PM  Bedtime Overall  Glucose range:  119-154    63-111   88, 113     Median:  137      119     Hypoglycemia frequency: Rare, once had a 63 reading at 2 PM  Self-care: The diet that the patient has been following is: tries to limit carbohydrates and portions.      Meals: 3 meals per day.  Physical activity: exercise: None      Dietician visit: Most recent: 12 years ago, has not made followup appointment               Side effects from medications have been none  Compliance with the medical regimen:  fair Retinal exam: Most recent: 2013.     Wt Readings from Last 3 Encounters:  07/22/14 172 lb 6.4 oz (78.2 kg)  04/14/14 172 lb 3.2 oz (78.109 kg)  01/12/14 177 lb (80.287 kg)   Lab Results  Component Value Date   HGBA1C 7.9* 07/15/2014   HGBA1C 8.2* 04/07/2014   HGBA1C 7.9* 01/07/2014   Lab Results  Component Value Date   MICROALBUR 0.3 04/07/2014   LDLCALC 76 04/07/2014   CREATININE 1.0 07/15/2014       Medication List       This list is accurate as of: 07/22/14  2:59 PM.  Always use your most recent med list.  aspirin 81 MG tablet  Take 81 mg by mouth at bedtime.     dextromethorphan-guaiFENesin 30-600 MG per 12 hr tablet  Commonly known as:  MUCINEX DM  Take 1 tablet by mouth every 12 (twelve) hours as needed.     freestyle lancets  28 GAUGE. USE AS DIRECTED.     glimepiride 1 MG tablet  Commonly known as:  AMARYL  TAKE 1 TABLET (1 MG TOTAL) BY MOUTH DAILY WITH BREAKFAST.     glucose blood test strip  Commonly known as:  ONE TOUCH ULTRA TEST  Use to check once daily dx code E11.65     ibuprofen 200 MG tablet  Commonly known as:  ADVIL,MOTRIN  3 tabs w/ meals every 8 hours as needed     INVOKANA 300 MG Tabs tablet  Generic drug:  canagliflozin  TAKE 1 TABLET BY MOUTH EVERY DAY     JANUMET XR 50-1000 MG Tb24  Generic drug:  SitaGLIPtin-MetFORMIN HCl  TAKE 2 TABLETS ONCE DAILY     latanoprost 0.005 % ophthalmic solution  Commonly known as:  XALATAN     mometasone 50 MCG/ACT nasal  spray  Commonly known as:  NASONEX  2 puffs twice daily     ONE TOUCH ULTRA SYSTEM KIT W/DEVICE Kit  Use to test once daily     oxymetazoline 0.05 % nasal spray  Commonly known as:  AFRIN  2 puffs twice daily as needed for 5 days before nasonex as needed     SIMCOR 500-20 MG 24 hr tablet  Generic drug:  niacin-simvastatin  TAKE 1 TABLET BY MOUTH AT BEDTIME.        Allergies: No Known Allergies  Past Medical History  Diagnosis Date  . Actinic keratosis   . Other and unspecified hyperlipidemia   . Obesity, unspecified   . Type II or unspecified type diabetes mellitus without mention of complication, not stated as uncontrolled   . Unspecified essential hypertension     Past Surgical History  Procedure Laterality Date  . Rotator cuff repair      Family History  Problem Relation Age of Onset  . COPD Father   . Diabetes Mother   . Cancer Brother   . Thyroid cancer Brother   . Colon cancer Neg Hx     Social History:  reports that he quit smoking about 16 years ago. His smoking use included Cigars. He has never used smokeless tobacco. He reports that he does not drink alcohol. His drug history is not on file.    Review of Systems       Lipids:  he has had diabetic dyslipidemia and taking Simcor low-dose for quite some time with adequate response However recently has run out so this prescription and has not seen his PCP recently  Lab Results  Component Value Date   CHOL 136 04/07/2014   HDL 48 04/07/2014   LDLCALC 76 04/07/2014   TRIG 59 04/07/2014   CHOLHDL 2.8 04/07/2014    The blood pressure has been  well controlled with Benicar,  taking only 10 mg         Physical Examination:  BP 119/61 mmHg  Pulse 69  Temp(Src) 98.2 F (36.8 C)  Resp 14  Ht '5\' 8"'  (1.727 m)  Wt 172 lb 6.4 oz (78.2 kg)  BMI 26.22 kg/m2  SpO2 97%  No ankle edema   ASSESSMENT:  Diabetes type 2 without obesity  His blood sugars are fairly well controlled overall as judged  by  his home blood sugars Again his A1c is higher than expected at 7.9 and not much better than previously Again he is checking blood sugars mostly fasting despite instructions to check more readings later in the day More recently is getting relatively high readings fasting compared to the rest of the day. He is doing better with exercise regimen and his wife is helping him watch his diet  For now will switch his Amaryl to bedtime and continue half a tablet of 1 mg  No change in Invokana and Janumet XR He will try to check more readings after meals also  Hyperlipidemia: He will restart [facility] as before   [facility] 07/22/2014, 2:59 PM

## 2014-08-03 ENCOUNTER — Other Ambulatory Visit: Payer: Self-pay | Admitting: Physician Assistant

## 2014-08-08 ENCOUNTER — Other Ambulatory Visit: Payer: Self-pay | Admitting: Endocrinology

## 2014-08-23 ENCOUNTER — Telehealth: Payer: Self-pay | Admitting: Endocrinology

## 2014-08-23 NOTE — Telephone Encounter (Signed)
[facility] called stating that the patients Rx Simcor is no longer available  Is there another alternative? Pharmacy call back: [phone]   Thank You

## 2014-08-23 NOTE — Telephone Encounter (Signed)
Zocor 20 and Niaspan 500 generics each QD

## 2014-08-23 NOTE — Telephone Encounter (Signed)
See below and please advise, Thanks!  

## 2014-08-24 NOTE — Telephone Encounter (Signed)
Pharmacy advised of note below. Lvom advising pt of change. Medication list updated.

## 2014-09-14 ENCOUNTER — Other Ambulatory Visit: Payer: Self-pay | Admitting: Physician Assistant

## 2014-10-18 ENCOUNTER — Other Ambulatory Visit (INDEPENDENT_AMBULATORY_CARE_PROVIDER_SITE_OTHER): Payer: 59

## 2014-10-18 DIAGNOSIS — E785 Hyperlipidemia, unspecified: Secondary | ICD-10-CM | POA: Diagnosis not present

## 2014-10-18 DIAGNOSIS — IMO0002 Reserved for concepts with insufficient information to code with codable children: Secondary | ICD-10-CM

## 2014-10-18 DIAGNOSIS — E1165 Type 2 diabetes mellitus with hyperglycemia: Secondary | ICD-10-CM

## 2014-10-18 LAB — BASIC METABOLIC PANEL
BUN: 24 mg/dL — AB (ref 6–23)
CALCIUM: 9.5 mg/dL (ref 8.4–10.5)
CO2: 26 mEq/L (ref 19–32)
Chloride: 103 mEq/L (ref 96–112)
Creatinine, Ser: 0.94 mg/dL (ref 0.40–1.50)
GFR: 85.94 mL/min (ref >60.00)
Glucose, Bld: 125 mg/dL — ABNORMAL HIGH (ref 70–99)
Potassium: 4.1 mEq/L (ref 3.5–5.1)
Sodium: 136 mEq/L (ref 135–145)

## 2014-10-18 LAB — COMPREHENSIVE METABOLIC PANEL
ALT: 19 U/L (ref 0–53)
AST: 19 U/L (ref 0–37)
Albumin: 4.2 g/dL (ref 3.5–5.2)
Alkaline Phosphatase: 45 U/L (ref 39–117)
BILIRUBIN TOTAL: 0.7 mg/dL (ref 0.2–1.2)
BUN: 24 mg/dL — AB (ref 6–23)
CALCIUM: 9.5 mg/dL (ref 8.4–10.5)
CO2: 26 mEq/L (ref 19–32)
CREATININE: 0.94 mg/dL (ref 0.40–1.50)
Chloride: 103 mEq/L (ref 96–112)
GFR: 85.94 mL/min (ref >60.00)
Glucose, Bld: 125 mg/dL — ABNORMAL HIGH (ref 70–99)
Potassium: 4.1 mEq/L (ref 3.5–5.1)
Sodium: 136 mEq/L (ref 135–145)
Total Protein: 7.2 g/dL (ref 6.0–8.3)

## 2014-10-18 LAB — LIPID PANEL
CHOLESTEROL: 104 mg/dL (ref 0–200)
HDL: 39.7 mg/dL (ref >39.00)
LDL Cholesterol: 51 mg/dL (ref 0–99)
NonHDL: 64.3
Total CHOL/HDL Ratio: 3
Triglycerides: 69 mg/dL (ref 0.0–149.0)
VLDL: 13.8 mg/dL (ref 0.0–40.0)

## 2014-10-18 LAB — HEMOGLOBIN A1C: HEMOGLOBIN A1C: 7.5 % — AB (ref 4.6–6.5)

## 2014-10-21 ENCOUNTER — Encounter: Payer: Self-pay | Admitting: Endocrinology

## 2014-10-21 ENCOUNTER — Ambulatory Visit (INDEPENDENT_AMBULATORY_CARE_PROVIDER_SITE_OTHER): Payer: 59 | Admitting: Endocrinology

## 2014-10-21 VITALS — BP 134/70 | HR 76 | Temp 97.6°F | Ht 68.0 in | Wt 169.4 lb

## 2014-10-21 DIAGNOSIS — E785 Hyperlipidemia, unspecified: Secondary | ICD-10-CM

## 2014-10-21 DIAGNOSIS — E119 Type 2 diabetes mellitus without complications: Secondary | ICD-10-CM | POA: Diagnosis not present

## 2014-10-21 NOTE — Progress Notes (Signed)
Pre visit review using our clinic review tool, if applicable. No additional management support is needed unless otherwise documented below in the visit note. 

## 2014-10-21 NOTE — Patient Instructions (Addendum)
Please check blood sugars at least half the time about 2 hours after any meal and 3 times per week on waking up.  Please bring blood sugar monitor to each visit. Recommended blood sugar levels about 2 hours after meal is 140-180 and on waking up 90-130  Walk or bike daily

## 2014-10-21 NOTE — Progress Notes (Signed)
Patient ID: [name] Little, male   DOB: 1950/12/03, 64 y.o.   MRN: [id number]   Reason for Appointment : Followup for Type 2 Diabetes   History of Present Illness          Diagnosis: Type 2 diabetes mellitus, date of diagnosis: 1994   Past history: He has been treated for his diabetes with Glucovance for many years  with variable control Also appears to have had minimal diabetes education and has been monitoring his blood sugars only sporadically Review of his A1c shows he has had levels of at least 8% since 2009 and high levels in the last 2 years. On his regimen of glyburide/metformin and low dose Januvia his blood sugars were fluctuating significantly ranging from 80-200+ and also occasional symptoms of hypoglycemia His glyburide was stopped because of significant variability in blood sugars and tendency to hypoglycemia.  Also his Januvia was increased from 25 mg daily therapeutic dose of 100 mg. He was started on low-dose Amaryl on his  visit in 3/15  Recent history:   Oral hypoglycemic drugs the patient is taking are: Janumet XR, Invokana, Amaryl 0.5 mg       He tends to have relatively high A1c despite fairly good blood sugar readings at home. However again he is checking blood sugars mostly in the morning Fasting readings appear to be high normal and variable; he thinks that they are higher only when he is eating later in the evening but not if he eats earlier in the evening; also may be more active if he is eating earlier in the evening Has not done any readings after supper  A1c has improved slightly Has been taking his Invokana before breakfast as directed    Glucose monitoring:  0.5 times daily        Glucometer: One Touch.      Blood Glucose readings from download show   PRE-MEAL Breakfast Lunch Dinner Bedtime Overall  Glucose range:  87-152   91, 129   75, 106   87     median:     120   Hypoglycemia frequency: Rare, none recently Self-care: The diet that the patient has  been following is: tries to limit carbohydrates and portions.      Meals: 3 meals per day.  Physical activity: exercise: some       Dietician visit: Most recent: 12 years ago, has not made followup appointment as discussed               Side effects from medications have been none  Compliance with the medical regimen:  fair Retinal exam: Most recent: 2013.     Wt Readings from Last 3 Encounters:  10/21/14 169 lb 6 oz (76.828 kg)  07/22/14 172 lb 6.4 oz (78.2 kg)  04/14/14 172 lb 3.2 oz (78.109 kg)   Lab Results  Component Value Date   HGBA1C 7.5* 10/18/2014   HGBA1C 7.9* 07/15/2014   HGBA1C 8.2* 04/07/2014   Lab Results  Component Value Date   MICROALBUR 0.3 04/07/2014   LDLCALC 51 10/18/2014   CREATININE 0.94 10/18/2014   CREATININE 0.94 10/18/2014       Medication List       This list is accurate as of: 10/21/14  9:05 AM.  Always use your most recent med list.               aspirin 81 MG tablet  Take 81 mg by mouth at bedtime.     freestyle lancets  28 GAUGE. USE AS DIRECTED.     glimepiride 1 MG tablet  Commonly known as:  AMARYL  TAKE 1 TABLET (1 MG TOTAL) BY MOUTH DAILY WITH BREAKFAST.     glucose blood test strip  Commonly known as:  ONE TOUCH ULTRA TEST  Use to check once daily dx code E11.65     ibuprofen 200 MG tablet  Commonly known as:  ADVIL,MOTRIN  3 tabs w/ meals every 8 hours as needed     INVOKANA 300 MG Tabs tablet  Generic drug:  canagliflozin  TAKE 1 TABLET BY MOUTH EVERY DAY     JANUMET XR 50-1000 MG Tb24  Generic drug:  SitaGLIPtin-MetFORMIN HCl  TAKE 2 TABLETS ONCE DAILY     latanoprost 0.005 % ophthalmic solution  Commonly known as:  XALATAN     mometasone 50 MCG/ACT nasal spray  Commonly known as:  NASONEX  2 puffs twice daily     niacin 500 MG CR tablet  Commonly known as:  NIASPAN  Take 500 mg by mouth at bedtime.     ONE TOUCH ULTRA SYSTEM KIT W/DEVICE Kit  Use to test once daily     oxymetazoline 0.05 % nasal  spray  Commonly known as:  AFRIN  2 puffs twice daily as needed for 5 days before nasonex as needed     simvastatin 20 MG tablet  Commonly known as:  ZOCOR  Take 20 mg by mouth daily.        Allergies: No Known Allergies  Past Medical History  Diagnosis Date  . Actinic keratosis   . Other and unspecified hyperlipidemia   . Obesity, unspecified   . Type II or unspecified type diabetes mellitus without mention of complication, not stated as uncontrolled   . Unspecified essential hypertension     Past Surgical History  Procedure Laterality Date  . Rotator cuff repair      Family History  Problem Relation Age of Onset  . COPD Father   . Diabetes Mother   . Cancer Brother   . Thyroid cancer Brother   . Colon cancer Neg Hx     Social History:  reports that he quit smoking about 16 years ago. His smoking use included Cigars. He has never used smokeless tobacco. He reports that he does not drink alcohol. His drug history is not on file.    Review of Systems       Lipids:  he has had diabetic dyslipidemia and was taking Simcor low-dose for quite some time with adequate response However recently his insurance will not cover this and he is taking niacin and Zocor separately He was told by the pharmacist to take the niacin with a snack at bedtime Previously did not take a snack with simcor  His HDL is still relatively low but LDL is excellent  Lab Results  Component Value Date   CHOL 104 10/18/2014   HDL 39.70 10/18/2014   LDLCALC 51 10/18/2014   TRIG 69.0 10/18/2014   CHOLHDL 3 10/18/2014    The blood pressure has been  well controlled with Benicar,  taking only 10 mg  Has glaucoma, followed every 3 months         Physical Examination:  BP 134/70 mmHg  Pulse 76  Temp(Src) 97.6 F (36.4 C) (Oral)  Ht '5\' 8"'  (1.727 m)  Wt 169 lb 6 oz (76.828 kg)  BMI 25.76 kg/m2  SpO2 97%  No ankle edema   ASSESSMENT:  Diabetes type 2  without obesity  His blood sugars  are fairly well controlled again  as judged by his home blood sugars Again his A1c is higher than expected at 7.5 but appears to be improving gradually Again he is checking blood sugars mostly fasting despite instructions to check more readings later in the day His fasting blood sugar is variable and he thinks this is related to the timing of his evening meal Average sugar in the morning is not much better with trying his Amaryl at bedtime No hypoglycemia during the day now He will continue Amaryl at night No change in Invokana and Janumet XR He will try to check more readings after meals also  Hyperlipidemia: He will continue same regimen, may take niacin at suppertime  Patient Instructions  Please check blood sugars at least half the time about 2 hours after any meal and 3 times per week on waking up.  Please bring blood sugar monitor to each visit. Recommended blood sugar levels about 2 hours after meal is 140-180 and on waking up 90-130  Walk or bike daily       Sherrill Buikema 10/21/2014, 9:05 AM

## 2014-10-28 ENCOUNTER — Other Ambulatory Visit: Payer: Self-pay | Admitting: Endocrinology

## 2014-11-16 ENCOUNTER — Other Ambulatory Visit: Payer: Self-pay | Admitting: Pulmonary Disease

## 2014-11-16 NOTE — Telephone Encounter (Signed)
One touch products refilled with defer to endocrinolgy Dwyane Dee)

## 2014-11-19 ENCOUNTER — Other Ambulatory Visit: Payer: 59

## 2014-11-19 ENCOUNTER — Ambulatory Visit (INDEPENDENT_AMBULATORY_CARE_PROVIDER_SITE_OTHER): Payer: 59 | Admitting: Family

## 2014-11-19 ENCOUNTER — Encounter: Payer: Self-pay | Admitting: Family

## 2014-11-19 ENCOUNTER — Other Ambulatory Visit (INDEPENDENT_AMBULATORY_CARE_PROVIDER_SITE_OTHER): Payer: 59

## 2014-11-19 VITALS — BP 130/76 | HR 71 | Temp 97.7°F | Resp 18 | Ht 68.0 in | Wt 170.0 lb

## 2014-11-19 DIAGNOSIS — J302 Other seasonal allergic rhinitis: Secondary | ICD-10-CM | POA: Insufficient documentation

## 2014-11-19 DIAGNOSIS — E119 Type 2 diabetes mellitus without complications: Secondary | ICD-10-CM

## 2014-11-19 DIAGNOSIS — R109 Unspecified abdominal pain: Secondary | ICD-10-CM | POA: Diagnosis not present

## 2014-11-19 DIAGNOSIS — R2242 Localized swelling, mass and lump, left lower limb: Secondary | ICD-10-CM | POA: Insufficient documentation

## 2014-11-19 LAB — POCT URINALYSIS DIPSTICK
Bilirubin, UA: NEGATIVE
Ketones, UA: NEGATIVE
Leukocytes, UA: NEGATIVE
Nitrite, UA: NEGATIVE
Protein, UA: NEGATIVE
RBC UA: NEGATIVE
SPEC GRAV UA: 1.02
Urobilinogen, UA: NEGATIVE
pH, UA: 6

## 2014-11-19 LAB — MICROALBUMIN / CREATININE URINE RATIO
CREATININE, U: 31.3 mg/dL
MICROALB/CREAT RATIO: 2.2 mg/g (ref 0.0–30.0)
Microalb, Ur: <0.7 mg/dL (ref 0.0–1.9)

## 2014-11-19 MED ORDER — MOMETASONE FUROATE 50 MCG/ACT NA SUSP
2.0000 | Freq: Every day | NASAL | Status: AC
Start: 2014-11-19 — End: ?

## 2014-11-19 NOTE — Patient Instructions (Addendum)
Thank you for choosing [facility].  Summary/Instructions:  Please schedule a time for your physical.  Please talk with Dr. Dwyane Dee regarding the [place].   Your prescription(s) have been submitted to your pharmacy or been printed and provided for you. Please take as directed and contact our office if you believe you are having problem(s) with the medication(s) or have any questions.  Referrals have been made during this visit. You should expect to hear back from our schedulers in about 7-10 days in regards to establishing an appointment with the specialists we discussed.   If your symptoms worsen or fail to improve, please contact our office for further instruction, or in case of emergency go directly to the emergency room at the closest medical facility.

## 2014-11-19 NOTE — Assessment & Plan Note (Signed)
Remain controlled with current regimen. Continue current dosage of Nasonex.

## 2014-11-19 NOTE — Progress Notes (Signed)
Pre visit review using our clinic review tool, if applicable. No additional management support is needed unless otherwise documented below in the visit note. 

## 2014-11-19 NOTE — Progress Notes (Signed)
Subjective:    Patient ID: [name] Little, male    DOB: June 02, 1951, 64 y.o.   MRN: [id number]  Chief Complaint  Patient presents with  . Establish Care    Needs a refill for nasonex, having pain in side and sometimes hurts when going to the bathroom and after going it stops, has a knot on left leg,     HPI:  [name] Little is a 64 y.o. male with a PMH of hypertension, Type 2 diabetes, hyperlipidemia and BPH who presents today for an office visit to establish care.  1) Seasonal allergies - Stable and maintained with Nasonex. Would like a refill of the nasonex.  2) Pain in Side - Associated symptom of pain located in his side has been going on since he started the Invokana. Describes the pain as a pressure. When he needs to use the bathroom he experiences this pressure until he goes to the bathroom. Denies frequency, urgency, fevers, chills, or dysuria. Timing of the symptoms is waxing and waning depending on if his bladder is full or not.   3) Knot on leg - Associated symptom of a "knot" on his left upper leg has been going on for about several weeks. States that his whole leg turned black. There may have been the feeling of pulling something when it originally happened. Was seen in the Urgent Care for this and did not have a blood clot and was diagnosed with a muscle injury.  No Known Allergies   Current Outpatient Prescriptions on File Prior to Visit  Medication Sig Dispense Refill  . aspirin 81 MG tablet Take 81 mg by mouth at bedtime.     . Blood Glucose Monitoring Suppl (ONE TOUCH ULTRA SYSTEM KIT) W/DEVICE KIT Use to test once daily 1 each PRN  . glimepiride (AMARYL) 1 MG tablet TAKE 1 TABLET (1 MG TOTAL) BY MOUTH DAILY WITH BREAKFAST. 30 tablet 3  . glucose blood (ONE TOUCH ULTRA TEST) test strip Use to check once daily dx code E11.65 50 each 5  . ibuprofen (ADVIL,MOTRIN) 200 MG tablet 3 tabs w/ meals every 8 hours as needed    . INVOKANA 300 MG TABS tablet TAKE 1 TABLET BY  MOUTH EVERY DAY 30 tablet 3  . JANUMET XR 50-1000 MG TB24 TAKE 2 TABLETS ONCE DAILY 60 tablet 3  . latanoprost (XALATAN) 0.005 % ophthalmic solution   2  . mometasone (NASONEX) 50 MCG/ACT nasal spray 2 puffs twice daily    . niacin (NIASPAN) 500 MG CR tablet TAKE 1 TABLET DAILY 30 tablet 2  . ONETOUCH DELICA LANCETS FINE MISC USE TO TEST ONCE A DAY 100 each 2  . oxymetazoline (AFRIN) 0.05 % nasal spray 2 puffs twice daily as needed for 5 days before nasonex as needed    . simvastatin (ZOCOR) 20 MG tablet TAKE 1 TABLET DAILY 30 tablet 2   No current facility-administered medications on file prior to visit.    Past Medical History  Diagnosis Date  . Actinic keratosis   . Other and unspecified hyperlipidemia   . Obesity, unspecified   . Type II or unspecified type diabetes mellitus without mention of complication, not stated as uncontrolled   . Unspecified essential hypertension     Past Surgical History  Procedure Laterality Date  . Rotator cuff repair      Family History  Problem Relation Age of Onset  . COPD Father   . Diabetes Mother   . Cancer Brother   . Thyroid  cancer Brother   . Colon cancer Neg Hx     History   Social History  . Marital Status: Married    Spouse Name: N/A  . Number of Children: 2  . Years of Education: 10   Occupational History  . [employer]    Social History Main Topics  . Smoking status: Former Smoker -- 6.00 packs/day for 12 years    Types: Cigars    Quit date: 07/09/1998  . Smokeless tobacco: Never Used  . Alcohol Use: No  . Drug Use: No  . Sexual Activity: Not on file   Other Topics Concern  . Not on file   Social History Narrative   Fun: Woodworking, playing basketball   Denies religious beliefs effecting health care.     Review of Systems  Constitutional: Negative for fever and chills.  HENT: Negative for congestion and rhinorrhea.   Eyes: Negative for itching.  Genitourinary: Positive for flank pain (pressure).  Negative for dysuria, urgency, frequency, penile swelling, scrotal swelling, penile pain and testicular pain.  Musculoskeletal: Negative for arthralgias.  Neurological: Negative for headaches.      Objective:    BP 130/76 mmHg  Pulse 71  Temp(Src) 97.7 F (36.5 C) (Oral)  Resp 18  Ht 5' 8" (1.727 m)  Wt 170 lb (77.111 kg)  BMI 25.85 kg/m2  SpO2 98% Nursing note and vital signs reviewed.  Physical Exam  Constitutional: He is oriented to person, place, and time. He appears well-developed and well-nourished. No distress.  Cardiovascular: Normal rate, regular rhythm, normal heart sounds and intact distal pulses.   Pulmonary/Chest: Effort normal and breath sounds normal.  Musculoskeletal:  No obvious discoloration or edema of left thigh noted. Proximal one third slightly larger compared to the contralateral side. Palpable nontender mass located proximal one third of thigh which is firm and movable. Muscle strength of quadricep muscles is normal bilaterally. Hip flexion is normal bilaterally. Distal pulses, sensation, and reflexes are intact and appropriate.  Neurological: He is alert and oriented to person, place, and time.  Skin: Skin is warm and dry.  Psychiatric: He has a normal mood and affect. His behavior is normal. Judgment and thought content normal.       Assessment & Plan:

## 2014-11-19 NOTE — Assessment & Plan Note (Signed)
Question potential partial quadriceps tendon tear. Patient does have good strength and no pain with movements. Refer to sports medicine for potential ultrasound imaging versus obtaining MRI. Follow up if symptoms worsen or fail to improve.

## 2014-11-19 NOTE — Assessment & Plan Note (Signed)
In office urinalysis is negative for nitrites, leukocytes, and hematuria. Positive for glucose. Low suspicion for urinary tract infection, however we'll send urine for culture. Question if feelings of pressure are related to use of Invokana since symptoms started around that time. Patient will check with Endocrinology to determine continued use of Invokana. Based on his current symptoms there is no compelling reason to stop it. Follow up if symptoms worsen or fail to improve.

## 2014-11-20 LAB — URINE CULTURE
Colony Count: NO GROWTH
Organism ID, Bacteria: NO GROWTH

## 2014-11-22 ENCOUNTER — Telehealth: Payer: Self-pay | Admitting: Family

## 2014-11-22 NOTE — Telephone Encounter (Signed)
Pts wife answered she is aware of pts results and will relay the message to him. Asked to call back if there was anymore questions or concerns.

## 2014-11-22 NOTE — Telephone Encounter (Signed)
Please inform patient that his urine culture showed no evidence of infection.

## 2014-11-23 ENCOUNTER — Other Ambulatory Visit: Payer: Self-pay | Admitting: Endocrinology

## 2014-11-23 ENCOUNTER — Telehealth: Payer: Self-pay | Admitting: *Deleted

## 2014-11-23 NOTE — Telephone Encounter (Signed)
Patient called, he said at his last visit he talked to you about back pain that he was having and you told him to see his PCP, he did see Dr. Elna Breslow and he told [name] he thought the pain was caused by the [place].  He wanted [name] to stop taking it for a couple of weeks to see if it got better, patient wants to know if that's okay with you?

## 2014-11-23 NOTE — Telephone Encounter (Signed)
Noted, patient is aware and will call back in a week to let you know how he's doing.

## 2014-11-23 NOTE — Telephone Encounter (Signed)
He can stop it for a week only as a trial because blood sugars will start going up. There is no reason why  Invokana will cause low back pain.  It has no effects on the kidney in that way

## 2014-12-09 ENCOUNTER — Ambulatory Visit (INDEPENDENT_AMBULATORY_CARE_PROVIDER_SITE_OTHER): Payer: 59 | Admitting: Family Medicine

## 2014-12-09 ENCOUNTER — Other Ambulatory Visit (INDEPENDENT_AMBULATORY_CARE_PROVIDER_SITE_OTHER): Payer: 59

## 2014-12-09 ENCOUNTER — Encounter: Payer: Self-pay | Admitting: Family Medicine

## 2014-12-09 VITALS — BP 120/76 | HR 80 | Ht 68.0 in | Wt 166.0 lb

## 2014-12-09 DIAGNOSIS — M79605 Pain in left leg: Secondary | ICD-10-CM

## 2014-12-09 DIAGNOSIS — S76112A Strain of left quadriceps muscle, fascia and tendon, initial encounter: Secondary | ICD-10-CM | POA: Diagnosis not present

## 2014-12-09 NOTE — Progress Notes (Signed)
[name] [address] [place], [place] [address] Phone: [phone] Subjective:    I'm seeing this patient by the request  of:  Mauricio Po, FNP   CC: Left thigh pain and mass  [id number] [name] is a 64 y.o. male coming in with complaint of left thigh pain and mass. Patient was seen by primary care provider recently and there was concern for possible quadricep injury. Patient noticed the injury initially when he was playing basket all. Patient felt a sharp pain on the anterior aspect of his thigh. Patient states that he did have some swelling and discoloration initially. Patient then 2 days later tried to start working his yard and had significant blackness of the leg. Patient was sent to urgent care who then sent him to emergency room to rule out deep venous thrombosis which was workup was negative. Patient states over the course of time the bump has decrease in size considerably. Patient states that he is not having any significant pain. States that he can be uncomfortable when he tries to jump. Denies any radiation past his knee, denies any associated back pain. States that all the discoloration has resolved. Rates the severity of pain 2 out of 10. Still able to do daily activities and work to [place] are the other day.     Past Medical History  Diagnosis Date  . Actinic keratosis   . Other and unspecified hyperlipidemia   . Obesity, unspecified   . Type II or unspecified type diabetes mellitus without mention of complication, not stated as uncontrolled   . Unspecified essential hypertension   .psh History  Substance Use Topics  . Smoking status: Former Smoker -- 6.00 packs/day for 12 years    Types: Cigars    Quit date: 07/09/1998  . Smokeless tobacco: Never Used  . Alcohol Use: No   No Known Allergies Family History  Problem Relation Age of Onset  . COPD Father   . Diabetes Mother   . Cancer Brother   . Thyroid cancer Brother     . Colon cancer Neg Hx      Past medical history, social, surgical and family history all reviewed in electronic medical record.   Review of Systems: No headache, visual changes, nausea, vomiting, diarrhea, constipation, dizziness, abdominal pain, skin rash, fevers, chills, night sweats, weight loss, swollen lymph nodes, body aches, joint swelling, muscle aches, chest pain, shortness of breath, mood changes.   Objective Blood pressure 120/76, pulse 80, height 5\' 8"  (1.727 m), weight 166 lb (75.297 kg), SpO2 97 %.  General: No apparent distress alert and oriented x3 mood and affect normal, dressed appropriately.  HEENT: Pupils equal, extraocular movements intact  Respiratory: Patient's speak in full sentences and does not appear short of breath  Cardiovascular: No lower extremity edema, non tender, no erythema  Skin: Warm dry intact with no signs of infection or rash on extremities or on axial skeleton.  Abdomen: Soft nontender  Neuro: Cranial nerves II through XII are intact, neurovascularly intact in all extremities with 2+ DTRs and 2+ pulses.  Lymph: No lymphadenopathy of posterior or anterior cervical chain or axillae bilaterally.  Gait normal with good balance and coordination.  MSK:  Non tender with full range of motion and good stability and symmetric strength and tone of shoulders, elbows, wrist, hip, and ankles bilaterally.  Knee: Left Normal to inspection with no erythema or effusion or obvious bony abnormalities. Palpation normal with no warmth, joint line tenderness,  patellar tenderness, or condyle tenderness. Mild fullness of the quadriceps compared to the contralateral side ROM full in flexion and extension and lower leg rotation. Ligaments with solid consistent endpoints including ACL, PCL, LCL, MCL. Negative Mcmurray's, Apley's, and Thessalonian tests. Non painful patellar compression. Patellar glide without crepitus. Patellar and quadriceps tendons  unremarkable. Hamstring and quadriceps strength is normal.  Contralateral knee unremarkable.   MSK US performed of: Left This study was ordered, performed, and interpreted by Charlann Boxer D.O.  Knee: All structures visualized. Anteromedial, anterolateral, posteromedial, and posterolateral menisci unremarkable without tearing, fraying, effusion, or displacement. Patellar Tendon unremarkable on long and transverse views without effusion. Quadricep tendon intact. Patient's quadricep muscle though superficial does have a tear. This approximate 2.5 cm in length. Seems to be resolving hematoma noted. No abnormality of prepatellar bursa. LCL and MCL unremarkable on long and transverse views. No abnormality of origin of medial or lateral head of the gastrocnemius.  IMPRESSION:  Healing tear in the rectus femoris left side.  Procedure note 60737; 15 minutes spent for Therapeutic exercises as stated in above notes.  This included exercises focusing on stretching, strengthening, with significant focus on eccentric aspects.  Given exercises for vastus medialis oblique, exercises also for stretching as well as avoiding any significant repetitive high-intensity interval exercises. Proper technique shown and discussed handout in great detail with ATC.  All questions were discussed and answered.     Impression and Recommendations:     This case required medical decision making of moderate complexity.

## 2014-12-09 NOTE — Patient Instructions (Signed)
Good to see you.  Quadriceps tear but healing.  Ice 20 minutes 2 times daily. Usually after activity and before bed. Compression sleeve from [employer] or [employer] can help.  Exercises 3 times a week.  If not resolved see me again i n3-4 weeks.

## 2014-12-09 NOTE — Assessment & Plan Note (Signed)
Patient does have tearing the course of muscle but is healing on its own accord. We discussed compression, topical anti-phlegm towards, icing, as well as to monitor his working out. Patient will do home exercise program and will come back and see me again in 3-4 weeks if it doesn't completely resolved.

## 2014-12-09 NOTE — Progress Notes (Signed)
Pre visit review using our clinic review tool, if applicable. No additional management support is needed unless otherwise documented below in the visit note. 

## 2014-12-14 ENCOUNTER — Encounter: Payer: Self-pay | Admitting: Family

## 2014-12-14 ENCOUNTER — Telehealth: Payer: Self-pay | Admitting: Family

## 2014-12-14 ENCOUNTER — Ambulatory Visit (INDEPENDENT_AMBULATORY_CARE_PROVIDER_SITE_OTHER): Payer: 59 | Admitting: Family

## 2014-12-14 ENCOUNTER — Other Ambulatory Visit (INDEPENDENT_AMBULATORY_CARE_PROVIDER_SITE_OTHER): Payer: 59

## 2014-12-14 VITALS — BP 130/76 | HR 66 | Temp 97.5°F | Resp 18 | Ht 68.0 in | Wt 162.0 lb

## 2014-12-14 DIAGNOSIS — Z0000 Encounter for general adult medical examination without abnormal findings: Secondary | ICD-10-CM | POA: Diagnosis not present

## 2014-12-14 LAB — LIPID PANEL
CHOLESTEROL: 103 mg/dL (ref 0–200)
HDL: 41.5 mg/dL (ref >39.00)
LDL CALC: 50 mg/dL (ref 0–99)
NONHDL: 61.5
Total CHOL/HDL Ratio: 2
Triglycerides: 60 mg/dL (ref 0.0–149.0)
VLDL: 12 mg/dL (ref 0.0–40.0)

## 2014-12-14 LAB — COMPREHENSIVE METABOLIC PANEL
ALT: 18 U/L (ref 0–53)
AST: 19 U/L (ref 0–37)
Albumin: 4.4 g/dL (ref 3.5–5.2)
Alkaline Phosphatase: 51 U/L (ref 39–117)
BUN: 17 mg/dL (ref 6–23)
CALCIUM: 9.3 mg/dL (ref 8.4–10.5)
CO2: 29 mEq/L (ref 19–32)
Chloride: 103 mEq/L (ref 96–112)
Creatinine, Ser: 0.97 mg/dL (ref 0.40–1.50)
GFR: 82.84 mL/min (ref >60.00)
GLUCOSE: 125 mg/dL — AB (ref 70–99)
Potassium: 4.4 mEq/L (ref 3.5–5.1)
SODIUM: 138 meq/L (ref 135–145)
TOTAL PROTEIN: 6.9 g/dL (ref 6.0–8.3)
Total Bilirubin: 0.4 mg/dL (ref 0.2–1.2)

## 2014-12-14 LAB — CBC
HCT: 46.2 % (ref 39.0–52.0)
HEMOGLOBIN: 15.3 g/dL (ref 13.0–17.0)
MCHC: 33.1 g/dL (ref 30.0–36.0)
MCV: 91.4 fl (ref 78.0–100.0)
Platelets: 153 10*3/uL (ref 150.0–400.0)
RBC: 5.05 Mil/uL (ref 4.22–5.81)
RDW: 13.8 % (ref 11.5–15.5)
WBC: 7.6 10*3/uL (ref 4.0–10.5)

## 2014-12-14 LAB — TSH: TSH: 1.54 u[IU]/mL (ref 0.35–4.50)

## 2014-12-14 LAB — PSA: PSA: 0.89 ng/mL (ref 0.10–4.00)

## 2014-12-14 NOTE — Telephone Encounter (Signed)
Patient called advised of lab results

## 2014-12-14 NOTE — Patient Instructions (Addendum)
Thank you for choosing [facility].  Summary/Instructions:   Please stop by the lab on the basement level of the building for your blood work. Your results will be released to MyChart (or called to you) after review, usually within 72 hours after test completion. If any changes need to be made, you will be notified at that same time.  Health Maintenance A healthy lifestyle and preventative care can promote health and wellness.  Maintain regular health, dental, and eye exams.  Eat a healthy diet. Foods like vegetables, fruits, whole grains, low-fat dairy products, and lean protein foods contain the nutrients you need and are low in calories. Decrease your intake of foods high in solid fats, added sugars, and salt. Get information about a proper diet from your health care provider, if necessary.  Regular physical exercise is one of the most important things you can do for your health. Most adults should get at least 150 minutes of moderate-intensity exercise (any activity that increases your heart rate and causes you to sweat) each week. In addition, most adults need muscle-strengthening exercises on 2 or more days a week.   Maintain a healthy weight. The body mass index (BMI) is a screening tool to identify possible weight problems. It provides an estimate of body fat based on height and weight. Your health care provider can find your BMI and can help you achieve or maintain a healthy weight. For males 20 years and older:  A BMI below 18.5 is considered underweight.  A BMI of 18.5 to 24.9 is normal.  A BMI of 25 to 29.9 is considered overweight.  A BMI of 30 and above is considered obese.  Maintain normal blood lipids and cholesterol by exercising and minimizing your intake of saturated fat. Eat a balanced diet with plenty of fruits and vegetables. Blood tests for lipids and cholesterol should begin at age 20 and be repeated every 5 years. If your lipid or cholesterol levels are  high, you are over age 50, or you are at high risk for heart disease, you may need your cholesterol levels checked more frequently.Ongoing high lipid and cholesterol levels should be treated with medicines if diet and exercise are not working.  If you smoke, find out from your health care provider how to quit. If you do not use tobacco, do not start.  Lung cancer screening is recommended for adults aged 55-80 years who are at high risk for developing lung cancer because of a history of smoking. A yearly low-dose CT scan of the lungs is recommended for people who have at least a 30-pack-year history of smoking and are current smokers or have quit within the past 15 years. A pack year of smoking is smoking an average of 1 pack of cigarettes a day for 1 year (for example, a 30-pack-year history of smoking could mean smoking 1 pack a day for 30 years or 2 packs a day for 15 years). Yearly screening should continue until the smoker has stopped smoking for at least 15 years. Yearly screening should be stopped for people who develop a health problem that would prevent them from having lung cancer treatment.  If you choose to drink alcohol, do not have more than 2 drinks per day. One drink is considered to be 12 oz (360 mL) of beer, 5 oz (150 mL) of wine, or 1.5 oz (45 mL) of liquor.  Avoid the use of street drugs. Do not share needles with anyone. Ask for help if you need   support or instructions about stopping the use of drugs.  High blood pressure causes heart disease and increases the risk of stroke. Blood pressure should be checked at least every 1-2 years. Ongoing high blood pressure should be treated with medicines if weight loss and exercise are not effective.  If you are 45-79 years old, ask your health care provider if you should take aspirin to prevent heart disease.  Diabetes screening involves taking a blood sample to check your fasting blood sugar level. This should be done once every 3 years  after age 45 if you are at a normal weight and without risk factors for diabetes. Testing should be considered at a younger age or be carried out more frequently if you are overweight and have at least 1 risk factor for diabetes.  Colorectal cancer can be detected and often prevented. Most routine colorectal cancer screening begins at the age of 50 and continues through age 75. However, your health care provider may recommend screening at an earlier age if you have risk factors for colon cancer. On a yearly basis, your health care provider may provide home test kits to check for hidden blood in the stool. A small camera at the end of a tube may be used to directly examine the colon (sigmoidoscopy or colonoscopy) to detect the earliest forms of colorectal cancer. Talk to your health care provider about this at age 50 when routine screening begins. A direct exam of the colon should be repeated every 5-10 years through age 75, unless early forms of precancerous polyps or small growths are found.  People who are at an increased risk for hepatitis B should be screened for this virus. You are considered at high risk for hepatitis B if:  You were born in a country where hepatitis B occurs often. Talk with your health care provider about which countries are considered high risk.  Your parents were born in a high-risk country and you have not received a shot to protect against hepatitis B (hepatitis B vaccine).  You have HIV or AIDS.  You use needles to inject street drugs.  You live with, or have sex with, someone who has hepatitis B.  You are a man who has sex with other men (MSM).  You get hemodialysis treatment.  You take certain medicines for conditions like cancer, organ transplantation, and autoimmune conditions.  Hepatitis C blood testing is recommended for all people born from 1945 through 1965 and any individual with known risk factors for hepatitis C.  Healthy men should no longer receive  prostate-specific antigen (PSA) blood tests as part of routine cancer screening. Talk to your health care provider about prostate cancer screening.  Testicular cancer screening is not recommended for adolescents or adult males who have no symptoms. Screening includes self-exam, a health care provider exam, and other screening tests. Consult with your health care provider about any symptoms you have or any concerns you have about testicular cancer.  Practice safe sex. Use condoms and avoid high-risk sexual practices to reduce the spread of sexually transmitted infections (STIs).  You should be screened for STIs, including gonorrhea and chlamydia if:  You are sexually active and are younger than 24 years.  You are older than 24 years, and your health care provider tells you that you are at risk for this type of infection.  Your sexual activity has changed since you were last screened, and you are at an increased risk for chlamydia or gonorrhea. Ask your health care   provider if you are at risk.  If you are at risk of being infected with HIV, it is recommended that you take a prescription medicine daily to prevent HIV infection. This is called pre-exposure prophylaxis (PrEP). You are considered at risk if:  You are a man who has sex with other men (MSM).  You are a heterosexual man who is sexually active with multiple partners.  You take drugs by injection.  You are sexually active with a partner who has HIV.  Talk with your health care provider about whether you are at high risk of being infected with HIV. If you choose to begin PrEP, you should first be tested for HIV. You should then be tested every 3 months for as long as you are taking PrEP.  Use sunscreen. Apply sunscreen liberally and repeatedly throughout the day. You should seek shade when your shadow is shorter than you. Protect yourself by wearing long sleeves, pants, a wide-brimmed hat, and sunglasses year round whenever you are  outdoors.  Tell your health care provider of new moles or changes in moles, especially if there is a change in shape or color. Also, tell your health care provider if a mole is larger than the size of a pencil eraser.  A one-time screening for abdominal aortic aneurysm (AAA) and surgical repair of large AAAs by ultrasound is recommended for men aged 65-75 years who are current or former smokers.  Stay current with your vaccines (immunizations). Document Released: 12/22/2007 Document Revised: 06/30/2013 Document Reviewed: 11/20/2010 ExitCare Patient Information 2015 [employer]. This information is not intended to replace advice given to you by your health care provider. Make sure you discuss any questions you have with your health care provider.   

## 2014-12-14 NOTE — Progress Notes (Signed)
Subjective:    Patient ID: [name] [facility], male    DOB: Mar 19, 1951, 64 y.o.   MRN: [id number]  Chief Complaint  Patient presents with  . CPE    fasting    HPI:  [name] Little is a 64 y.o. male who presents today for an annual wellness visit.   1) Health Maintenance -   Diet -  Averages about 3 meals per day consisting of fruits, vegetables, proteins. Has a garden that he grows vegetables in; Caffeine drinks about 1 pot of coffee and 2-3 diet sodas.   Exercise - Does at work and climbing ladder - no structured program.    2) Preventative Exams / Immunizations:  Dental -- Up to date  Vision -- Up to date - completed in May   Health Maintenance  Topic Date Due  . HIV Screening  01/10/1966  . ZOSTAVAX  01/11/2011  . COLONOSCOPY  06/20/2011  . INFLUENZA VACCINE  02/07/2015  . FOOT EXAM  04/15/2015  . HEMOGLOBIN A1C  04/19/2015  . OPHTHALMOLOGY EXAM  11/16/2015  . URINE MICROALBUMIN  11/19/2015  . PNEUMOCOCCAL POLYSACCHARIDE VACCINE (2) 04/29/2017  . TETANUS/TDAP  06/26/2017  Declines shingles;   Immunization History  Administered Date(s) Administered  . Influenza Split 04/29/2012  . Influenza Whole 04/08/2009, 04/09/2011  . Influenza,inj,Quad PF,36+ Mos 03/31/2013, 04/14/2014  . PPD Test 07/01/2008  . Pneumococcal Polysaccharide-23 04/29/2012  . Td 06/27/2007    No Known Allergies   Outpatient Prescriptions Prior to Visit  Medication Sig Dispense Refill  . aspirin 81 MG tablet Take 81 mg by mouth at bedtime.     . Blood Glucose Monitoring Suppl (ONE TOUCH ULTRA SYSTEM KIT) W/DEVICE KIT Use to test once daily 1 each PRN  . glimepiride (AMARYL) 1 MG tablet TAKE 1 TABLET (1 MG TOTAL) BY MOUTH DAILY WITH BREAKFAST. 30 tablet 3  . glucose blood (ONE TOUCH ULTRA TEST) test strip Use to check once daily dx code E11.65 50 each 5  . ibuprofen (ADVIL,MOTRIN) 200 MG tablet 3 tabs w/ meals every 8 hours as needed    . INVOKANA 300 MG TABS tablet TAKE 1 TABLET BY  MOUTH EVERY DAY 30 tablet 3  . JANUMET XR 50-1000 MG TB24 TAKE 2 TABLETS ONCE DAILY 60 tablet 3  . JANUMET XR 50-1000 MG TB24 TAKE 2 TABLETS ONCE DAILY 60 tablet 3  . latanoprost (XALATAN) 0.005 % ophthalmic solution   2  . mometasone (NASONEX) 50 MCG/ACT nasal spray Place 2 sprays into the nose daily. 2 puffs twice daily 17 g 11  . niacin (NIASPAN) 500 MG CR tablet TAKE 1 TABLET DAILY 30 tablet 2  . ONETOUCH DELICA LANCETS FINE MISC USE TO TEST ONCE A DAY 100 each 2  . oxymetazoline (AFRIN) 0.05 % nasal spray 2 puffs twice daily as needed for 5 days before nasonex as needed    . simvastatin (ZOCOR) 20 MG tablet TAKE 1 TABLET DAILY 30 tablet 2   No facility-administered medications prior to visit.    Past Medical History  Diagnosis Date  . Actinic keratosis   . Other and unspecified hyperlipidemia   . Obesity, unspecified   . Type II or unspecified type diabetes mellitus without mention of complication, not stated as uncontrolled   . Unspecified essential hypertension     Past Surgical History  Procedure Laterality Date  . Rotator cuff repair       Family History  Problem Relation Age of Onset  . COPD Father   .  Diabetes Mother   . Cancer Brother   . Thyroid cancer Brother   . Colon cancer Neg Hx     History   Social History  . Marital Status: Married    Spouse Name: N/A  . Number of Children: 2  . Years of Education: 10   Occupational History  . [employer]    Social History Main Topics  . Smoking status: Former Smoker -- 6.00 packs/day for 12 years    Types: Cigars    Quit date: 07/09/1998  . Smokeless tobacco: Never Used  . Alcohol Use: No  . Drug Use: No  . Sexual Activity: Not on file   Other Topics Concern  . Not on file   Social History Narrative   Fun: Woodworking, playing basketball   Denies religious beliefs effecting health care.     Review of Systems  Constitutional: Denies fever, chills, fatigue, or significant weight  gain/loss. HENT: Head: Denies headache or neck pain Ears: Denies changes in hearing, ringing in ears, earache, drainage Nose: Denies discharge, stuffiness, itching, nosebleed, sinus pain Throat: Denies sore throat, hoarseness, dry mouth, sores, thrush Eyes: Denies loss/changes in vision, pain, redness, blurry/double vision, flashing lights Cardiovascular: Denies chest pain/discomfort, tightness, palpitations, shortness of breath with activity, difficulty lying down, swelling, sudden awakening with shortness of breath Respiratory: Denies shortness of breath, cough, sputum production, wheezing Gastrointestinal: Denies dysphasia, heartburn, change in appetite, nausea, change in bowel habits, rectal bleeding, constipation, diarrhea, yellow skin or eyes Genitourinary: Denies frequency, urgency, burning/pain, blood in urine, incontinence, change in urinary strength. Musculoskeletal: Denies muscle/joint pain, stiffness, back pain, redness or swelling of joints, trauma Skin: Denies rashes, lumps, itching, dryness, color changes, or hair/nail changes Neurological: Denies dizziness, fainting, seizures, weakness, numbness, tingling, tremor Psychiatric - Denies nervousness, stress, depression or memory loss Endocrine: Denies heat or cold intolerance, sweating, frequent urination, excessive thirst, changes in appetite Hematologic: Denies ease of bruising or bleeding     Objective:     BP 130/76 mmHg  Pulse 66  Temp(Src) 97.5 F (36.4 C) (Oral)  Resp 18  Ht '5\' 8"'  (1.727 m)  Wt 162 lb (73.483 kg)  BMI 24.64 kg/m2  SpO2 98% Nursing note and vital signs reviewed.  Physical Exam  Constitutional: He is oriented to person, place, and time. He appears well-developed and well-nourished.  HENT:  Head: Normocephalic.  Right Ear: Hearing, tympanic membrane, external ear and ear canal normal.  Left Ear: Hearing, tympanic membrane, external ear and ear canal normal.  Nose: Nose normal.  Mouth/Throat:  Uvula is midline, oropharynx is clear and moist and mucous membranes are normal.  Eyes: Conjunctivae and EOM are normal. Pupils are equal, round, and reactive to light.  Neck: Neck supple. No JVD present. No tracheal deviation present. No thyromegaly present.  Cardiovascular: Normal rate, regular rhythm, normal heart sounds and intact distal pulses.   Pulmonary/Chest: Effort normal and breath sounds normal.  Abdominal: Soft. Bowel sounds are normal. He exhibits no distension and no mass. There is no tenderness. There is no rebound and no guarding.  Musculoskeletal: Normal range of motion. He exhibits no edema or tenderness.  Lymphadenopathy:    He has no cervical adenopathy.  Neurological: He is alert and oriented to person, place, and time. He has normal reflexes. No cranial nerve deficit. He exhibits normal muscle tone. Coordination normal.  Skin: Skin is warm and dry.  Psychiatric: He has a normal mood and affect. His behavior is normal. Judgment and thought content normal.  Assessment & Plan:   Problem List Items Addressed This Visit      Other   Routine general medical examination at a health care facility - Primary    1) Anticipatory Guidance: Discussed importance of wearing a seatbelt while driving and not texting while driving; changing batteries in smoke detector at least once annually; wearing suntan lotion when outside; eating a balanced and moderate diet; getting physical activity at least 30 minutes per day.  2) Immunizations / Screenings / Labs:  Declines Zostavax. All other immunizations are up to date per recommendations. Due for a colonoscopy - referral to GI placed. EKG reveals normal sinus rhythm.  All other screenings are up to date per recommendations. Obtain CBC, CMET, PSA, Lipid profile and TSH.   Overall well exam. Patient has cardiovascular risk factors including hyperlipidemia and type 2 diabetes. Hyperlipidemia is currently controlled with simvastatin.  Obtain complete metabolic panel to check liver function and lipid profile to check his current cholesterol. Type 2 diabetes is currently being managed by endocrinology and gradually improving. Continue current dosages of Amaryl, Janumet, and Invokana. Continue other healthy lifestyle behaviors. Follow-up office visit pending lab work; follow-up prevention exam in one year.       Relevant Orders   Ambulatory referral to Gastroenterology   Comprehensive metabolic panel   CBC   TSH   PSA   Lipid panel   EKG 12-Lead (Completed)

## 2014-12-14 NOTE — Telephone Encounter (Signed)
LVM for pt to call back.

## 2014-12-14 NOTE — Telephone Encounter (Signed)
Please inform patient that his kidney function, liver function, electrolytes, white/red blood cells, prostate, cholesterol and thyroid are all within the normal limits. Please continue to take medications as prescribed and follow up in about 6 months or sooner if needed.

## 2014-12-14 NOTE — Assessment & Plan Note (Addendum)
1) Anticipatory Guidance: Discussed importance of wearing a seatbelt while driving and not texting while driving; changing batteries in smoke detector at least once annually; wearing suntan lotion when outside; eating a balanced and moderate diet; getting physical activity at least 30 minutes per day.  2) Immunizations / Screenings / Labs:  Declines Zostavax. All other immunizations are up to date per recommendations. Due for a colonoscopy - referral to GI placed. EKG reveals normal sinus rhythm.  All other screenings are up to date per recommendations. Obtain CBC, CMET, PSA, Lipid profile and TSH.   Overall well exam. Patient has cardiovascular risk factors including hyperlipidemia and type 2 diabetes. Hyperlipidemia is currently controlled with simvastatin. Obtain complete metabolic panel to check liver function and lipid profile to check his current cholesterol. Type 2 diabetes is currently being managed by endocrinology and gradually improving. Continue current dosages of Amaryl, Janumet, and Invokana. Continue other healthy lifestyle behaviors. Follow-up office visit pending lab work; follow-up prevention exam in one year.

## 2014-12-14 NOTE — Progress Notes (Signed)
Pre visit review using our clinic review tool, if applicable. No additional management support is needed unless otherwise documented below in the visit note. 

## 2014-12-19 ENCOUNTER — Other Ambulatory Visit: Payer: Self-pay | Admitting: Endocrinology

## 2014-12-22 ENCOUNTER — Other Ambulatory Visit: Payer: Self-pay | Admitting: Endocrinology

## 2015-01-06 ENCOUNTER — Ambulatory Visit: Payer: 59 | Admitting: Family Medicine

## 2015-01-13 ENCOUNTER — Encounter: Payer: Self-pay | Admitting: Internal Medicine

## 2015-01-30 IMAGING — CR DG CHEST 2V
2 series · 2 of 2 positions shown · non-contrast
Comparison: April 29, 2012.

CLINICAL DATA: Hypertension.

EXAM:
CHEST  2 VIEW

[view not recorded (1 of 2)]
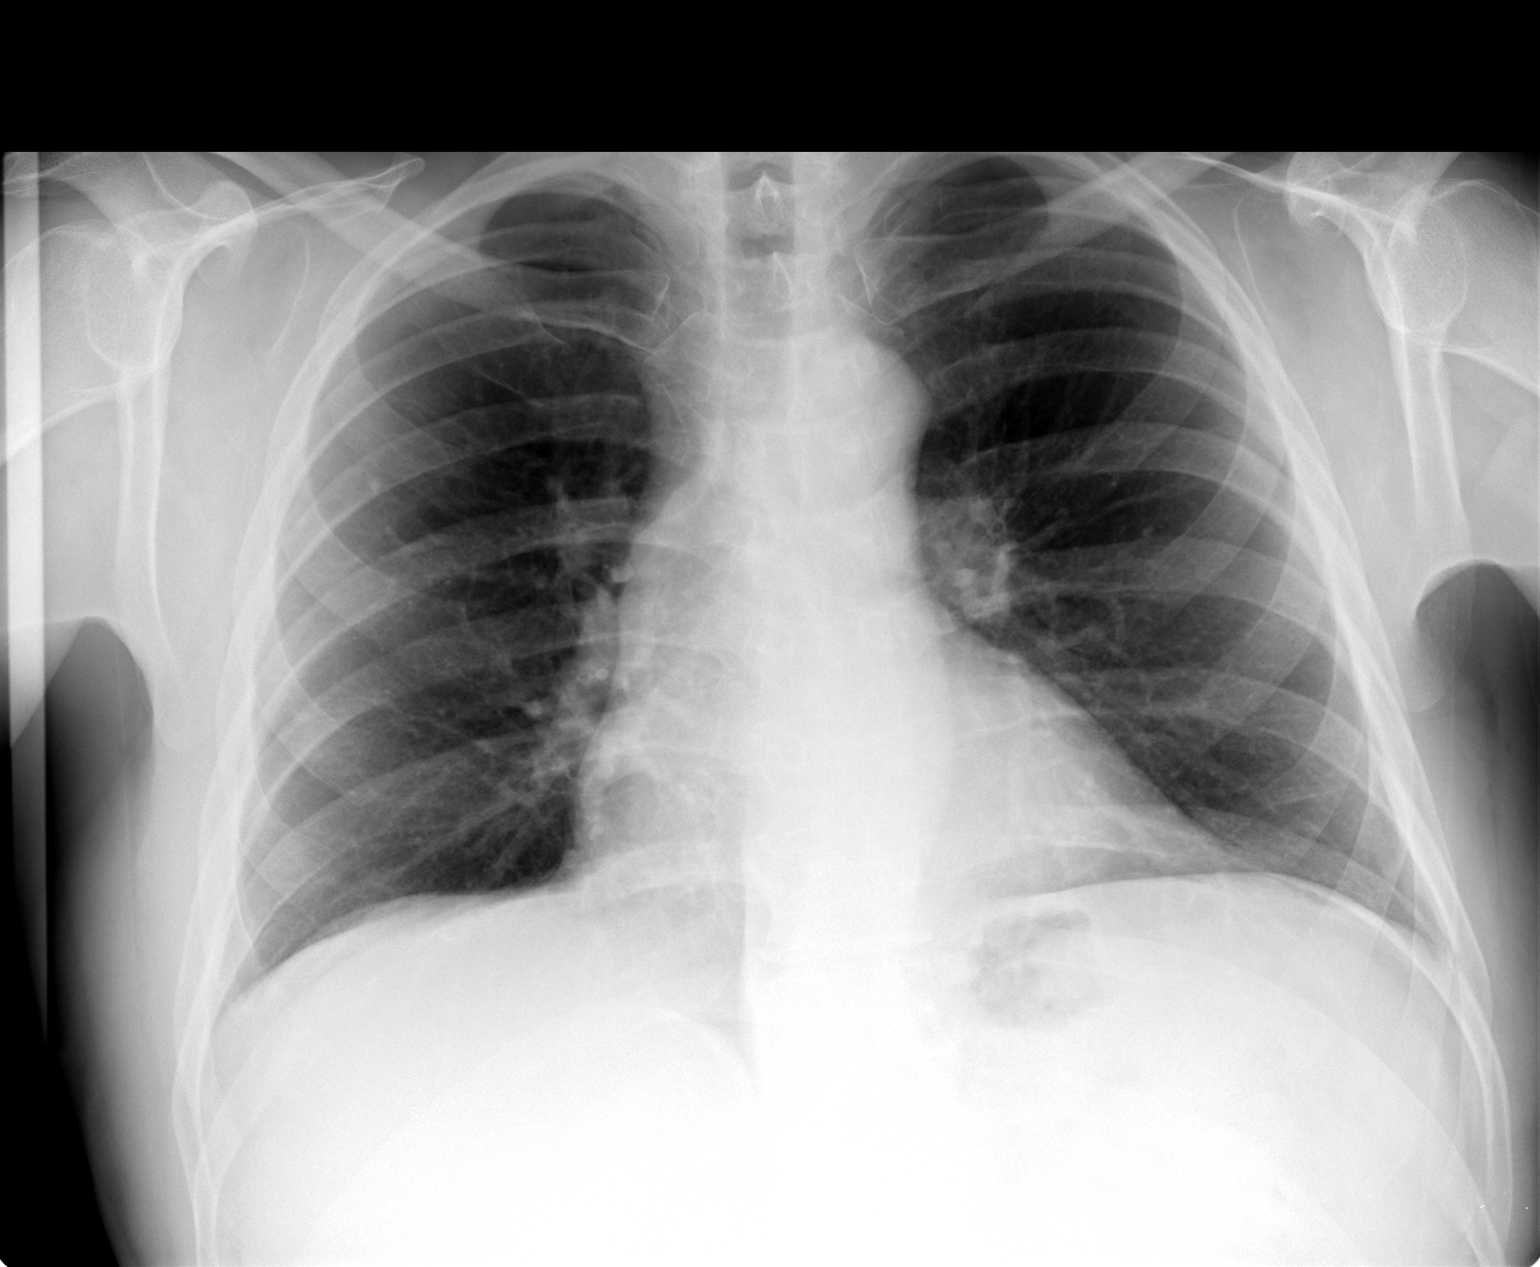

[view not recorded (2 of 2)]
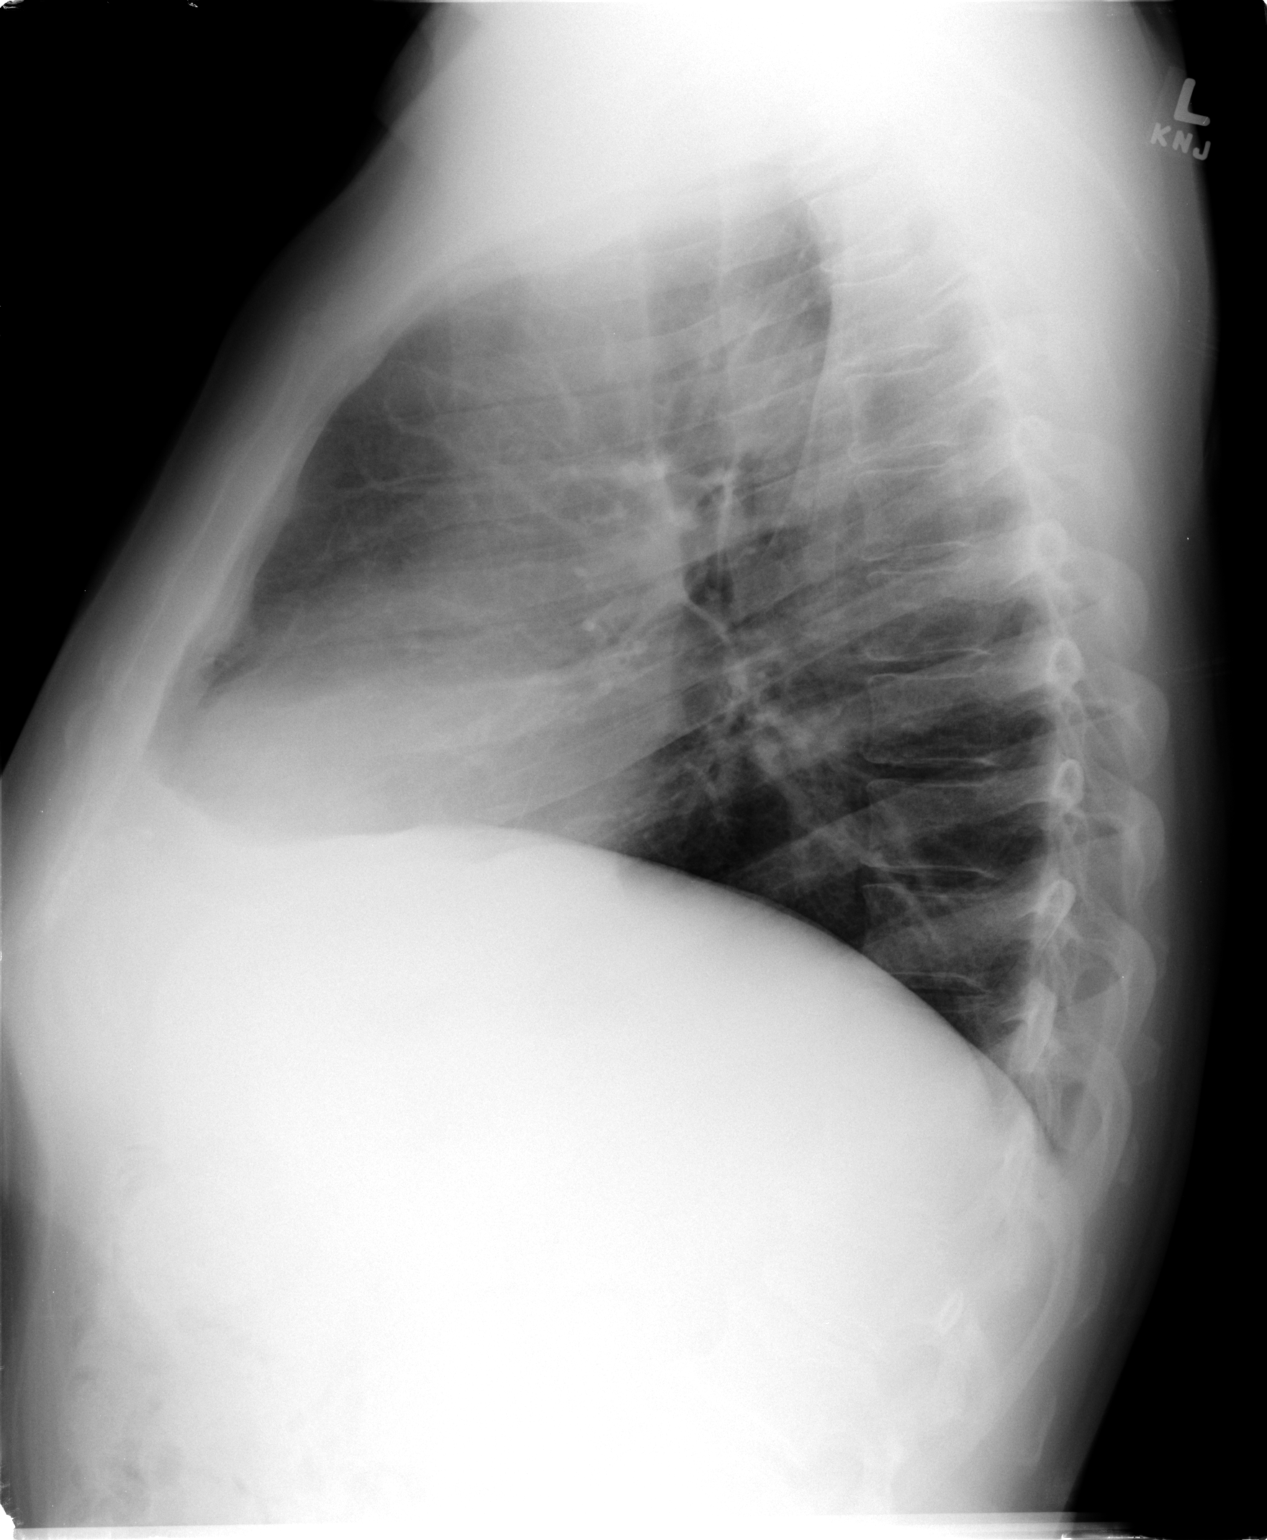

[2 of 2 positions shown; findings below may reference images not displayed]

FINDINGS: The heart size and mediastinal contours are within normal limits.
Both lungs are clear. No pleural effusion or pneumothorax is noted.
The visualized skeletal structures are unremarkable.
IMPRESSION: No active cardiopulmonary disease.

## 2015-02-17 ENCOUNTER — Other Ambulatory Visit (INDEPENDENT_AMBULATORY_CARE_PROVIDER_SITE_OTHER): Payer: 59

## 2015-02-17 DIAGNOSIS — E119 Type 2 diabetes mellitus without complications: Secondary | ICD-10-CM | POA: Diagnosis not present

## 2015-02-17 LAB — COMPREHENSIVE METABOLIC PANEL
ALK PHOS: 45 U/L (ref 39–117)
ALT: 16 U/L (ref 0–53)
AST: 17 U/L (ref 0–37)
Albumin: 4.1 g/dL (ref 3.5–5.2)
BUN: 19 mg/dL (ref 6–23)
CO2: 27 mEq/L (ref 19–32)
CREATININE: 0.9 mg/dL (ref 0.40–1.50)
Calcium: 9.2 mg/dL (ref 8.4–10.5)
Chloride: 104 mEq/L (ref 96–112)
GFR: 90.27 mL/min (ref >60.00)
Glucose, Bld: 153 mg/dL — ABNORMAL HIGH (ref 70–99)
Potassium: 3.9 mEq/L (ref 3.5–5.1)
Sodium: 139 mEq/L (ref 135–145)
Total Bilirubin: 0.5 mg/dL (ref 0.2–1.2)
Total Protein: 6.8 g/dL (ref 6.0–8.3)

## 2015-02-17 LAB — HEMOGLOBIN A1C: Hgb A1c MFr Bld: 7.6 % — ABNORMAL HIGH (ref 4.6–6.5)

## 2015-02-18 ENCOUNTER — Other Ambulatory Visit: Payer: Self-pay | Admitting: Endocrinology

## 2015-02-22 ENCOUNTER — Ambulatory Visit (INDEPENDENT_AMBULATORY_CARE_PROVIDER_SITE_OTHER): Payer: 59 | Admitting: Endocrinology

## 2015-02-22 ENCOUNTER — Encounter: Payer: Self-pay | Admitting: Endocrinology

## 2015-02-22 VITALS — BP 124/70 | HR 70 | Temp 98.1°F | Resp 16 | Ht 68.0 in | Wt 164.4 lb

## 2015-02-22 DIAGNOSIS — E1165 Type 2 diabetes mellitus with hyperglycemia: Secondary | ICD-10-CM | POA: Diagnosis not present

## 2015-02-22 DIAGNOSIS — IMO0002 Reserved for concepts with insufficient information to code with codable children: Secondary | ICD-10-CM

## 2015-02-22 DIAGNOSIS — E785 Hyperlipidemia, unspecified: Secondary | ICD-10-CM | POA: Diagnosis not present

## 2015-02-22 NOTE — Patient Instructions (Signed)
Check blood sugars on waking up .. 3 .. times a week Also check blood sugars about 2 hours after a meal and do this after different meals by rotation Recommended blood sugar levels on waking up is 90-130 and about 2 hours after meal is 140-180 Please bring blood sugar monitor to each visit.  Regular exercise

## 2015-02-22 NOTE — Progress Notes (Signed)
Patient ID: [name] Little, male   DOB: 1950/10/04, 64 y.o.   MRN: [id number]   Reason for Appointment : Followup for Type 2 Diabetes   History of Present Illness          Diagnosis: Type 2 diabetes mellitus, date of diagnosis: 1994   Past history: He has been treated for his diabetes with Glucovance for many years  with variable control Also appears to have had minimal diabetes education and has been monitoring his blood sugars only sporadically Review of his A1c shows he has had levels of at least 8% since 2009 and high levels in the last 2 years. On his regimen of glyburide/metformin and low dose Januvia his blood sugars were fluctuating significantly ranging from 80-200+ and also occasional symptoms of hypoglycemia His glyburide was stopped because of significant variability in blood sugars and tendency to hypoglycemia.  Also his Januvia was increased from 25 mg daily therapeutic dose of 100 mg. He was started on low-dose Amaryl on his  visit in 3/15  Recent history:   Oral hypoglycemic drugs the patient is taking are: Janumet XR, Invokana, Amaryl 0.5 mg       He tends to have relatively high A1c despite fairly good blood sugar readings at home. Blood sugars are again about the same as well as A1c However again he is still checking blood sugars mostly in the morning Fasting readings appear to be high normal and somewhat variable Has not done some more readings after meals recently including after supper and these are generally fairly good Only once had high reading of 277 which he cannot explain Has been taking his Invokana before breakfast as directed He has been generally less active recently because of the hot weather    Glucose monitoring:  less than 1 times daily        Glucometer: One Touch.      Blood Glucose readings from download show   Mean values apply above for all meters except median for One Touch  PRE-MEAL Fasting Lunch Dinner Bedtime Overall  Glucose range:   102-156       Mean/median: 126    126   POST-MEAL PC Breakfast PC Lunch PC Dinner  Glucose range:  174   123-177   82-277   Mean/median:    105    Hypoglycemia frequency: Rare, none recently Self-care: The diet that the patient has been following is: tries to limit carbohydrates and portions.  avoiding drinks with sugar usually    Physical activity: exercise: yard work       [facility] visit: Most recent: 12 years ago            Side effects from medications have been none  Compliance with the medical regimen:  fair Retinal exam: Most recent: 2013.     Wt Readings from Last 3 Encounters:  02/22/15 164 lb 6.4 oz (74.571 kg)  12/14/14 162 lb (73.483 kg)  12/09/14 166 lb (75.297 kg)   Lab Results  Component Value Date   HGBA1C 7.6* 02/17/2015   HGBA1C 7.5* 10/18/2014   HGBA1C 7.9* 07/15/2014   Lab Results  Component Value Date   MICROALBUR <0.7 11/19/2014   LDLCALC 50 12/14/2014   CREATININE 0.90 02/17/2015       Medication List       This list is accurate as of: 02/22/15 10:01 AM.  Always use your most recent med list.  aspirin 81 MG tablet  Take 81 mg by mouth at bedtime.     glimepiride 1 MG tablet  Commonly known as:  AMARYL  TAKE 1 TABLET (1 MG TOTAL) BY MOUTH DAILY WITH BREAKFAST.     glucose blood test strip  Commonly known as:  ONE TOUCH ULTRA TEST  Use to check once daily dx code E11.65     ibuprofen 200 MG tablet  Commonly known as:  ADVIL,MOTRIN  3 tabs w/ meals every 8 hours as needed     INVOKANA 300 MG Tabs tablet  Generic drug:  canagliflozin  TAKE 1 TABLET BY MOUTH EVERY DAY     JANUMET XR 50-1000 MG Tb24  Generic drug:  SitaGLIPtin-MetFORMIN HCl  TAKE 2 TABLETS ONCE DAILY     latanoprost 0.005 % ophthalmic solution  Commonly known as:  XALATAN     mometasone 50 MCG/ACT nasal spray  Commonly known as:  NASONEX  Place 2 sprays into the nose daily. 2 puffs twice daily     niacin 500 MG CR tablet  Commonly known as:   NIASPAN  TAKE 1 TABLET DAILY     ONE TOUCH ULTRA SYSTEM KIT W/DEVICE Kit  Use to test once daily     ONETOUCH DELICA LANCETS FINE Misc  USE TO TEST ONCE A DAY     oxymetazoline 0.05 % nasal spray  Commonly known as:  AFRIN  2 puffs twice daily as needed for 5 days before nasonex as needed     simvastatin 20 MG tablet  Commonly known as:  ZOCOR  TAKE 1 TABLET DAILY        Allergies: No Known Allergies  Past Medical History  Diagnosis Date  . Actinic keratosis   . Other and unspecified hyperlipidemia   . Obesity, unspecified   . Type II or unspecified type diabetes mellitus without mention of complication, not stated as uncontrolled   . Unspecified essential hypertension     Past Surgical History  Procedure Laterality Date  . Rotator cuff repair      Family History  Problem Relation Age of Onset  . COPD Father   . Diabetes Mother   . Cancer Brother   . Thyroid cancer Brother   . Colon cancer Neg Hx     Social History:  reports that he quit smoking about 16 years ago. His smoking use included Cigars. He has never used smokeless tobacco. He reports that he does not drink alcohol or use illicit drugs.    Review of Systems       Lipids:  he has had diabetic dyslipidemia and was taking Simcor low-dose for quite some time with adequate response However now he is taking niacin and Zocor separately  His HDL is  relatively low but LDL is excellent  Lab Results  Component Value Date   CHOL 103 12/14/2014   HDL 41.50 12/14/2014   LDLCALC 50 12/14/2014   TRIG 60.0 12/14/2014   CHOLHDL 2 12/14/2014    The blood pressure has been  well controlled with Benicar,  taking only 10 mg  Has glaucoma, followed every 3 months        He has not had Zostavax, apparently not covered by his insurance  Physical Examination:  BP 124/70 mmHg  Pulse 70  Temp(Src) 98.1 F (36.7 C)  Resp 16  Ht _0  (1.727 m)  Wt 164 lb 6.4 oz (74.571 kg)  BMI 25.00 kg/m2  SpO2 98%  No  ankle edema  ASSESSMENT:  Diabetes type 2 without obesity  His blood sugars are fairly well controlled again as judged by his home blood sugars Again his A1c is higher than expected at 7.6 and unchanged  He has only sporadic high readings including in the morning but not consistently and will keep him on low dose Amaryl for now along with his Invokana and Janumet Encouraged him to start being more active especially with walking or other aerobic activity     Patient Instructions  Check blood sugars on waking up .. 3 .. times a week Also check blood sugars about 2 hours after a meal and do this after different meals by rotation Recommended blood sugar levels on waking up is 90-130 and about 2 hours after meal is 140-180 Please bring blood sugar monitor to each visit.  Regular exercise       Natalie Leclaire 02/22/2015, 10:01 AM

## 2015-02-23 ENCOUNTER — Other Ambulatory Visit: Payer: Self-pay | Admitting: Endocrinology

## 2015-04-17 ENCOUNTER — Other Ambulatory Visit: Payer: Self-pay | Admitting: Endocrinology

## 2015-05-03 ENCOUNTER — Ambulatory Visit: Payer: 59

## 2015-05-06 ENCOUNTER — Ambulatory Visit (INDEPENDENT_AMBULATORY_CARE_PROVIDER_SITE_OTHER): Payer: Commercial Managed Care - HMO

## 2015-05-06 DIAGNOSIS — Z23 Encounter for immunization: Secondary | ICD-10-CM | POA: Diagnosis not present

## 2015-05-17 ENCOUNTER — Other Ambulatory Visit: Payer: Self-pay | Admitting: Endocrinology

## 2015-06-20 ENCOUNTER — Other Ambulatory Visit (INDEPENDENT_AMBULATORY_CARE_PROVIDER_SITE_OTHER): Payer: Commercial Managed Care - HMO

## 2015-06-20 DIAGNOSIS — E1165 Type 2 diabetes mellitus with hyperglycemia: Secondary | ICD-10-CM

## 2015-06-20 DIAGNOSIS — IMO0002 Reserved for concepts with insufficient information to code with codable children: Secondary | ICD-10-CM

## 2015-06-20 LAB — BASIC METABOLIC PANEL
BUN: 18 mg/dL (ref 6–23)
CALCIUM: 9.4 mg/dL (ref 8.4–10.5)
CHLORIDE: 100 meq/L (ref 96–112)
CO2: 28 meq/L (ref 19–32)
CREATININE: 1.01 mg/dL (ref 0.40–1.50)
GFR: 78.94 mL/min (ref >60.00)
GLUCOSE: 171 mg/dL — AB (ref 70–99)
Potassium: 4.3 mEq/L (ref 3.5–5.1)
Sodium: 136 mEq/L (ref 135–145)

## 2015-06-20 LAB — HEMOGLOBIN A1C: HEMOGLOBIN A1C: 7.7 % — AB (ref 4.6–6.5)

## 2015-06-24 ENCOUNTER — Encounter: Payer: Self-pay | Admitting: Endocrinology

## 2015-06-24 ENCOUNTER — Ambulatory Visit (INDEPENDENT_AMBULATORY_CARE_PROVIDER_SITE_OTHER): Payer: Commercial Managed Care - HMO | Admitting: Endocrinology

## 2015-06-24 VITALS — BP 136/68 | HR 69 | Temp 98.2°F | Resp 14 | Ht 68.0 in | Wt 168.8 lb

## 2015-06-24 DIAGNOSIS — E1165 Type 2 diabetes mellitus with hyperglycemia: Secondary | ICD-10-CM

## 2015-06-24 NOTE — Patient Instructions (Signed)
Exercise daily  Check blood sugars on waking up   times a week Also check blood sugars about 2 hours after a meal and do this after different meals by rotation  Recommended blood sugar levels on waking up is 90-130 and about 2 hours after meal is 130-160  Please bring your blood sugar monitor to each visit, thank you  Add 1/2 Glimeperide at bedtime

## 2015-06-24 NOTE — Progress Notes (Signed)
Patient ID: [name] Little, male   DOB: 1951/06/09, 64 y.o.   MRN: [id number]   Reason for Appointment : Followup for Type 2 Diabetes   History of Present Illness          Diagnosis: Type 2 diabetes mellitus, date of diagnosis: 1994   Past history: He has been treated for his diabetes with Glucovance for many years  with variable control Also appears to have had minimal diabetes education and has been monitoring his blood sugars only sporadically Review of his A1c shows he has had levels of at least 8% since 2009 and high levels in the last 2 years. On his regimen of glyburide/metformin and low dose Januvia his blood sugars were fluctuating significantly ranging from 80-200+ and also occasional symptoms of hypoglycemia His glyburide was stopped because of significant variability in blood sugars and tendency to hypoglycemia.  Also his Januvia was increased from 25 mg daily therapeutic dose of 100 mg. He was started on low-dose Amaryl on his  visit in 3/15  Recent history:   Oral hypoglycemic drugs the patient is taking are: Janumet XR, Invokana, Amaryl 0.5 mg in a.m.       He tends to have relatively high A1c despite fairly good blood sugar readings at home. A1c is about the same at 7.7 today  Current blood sugar patterns, management and problems identified:  He appears to have high fasting readings which he thinks have been occurring for last 3-4 weeks  His lab glucose was 177 even though at home it was 133 same morning  He thinks his fasting readings are higher because of eating later at night  He has not been as active and is slowly gaining weight  Currently not checking readings after supper although did not have consistently high readings previously Has been taking his Invokana before breakfast as directed    Glucose monitoring:  less than 1 times daily        Glucometer: One Touch ultra 2 .      Blood Glucose readings from download show  Mean values apply above  for all meters except median for One Touch  PRE-MEAL Fasting Lunch Dinner Bedtime Overall  Glucose range:  133-175   97, 136   92, 103     Mean/median:  154      139     Hypoglycemia frequency: Rare, none recently Self-care: The diet that the patient has been following is: tries to limit carbohydrates and portions.   Avoiding drinks with sugar     Physical activity: exercise: none       Dietician visit: Most recent: 12 years ago            Side effects from medications have been none  Compliance with the medical regimen:  fair Retinal exam: Most recent: 2013.     Wt Readings from Last 3 Encounters:  06/24/15 168 lb 12.8 oz (76.567 kg)  02/22/15 164 lb 6.4 oz (74.571 kg)  12/14/14 162 lb (73.483 kg)   Lab Results  Component Value Date   HGBA1C 7.7* 06/20/2015   HGBA1C 7.6* 02/17/2015   HGBA1C 7.5* 10/18/2014   Lab Results  Component Value Date   MICROALBUR <0.7 11/19/2014   LDLCALC 50 12/14/2014   CREATININE 1.01 06/20/2015       Medication List       This list is accurate as of: 06/24/15  9:23 AM.  Always use your most recent med list.  aspirin 81 MG tablet  Take 81 mg by mouth at bedtime.     glimepiride 1 MG tablet  Commonly known as:  AMARYL  TAKE 1 TABLET (1 MG TOTAL) BY MOUTH DAILY WITH BREAKFAST.     glimepiride 1 MG tablet  Commonly known as:  AMARYL  TAKE 1 TABLET (1 MG TOTAL) BY MOUTH DAILY WITH BREAKFAST.     ibuprofen 200 MG tablet  Commonly known as:  ADVIL,MOTRIN  3 tabs w/ meals every 8 hours as needed     INVOKANA 300 MG Tabs tablet  Generic drug:  canagliflozin  TAKE 1 TABLET BY MOUTH EVERY DAY     JANUMET XR 50-1000 MG Tb24  Generic drug:  SitaGLIPtin-MetFORMIN HCl  TAKE 2 TABLETS ONCE DAILY     JANUMET XR 50-1000 MG Tb24  Generic drug:  SitaGLIPtin-MetFORMIN HCl  TAKE 2 TABLETS ONCE DAILY     JANUMET XR 50-1000 MG Tb24  Generic drug:  SitaGLIPtin-MetFORMIN HCl  TAKE 2 TABLETS ONCE DAILY     latanoprost 0.005  % ophthalmic solution  Commonly known as:  XALATAN     mometasone 50 MCG/ACT nasal spray  Commonly known as:  NASONEX  Place 2 sprays into the nose daily. 2 puffs twice daily     niacin 500 MG CR tablet  Commonly known as:  NIASPAN  TAKE 1 TABLET DAILY     ONE TOUCH ULTRA SYSTEM KIT W/DEVICE Kit  Use to test once daily     ONE TOUCH ULTRA TEST test strip  Generic drug:  glucose blood  USE TO CHECK ONCE DAILY     ONETOUCH DELICA LANCETS FINE Misc  USE TO TEST ONCE A DAY     oxymetazoline 0.05 % nasal spray  Commonly known as:  AFRIN  2 puffs twice daily as needed for 5 days before nasonex as needed     simvastatin 20 MG tablet  Commonly known as:  ZOCOR  TAKE 1 TABLET DAILY        Allergies: No Known Allergies  Past Medical History  Diagnosis Date  . Actinic keratosis   . Other and unspecified hyperlipidemia   . Obesity, unspecified   . Type II or unspecified type diabetes mellitus without mention of complication, not stated as uncontrolled   . Unspecified essential hypertension     Past Surgical History  Procedure Laterality Date  . Rotator cuff repair      Family History  Problem Relation Age of Onset  . COPD Father   . Diabetes Mother   . Cancer Brother   . Thyroid cancer Brother   . Colon cancer Neg Hx     Social History:  reports that he quit smoking about 16 years ago. His smoking use included Cigars. He has never used smokeless tobacco. He reports that he does not drink alcohol or use illicit drugs.    Review of Systems       Lipids:  he has had diabetic dyslipidemia  Currently taking niacin and Zocor separately  His HDL is  relatively low but LDL is excellent  Lab Results  Component Value Date   CHOL 103 12/14/2014   HDL 41.50 12/14/2014   LDLCALC 50 12/14/2014   TRIG 60.0 12/14/2014   CHOLHDL 2 12/14/2014    The blood pressure has been  well controlled, previously on Benicar, now improved with Invokana   Has glaucoma, followed  every 3 months        He has not had Zostavax,  apparently not covered by his insurance  Physical Examination:  BP 136/68 mmHg  Pulse 69  Temp(Src) 98.2 F (36.8 C)  Resp 14  Ht '5\' 8"'  (1.727 m)  Wt 168 lb 12.8 oz (76.567 kg)  BMI 25.67 kg/m2  SpO2 96%  No ankle edema  Diabetic foot exam shows normal monofilament sensation in the toes and plantar surfaces, no skin lesions or ulcers on the feet and normal pedal pulses    ASSESSMENT:  Diabetes type 2 without obesity  His blood sugars are fairly well controlled but his fasting blood sugars are mostly higher now Taking Amaryl low-dose in the morning only which controls blood sugars during the day. However he does not check blood sugars after supper Discussed timing of glucose monitoring and blood sugar targets He will be given a new glucose monitor today also has not clear if his readings are accurate  He will start adding another half tablet of Amaryl at bedtime Start consistent exercise, he will use his treadmill or exercise bike    HYPERTENSION: Well controlled  Patient Instructions  Exercise daily  Check blood sugars on waking up   times a week Also check blood sugars about 2 hours after a meal and do this after different meals by rotation  Recommended blood sugar levels on waking up is 90-130 and about 2 hours after meal is 130-160  Please bring your blood sugar monitor to each visit, thank you  Add 1/2 Glimeperide at bedtime      [name] Little 06/24/2015, 9:23 AM

## 2015-07-17 ENCOUNTER — Other Ambulatory Visit: Payer: Self-pay | Admitting: Endocrinology

## 2015-07-21 ENCOUNTER — Other Ambulatory Visit: Payer: Self-pay

## 2015-07-21 MED ORDER — NIACIN ER (ANTIHYPERLIPIDEMIC) 500 MG PO TBCR: 500.0000 mg | EXTENDED_RELEASE_TABLET | Freq: Every day | ORAL | Status: DC

## 2015-08-16 ENCOUNTER — Other Ambulatory Visit: Payer: Self-pay | Admitting: Endocrinology

## 2015-09-19 ENCOUNTER — Other Ambulatory Visit (INDEPENDENT_AMBULATORY_CARE_PROVIDER_SITE_OTHER): Payer: Commercial Managed Care - HMO

## 2015-09-19 DIAGNOSIS — E1165 Type 2 diabetes mellitus with hyperglycemia: Secondary | ICD-10-CM

## 2015-09-19 LAB — COMPREHENSIVE METABOLIC PANEL
ALT: 15 U/L (ref 0–53)
AST: 16 U/L (ref 0–37)
Albumin: 4.3 g/dL (ref 3.5–5.2)
Alkaline Phosphatase: 48 U/L (ref 39–117)
BILIRUBIN TOTAL: 0.5 mg/dL (ref 0.2–1.2)
BUN: 22 mg/dL (ref 6–23)
CHLORIDE: 102 meq/L (ref 96–112)
CO2: 25 meq/L (ref 19–32)
Calcium: 9.7 mg/dL (ref 8.4–10.5)
Creatinine, Ser: 0.93 mg/dL (ref 0.40–1.50)
GFR: 86.75 mL/min (ref >60.00)
GLUCOSE: 136 mg/dL — AB (ref 70–99)
POTASSIUM: 4.3 meq/L (ref 3.5–5.1)
Sodium: 138 mEq/L (ref 135–145)
Total Protein: 7 g/dL (ref 6.0–8.3)

## 2015-09-19 LAB — HEMOGLOBIN A1C: Hgb A1c MFr Bld: 7.8 % — ABNORMAL HIGH (ref 4.6–6.5)

## 2015-09-22 ENCOUNTER — Ambulatory Visit (INDEPENDENT_AMBULATORY_CARE_PROVIDER_SITE_OTHER): Payer: Commercial Managed Care - HMO | Admitting: Endocrinology

## 2015-09-22 ENCOUNTER — Encounter: Payer: Self-pay | Admitting: Endocrinology

## 2015-09-22 VITALS — BP 126/68 | HR 66 | Temp 97.5°F | Resp 14 | Ht 68.0 in | Wt 170.8 lb

## 2015-09-22 DIAGNOSIS — Z23 Encounter for immunization: Secondary | ICD-10-CM | POA: Diagnosis not present

## 2015-09-22 NOTE — Progress Notes (Signed)
Patient ID: Unnamed [name] Little, male   DOB: June 03, 1951, 65 y.o.   MRN: [id number]   Reason for Appointment : Followup for Type 2 Diabetes   History of Present Illness          Diagnosis: Type 2 diabetes mellitus, date of diagnosis: 1994   Past history: He has been treated for his diabetes with Glucovance for many years  with variable control Also appears to have had minimal diabetes education and has been monitoring his blood sugars only sporadically Review of his A1c shows he has had levels of at least 8% since 2009 and high levels in the last 2 years. On his regimen of glyburide/metformin and low dose Januvia his blood sugars were fluctuating significantly ranging from 80-200+ and also occasional symptoms of hypoglycemia His glyburide was stopped because of significant variability in blood sugars and tendency to hypoglycemia.  Also his Januvia was increased from 25 mg daily therapeutic dose of 100 mg. He was started on low-dose Amaryl on his  visit in 3/15  Recent history:   Oral hypoglycemic drugs the patient is taking are: Janumet XR, Invokana, Amaryl 0.5 mg in a.m.       He tends to have relatively high A1c despite fairly good blood sugar readings at home. A1c is about the same at 7.8 today  Current blood sugar patterns, management and problems identified:  He has the highest readings in the mornings compared to the rest of the day  However FASTING readings are quite variable and not clear why, blood sugars are relatively better if they are lower the night before  His blood sugars are fairly good during the day with only sporadic high readings around lunch  Even though he is taking his Amaryl in the morning his low readings tend to be more around bedtime and occasionally at suppertime  He takes both his Janumet tablets in the evenings at suppertime  Appears to be steadily gaining weight. Has been taking his Invokana before breakfast as directed    Hypoglycemia  frequency: Rare, Once before supper and 1 at bedtime  Glucose monitoring:  less than 1 times daily        Glucometer: One Touch ultra 2 .      Blood Glucose readings from download show  Mean values apply above for all meters except median for One Touch  PRE-MEAL Fasting Lunch Dinner Bedtime Overall  Glucose range: 103-183  93-196  51-141  61-158    Mean/median: 140   95   118+/-36      Self-care: The diet that the patient has been following is: tries to limit carbohydrates and portions.   Avoiding drinks with sugar. Breakfast cereal or biscuit at 8     Physical activity: exercise: basketball       Dietician visit: Most recent: 12 years ago            Side effects from medications have been none  Compliance with the medical regimen:  fair Retinal exam: Most recent: 2013.     Wt Readings from Last 3 Encounters:  09/22/15 170 lb 12.8 oz (77.474 kg)  06/24/15 168 lb 12.8 oz (76.567 kg)  02/22/15 164 lb 6.4 oz (74.571 kg)   Lab Results  Component Value Date   HGBA1C 7.8* 09/19/2015   HGBA1C 7.7* 06/20/2015   HGBA1C 7.6* 02/17/2015   Lab Results  Component Value Date   MICROALBUR <0.7 11/19/2014   LDLCALC 50 12/14/2014   CREATININE 0.93 09/19/2015  Medication List       This list is accurate as of: 09/22/15  1:57 PM.  Always use your most recent med list.               aspirin 81 MG tablet  Take 81 mg by mouth at bedtime.     glimepiride 1 MG tablet  Commonly known as:  AMARYL  TAKE 1 TABLET (1 MG TOTAL) BY MOUTH DAILY WITH BREAKFAST.     ibuprofen 200 MG tablet  Commonly known as:  ADVIL,MOTRIN  3 tabs w/ meals every 8 hours as needed     INVOKANA 300 MG Tabs tablet  Generic drug:  canagliflozin  TAKE 1 TABLET BY MOUTH EVERY DAY     JANUMET XR 50-1000 MG Tb24  Generic drug:  SitaGLIPtin-MetFORMIN HCl  TAKE 2 TABLETS ONCE DAILY     latanoprost 0.005 % ophthalmic solution  Commonly known as:  XALATAN     mometasone 50 MCG/ACT nasal spray    Commonly known as:  NASONEX  Place 2 sprays into the nose daily. 2 puffs twice daily     niacin 500 MG CR tablet  Commonly known as:  NIASPAN  Take 1 tablet (500 mg total) by mouth daily.     ONE TOUCH ULTRA SYSTEM KIT w/Device Kit  Use to test once daily     ONE TOUCH ULTRA TEST test strip  Generic drug:  glucose blood  USE TO CHECK ONCE DAILY     ONETOUCH DELICA LANCETS FINE Misc  USE TO TEST ONCE A DAY     oxymetazoline 0.05 % nasal spray  Commonly known as:  AFRIN  2 puffs twice daily as needed for 5 days before nasonex as needed     simvastatin 20 MG tablet  Commonly known as:  ZOCOR  TAKE 1 TABLET DAILY        Allergies: No Known Allergies  Past Medical History  Diagnosis Date  . Actinic keratosis   . Other and unspecified hyperlipidemia   . Obesity, unspecified   . Type II or unspecified type diabetes mellitus without mention of complication, not stated as uncontrolled   . Unspecified essential hypertension     Past Surgical History  Procedure Laterality Date  . Rotator cuff repair      Family History  Problem Relation Age of Onset  . COPD Father   . Diabetes Mother   . Cancer Brother   . Thyroid cancer Brother   . Colon cancer Neg Hx     Social History:  reports that he quit smoking about 17 years ago. His smoking use included Cigars. He has never used smokeless tobacco. He reports that he does not drink alcohol or use illicit drugs.    Review of Systems       Lipids:  he has had diabetic dyslipidemia  Currently taking niacin and Zocor   His HDL is  relatively low but LDL is excellent  Lab Results  Component Value Date   CHOL 103 12/14/2014   HDL 41.50 12/14/2014   LDLCALC 50 12/14/2014   TRIG 60.0 12/14/2014   CHOLHDL 2 12/14/2014    The blood pressure has been  well controlled, Not on any medications except Invokana         He has not had Prevnar  Physical Examination:  BP 126/68 mmHg  Pulse 66  Temp(Src) 97.5 F (36.4 C)   Resp 14  Ht _0  (1.727 m)  Wt 170 lb 12.8  oz (77.474 kg)  BMI 25.98 kg/m2  SpO2 95%     ASSESSMENT:  Diabetes type 2 without obesity See history of present illness for detailed discussion of current diabetes management, blood sugar patterns and problems identified  Again his A1c is higher than expected for his blood sugars As discussed above he has relatively higher readings in the mornings but also tendency to better readings or even hypoglycemia the late afternoon or bedtime  For now will try to stop his Amaryl and split his Janumet to morning and evening doses Continue Invokana in the morning     HYPERTENSION: Well controlled  Prevnar given  Patient Instructions  Check blood sugars on waking up 3  times a week Also check blood sugars about 2 hours after a meal and do this after different meals by rotation  Recommended blood sugar levels on waking up is 90-130 and about 2 hours after meal is 130-160  Please bring your blood sugar monitor to each visit, thank you  Stop Glimeperide, call if sugars higher  Janumet 1 at [facility] and 1 at supper       [facility] 09/22/2015, 1:57 PM

## 2015-09-22 NOTE — Patient Instructions (Signed)
Check blood sugars on waking up 3  times a week Also check blood sugars about 2 hours after a meal and do this after different meals by rotation  Recommended blood sugar levels on waking up is 90-130 and about 2 hours after meal is 130-160  Please bring your blood sugar monitor to each visit, thank you  Stop Glimeperide, call if sugars higher  Janumet 1 at [facility] and 1 at supper

## 2015-11-07 ENCOUNTER — Other Ambulatory Visit: Payer: Self-pay | Admitting: *Deleted

## 2015-11-07 MED ORDER — GLIMEPIRIDE 1 MG PO TABS: ORAL_TABLET | ORAL | Status: DC

## 2015-11-10 ENCOUNTER — Other Ambulatory Visit: Payer: Self-pay | Admitting: Endocrinology

## 2015-12-02 ENCOUNTER — Other Ambulatory Visit: Payer: Self-pay | Admitting: Endocrinology

## 2015-12-10 ENCOUNTER — Other Ambulatory Visit: Payer: Self-pay | Admitting: Endocrinology

## 2015-12-20 ENCOUNTER — Other Ambulatory Visit (INDEPENDENT_AMBULATORY_CARE_PROVIDER_SITE_OTHER): Payer: Commercial Managed Care - HMO

## 2015-12-20 DIAGNOSIS — Z23 Encounter for immunization: Secondary | ICD-10-CM | POA: Diagnosis not present

## 2015-12-20 LAB — COMPREHENSIVE METABOLIC PANEL
ALBUMIN: 4.3 g/dL (ref 3.5–5.2)
ALK PHOS: 49 U/L (ref 39–117)
ALT: 15 U/L (ref 0–53)
AST: 15 U/L (ref 0–37)
BILIRUBIN TOTAL: 0.5 mg/dL (ref 0.2–1.2)
BUN: 21 mg/dL (ref 6–23)
CALCIUM: 9.3 mg/dL (ref 8.4–10.5)
CO2: 26 mEq/L (ref 19–32)
CREATININE: 0.91 mg/dL (ref 0.40–1.50)
Chloride: 102 mEq/L (ref 96–112)
GFR: 88.89 mL/min (ref >60.00)
Glucose, Bld: 149 mg/dL — ABNORMAL HIGH (ref 70–99)
Potassium: 3.9 mEq/L (ref 3.5–5.1)
Sodium: 137 mEq/L (ref 135–145)
TOTAL PROTEIN: 7 g/dL (ref 6.0–8.3)

## 2015-12-20 LAB — MICROALBUMIN / CREATININE URINE RATIO
Creatinine,U: 86.7 mg/dL
Microalb Creat Ratio: 0.8 mg/g (ref 0.0–30.0)

## 2015-12-20 LAB — LIPID PANEL
CHOL/HDL RATIO: 3
Cholesterol: 103 mg/dL (ref 0–200)
HDL: 39.8 mg/dL (ref >39.00)
LDL CALC: 51 mg/dL (ref 0–99)
NONHDL: 63.69
TRIGLYCERIDES: 62 mg/dL (ref 0.0–149.0)
VLDL: 12.4 mg/dL (ref 0.0–40.0)

## 2015-12-20 LAB — HEMOGLOBIN A1C: Hgb A1c MFr Bld: 7.8 % — ABNORMAL HIGH (ref 4.6–6.5)

## 2015-12-22 ENCOUNTER — Encounter: Payer: Self-pay | Admitting: Endocrinology

## 2015-12-22 ENCOUNTER — Ambulatory Visit (INDEPENDENT_AMBULATORY_CARE_PROVIDER_SITE_OTHER): Payer: Commercial Managed Care - HMO | Admitting: Endocrinology

## 2015-12-22 VITALS — BP 126/62 | HR 83 | Ht 68.0 in | Wt 161.0 lb

## 2015-12-22 DIAGNOSIS — E1165 Type 2 diabetes mellitus with hyperglycemia: Secondary | ICD-10-CM

## 2015-12-22 DIAGNOSIS — E785 Hyperlipidemia, unspecified: Secondary | ICD-10-CM | POA: Diagnosis not present

## 2015-12-22 NOTE — Progress Notes (Signed)
Patient ID: [name] Little, male   DOB: 10-25-1950, 65 y.o.   MRN: [id number]   Reason for Appointment : Followup for Type 2 Diabetes   History of Present Illness          Diagnosis: Type 2 diabetes mellitus, date of diagnosis: 1994   Past history: He has been treated for his diabetes with Glucovance for many years  with variable control Also appears to have had minimal diabetes education and has been monitoring his blood sugars only sporadically Review of his A1c shows he has had levels of at least 8% since 2009 and high levels in the last 2 years. On his regimen of glyburide/metformin and low dose Januvia his blood sugars were fluctuating significantly ranging from 80-200+ and also occasional symptoms of hypoglycemia His glyburide was stopped because of significant variability in blood sugars and tendency to hypoglycemia.  Also his Januvia was increased from 25 mg daily therapeutic dose of 100 mg. He was started on low-dose Amaryl on his  visit in 3/15  Recent history:   Oral hypoglycemic drugs the patient is taking are: Janumet XR, Invokana, Amaryl 0.5 mg in a.m.       He tends to have relatively high A1c despite fairly good blood sugar readings at home. A1c is about the same at 7.8   Current blood sugar patterns, management and problems identified:  He was told to stop the Amaryl in the morning since he was tending to have readings as low as 51 around suppertime but apparently has not done so.  However FASTING readings are relatively higher although he has not been checking very many in his lab glucose was 149 in the morning  He is checking his blood sugar somewhat less frequently and usually none after supper  Evening meal is variable and he has only one or 2 readings after eating  Highest blood sugar 222 after supper possibly from not watching his diet that day  Although he was gaining weight previously has lost 9 pounds since his last visit and not clear why  He  is trying to be generally active   His blood sugars are fairly good during the day with only sporadic high readings after breakfast  Sometimes may get a little more carbohydrate or fat with his morning meal  Has been taking his Invokana before breakfast as directed    Hypoglycemia frequency: Rare, Once before supper and 1 at bedtime  Glucose monitoring:  less than 1 times daily        Glucometer: One Touch ultra 2 .      Blood Glucose readings from download show  Mean values apply above for all meters except median for One Touch  PRE-MEAL Fasting Lunch Dinner Bedtime Overall  Glucose range: 134, 177  127-150  79-126  222    Mean/median:     132    Self-care: The diet that the patient has been following is: tries to limit carbohydrates and portions.   Avoiding drinks with sugar. Breakfast: cereal or biscuit at 8, dinner 6 pm     Physical activity: exercise: basketball       Dietician visit: Most recent: 12 years ago            Side effects from medications have been none  Compliance with the medical regimen:  fair Retinal exam: Most recent: 2013.     Wt Readings from Last 3 Encounters:  12/22/15 161 lb (73.029 kg)  09/22/15 170 lb 12.8 oz (  77.474 kg)  06/24/15 168 lb 12.8 oz (76.567 kg)   Lab Results  Component Value Date   HGBA1C 7.8* 12/20/2015   HGBA1C 7.8* 09/19/2015   HGBA1C 7.7* 06/20/2015   Lab Results  Component Value Date   MICROALBUR <0.7 12/20/2015   LDLCALC 51 12/20/2015   CREATININE 0.91 12/20/2015   Lab on 12/20/2015  Component Date Value Ref Range Status  . Hgb A1c MFr Bld 12/20/2015 7.8* 4.6 - 6.5 % Final   Glycemic Control Guidelines for People with Diabetes:Non Diabetic:  <6%Goal of Therapy: <7%Additional Action Suggested:  >8%   . Sodium 12/20/2015 137  135 - 145 mEq/L Final  . Potassium 12/20/2015 3.9  3.5 - 5.1 mEq/L Final  . Chloride 12/20/2015 102  96 - 112 mEq/L Final  . CO2 12/20/2015 26  19 - 32 mEq/L Final  . Glucose, Bld 12/20/2015  149* 70 - 99 mg/dL Final  . BUN 12/20/2015 21  6 - 23 mg/dL Final  . Creatinine, Ser 12/20/2015 0.91  0.40 - 1.50 mg/dL Final  . Total Bilirubin 12/20/2015 0.5  0.2 - 1.2 mg/dL Final  . Alkaline Phosphatase 12/20/2015 49  39 - 117 U/L Final  . AST 12/20/2015 15  0 - 37 U/L Final  . ALT 12/20/2015 15  0 - 53 U/L Final  . Total Protein 12/20/2015 7.0  6.0 - 8.3 g/dL Final  . Albumin 12/20/2015 4.3  3.5 - 5.2 g/dL Final  . Calcium 12/20/2015 9.3  8.4 - 10.5 mg/dL Final  . GFR 12/20/2015 88.89  >60.00 mL/min Final  . Microalb, Ur 12/20/2015 <0.7  0.0 - 1.9 mg/dL Final  . Creatinine,U 12/20/2015 86.7   Final  . Microalb Creat Ratio 12/20/2015 0.8  0.0 - 30.0 mg/g Final  . Cholesterol 12/20/2015 103  0 - 200 mg/dL Final   ATP III Classification       Desirable:  < 200 mg/dL               Borderline High:  200 - 239 mg/dL          High:  > = 240 mg/dL  . Triglycerides 12/20/2015 62.0  0.0 - 149.0 mg/dL Final   Normal:  <150 mg/dLBorderline High:  150 - 199 mg/dL  . HDL 12/20/2015 39.80  >39.00 mg/dL Final  . VLDL 12/20/2015 12.4  0.0 - 40.0 mg/dL Final  . LDL Cholesterol 12/20/2015 51  0 - 99 mg/dL Final  . Total CHOL/HDL Ratio 12/20/2015 3   Final                  Men          Women1/2 Average Risk     3.4          3.3Average Risk          5.0          4.42X Average Risk          9.6          7.13X Average Risk          15.0          11.0                      . NonHDL 12/20/2015 63.69   Final   NOTE:  Non-HDL goal should be 30 mg/dL higher than patient's LDL goal (i.e. LDL goal of < 70 mg/dL, would have non-HDL goal of < 100 mg/dL)  Medication List       This list is accurate as of: 12/22/15 12:18 PM.  Always use your most recent med list.               aspirin 81 MG tablet  Take 81 mg by mouth at bedtime.     glimepiride 1 MG tablet  Commonly known as:  AMARYL  TAKE 1 TABLET (1 MG TOTAL) BY MOUTH DAILY WITH BREAKFAST.     ibuprofen 200 MG tablet  Commonly known as:   ADVIL,MOTRIN  3 tabs w/ meals every 8 hours as needed     INVOKANA 300 MG Tabs tablet  Generic drug:  canagliflozin  TAKE 1 TABLET BY MOUTH EVERY DAY     JANUMET XR 50-1000 MG Tb24  Generic drug:  SitaGLIPtin-MetFORMIN HCl  TAKE 2 TABLETS ONCE DAILY     JANUMET XR 50-1000 MG Tb24  Generic drug:  SitaGLIPtin-MetFORMIN HCl  TAKE 2 TABLETS ONCE DAILY     latanoprost 0.005 % ophthalmic solution  Commonly known as:  XALATAN     mometasone 50 MCG/ACT nasal spray  Commonly known as:  NASONEX  Place 2 sprays into the nose daily. 2 puffs twice daily     niacin 500 MG CR tablet  Commonly known as:  NIASPAN  Take 1 tablet (500 mg total) by mouth daily.     ONE TOUCH ULTRA SYSTEM KIT w/Device Kit  Use to test once daily     ONE TOUCH ULTRA TEST test strip  Generic drug:  glucose blood  USE TO CHECK ONCE DAILY     ONETOUCH DELICA LANCETS FINE Misc  USE TO TEST ONCE A DAY     oxymetazoline 0.05 % nasal spray  Commonly known as:  AFRIN  PRN     simvastatin 20 MG tablet  Commonly known as:  ZOCOR  TAKE 1 TABLET DAILY        Allergies: No Known Allergies  Past Medical History  Diagnosis Date  . Actinic keratosis   . Other and unspecified hyperlipidemia   . Obesity, unspecified   . Type II or unspecified type diabetes mellitus without mention of complication, not stated as uncontrolled   . Unspecified essential hypertension     Past Surgical History  Procedure Laterality Date  . Rotator cuff repair      Family History  Problem Relation Age of Onset  . COPD Father   . Diabetes Mother   . Cancer Brother   . Thyroid cancer Brother   . Colon cancer Neg Hx     Social History:  reports that he quit smoking about 17 years ago. His smoking use included Cigars. He has never used smokeless tobacco. He reports that he does not drink alcohol or use illicit drugs.    Review of Systems       Lipids:  he has had diabetic dyslipidemia  Currently taking niacin and Zocor,  Previously on simcor Review of his HDL shows that it has not improved with taking niacin  Triglycerides normal, LDL is excellent  Lab Results  Component Value Date   CHOL 103 12/20/2015   HDL 39.80 12/20/2015   LDLCALC 51 12/20/2015   TRIG 62.0 12/20/2015   CHOLHDL 3 12/20/2015    The blood pressure has been  well controlled, Not on any medications except Invokana            Physical Examination:  BP 126/62 mmHg  Pulse 83  Ht '5\' 8"'  (1.727 m)  Wt 161 lb (73.029 kg)  BMI 24.49 kg/m2  SpO2 98%     ASSESSMENT:  Diabetes type 2 without obesity See history of present illness for analysis  of current diabetes management, blood sugar patterns and problems identified  Again his A1c is higher than expected for his blood sugars However has not checked any readings after supper except once and not clear when he is having high readings Fasting readings appear to be relatively higher His weight has come down significantly but this does not seem to have improved his control  For now will change his Amaryl to suppertime instead of morning Continue Invokana in the morning  LIPIDS: His LDL is excellent but his HDL tends to be persistently low Since he is only taking 500 mg niacin and not clear this has any benefits on his HDL will try to stop it.  This may also improve his glucose control    Patient Instructions  Stop Niacin   Check blood sugars on waking up 2-3  times a week Also check blood sugars about 2 hours after a meal and do this after different meals by rotation  Recommended blood sugar levels on waking up is 90-130 and about 2 hours after meal is 130-160  Please bring your blood sugar monitor to each visit, thank you       [facility] 12/22/2015, 12:18 PM

## 2015-12-22 NOTE — Patient Instructions (Signed)
Stop Niacin   Check blood sugars on waking up 2-3  times a week Also check blood sugars about 2 hours after a meal and do this after different meals by rotation  Recommended blood sugar levels on waking up is 90-130 and about 2 hours after meal is 130-160  Please bring your blood sugar monitor to each visit, thank you

## 2015-12-26 ENCOUNTER — Other Ambulatory Visit: Payer: Self-pay | Admitting: Pulmonary Disease

## 2016-01-12 ENCOUNTER — Telehealth: Payer: Self-pay | Admitting: Endocrinology

## 2016-01-12 MED ORDER — ONETOUCH DELICA LANCETS FINE MISC: Status: DC

## 2016-01-12 NOTE — Telephone Encounter (Signed)
PT needs his One Touch Delica Lancets refilled to [facility] in [place]

## 2016-01-12 NOTE — Telephone Encounter (Signed)
Rx submitted per pt's request.  

## 2016-03-05 ENCOUNTER — Other Ambulatory Visit: Payer: Self-pay | Admitting: Endocrinology

## 2016-03-06 ENCOUNTER — Other Ambulatory Visit: Payer: Self-pay | Admitting: Endocrinology

## 2016-03-06 DIAGNOSIS — L821 Other seborrheic keratosis: Secondary | ICD-10-CM | POA: Diagnosis not present

## 2016-03-06 DIAGNOSIS — L570 Actinic keratosis: Secondary | ICD-10-CM | POA: Diagnosis not present

## 2016-03-06 DIAGNOSIS — D225 Melanocytic nevi of trunk: Secondary | ICD-10-CM | POA: Diagnosis not present

## 2016-03-06 DIAGNOSIS — L814 Other melanin hyperpigmentation: Secondary | ICD-10-CM | POA: Diagnosis not present

## 2016-03-06 DIAGNOSIS — Z85828 Personal history of other malignant neoplasm of skin: Secondary | ICD-10-CM | POA: Diagnosis not present

## 2016-03-15 ENCOUNTER — Other Ambulatory Visit (INDEPENDENT_AMBULATORY_CARE_PROVIDER_SITE_OTHER): Payer: Medicare Other

## 2016-03-15 DIAGNOSIS — E1165 Type 2 diabetes mellitus with hyperglycemia: Secondary | ICD-10-CM | POA: Diagnosis not present

## 2016-03-15 DIAGNOSIS — E785 Hyperlipidemia, unspecified: Secondary | ICD-10-CM

## 2016-03-15 LAB — COMPREHENSIVE METABOLIC PANEL
ALBUMIN: 4.3 g/dL (ref 3.5–5.2)
ALT: 18 U/L (ref 0–53)
AST: 18 U/L (ref 0–37)
Alkaline Phosphatase: 49 U/L (ref 39–117)
BUN: 23 mg/dL (ref 6–23)
CHLORIDE: 103 meq/L (ref 96–112)
CO2: 29 meq/L (ref 19–32)
Calcium: 9.2 mg/dL (ref 8.4–10.5)
Creatinine, Ser: 0.93 mg/dL (ref 0.40–1.50)
GFR: 86.62 mL/min (ref >60.00)
Glucose, Bld: 126 mg/dL — ABNORMAL HIGH (ref 70–99)
POTASSIUM: 4.6 meq/L (ref 3.5–5.1)
SODIUM: 136 meq/L (ref 135–145)
Total Bilirubin: 0.4 mg/dL (ref 0.2–1.2)
Total Protein: 7.1 g/dL (ref 6.0–8.3)

## 2016-03-15 LAB — LIPID PANEL
CHOLESTEROL: 135 mg/dL (ref 0–200)
HDL: 42.8 mg/dL (ref >39.00)
LDL CALC: 78 mg/dL (ref 0–99)
NonHDL: 92.09
TRIGLYCERIDES: 68 mg/dL (ref 0.0–149.0)
Total CHOL/HDL Ratio: 3
VLDL: 13.6 mg/dL (ref 0.0–40.0)

## 2016-03-15 LAB — HEMOGLOBIN A1C: HEMOGLOBIN A1C: 7.3 % — AB (ref 4.6–6.5)

## 2016-03-20 ENCOUNTER — Ambulatory Visit: Payer: 59 | Admitting: Endocrinology

## 2016-04-10 ENCOUNTER — Other Ambulatory Visit: Payer: Self-pay | Admitting: Endocrinology

## 2016-04-17 DIAGNOSIS — H4720 Unspecified optic atrophy: Secondary | ICD-10-CM | POA: Diagnosis not present

## 2016-04-17 DIAGNOSIS — H401131 Primary open-angle glaucoma, bilateral, mild stage: Secondary | ICD-10-CM | POA: Diagnosis not present

## 2016-05-01 ENCOUNTER — Encounter: Payer: Self-pay | Admitting: Endocrinology

## 2016-05-01 ENCOUNTER — Ambulatory Visit (INDEPENDENT_AMBULATORY_CARE_PROVIDER_SITE_OTHER): Payer: Medicare Other | Admitting: Endocrinology

## 2016-05-01 VITALS — BP 118/64 | HR 67 | Ht 68.0 in | Wt 171.0 lb

## 2016-05-01 DIAGNOSIS — Z23 Encounter for immunization: Secondary | ICD-10-CM

## 2016-05-01 DIAGNOSIS — E1165 Type 2 diabetes mellitus with hyperglycemia: Secondary | ICD-10-CM | POA: Diagnosis not present

## 2016-05-01 NOTE — Progress Notes (Signed)
Patient ID: [name] Little, male   DOB: 09/27/1950, 65 y.o.   MRN: [id number]   Reason for Appointment : Followup for Type 2 Diabetes   History of Present Illness          Diagnosis: Type 2 diabetes mellitus, date of diagnosis: 1994   Recent history:   Oral hypoglycemic drugs the patient is taking are: Janumet XR, Invokana, Amaryl 0.5 mg at dinner       He tends to have relatively high A1c despite fairly good blood sugar readings at home. A1c is however better now at 7.3, previously 7.8 consistently  Current blood sugar patterns, management and problems identified:  He was switched from taking Amaryl in the morning to suppertime on his last visit   FASTING readings are still relatively high even though not consistent and he is not checking many readings at home  Blood sugars later in the day are being checked infrequently and are not high  He thinks he gets low sugars during the night but his only symptom is abdominal discomfort and the lowest blood sugar he has measured during the night is in the 70s, lowest reading the last month is 84  He forgets to do readings after meals  Although his weight was apparently down significantly last time he is back to baseline again  Has been taking his Invokana before breakfast as directed    Hypoglycemia frequency: Rare, Once before supper and 1 at bedtime  Glucose monitoring:  less than 1 times daily        Glucometer: One Touch ultra 2 .      Blood Glucose readings from download show  FASTING glucose range 125-171, nonfasting 84-138 with only sporadic readings at different times Overall MEDIAN 134  Self-care: The diet that the patient has been following is: tries to limit carbohydrates and portions.   Avoiding drinks with sugar. Breakfast: oatmeal or egg/toast  at 8, dinner 6 pm      Physical activity: exercise:On most evenings playing basketball.       Dietician visit: Most recent: 12 years ago            Side effects  from medications have been none  Compliance with the medical regimen:  good   Wt Readings from Last 3 Encounters:  05/01/16 171 lb (77.6 kg)  12/22/15 161 lb (73 kg)  09/22/15 170 lb 12.8 oz (77.5 kg)   Lab Results  Component Value Date   HGBA1C 7.3 (H) 03/15/2016   HGBA1C 7.8 (H) 12/20/2015   HGBA1C 7.8 (H) 09/19/2015   Lab Results  Component Value Date   MICROALBUR <0.7 12/20/2015   Gross 78 03/15/2016   CREATININE 0.93 03/15/2016    Past history: He has been treated for his diabetes with Glucovance for many years  with variable control Also appears to have had minimal diabetes education and has been monitoring his blood sugars only sporadically Review of his A1c shows he has had levels of at least 8% since 2009 and high levels in the last 2 years. On his regimen of glyburide/metformin and low dose Januvia his blood sugars were fluctuating significantly ranging from 80-200+ and also occasional symptoms of hypoglycemia His glyburide was stopped because of significant variability in blood sugars and tendency to hypoglycemia.  Also his Januvia was increased from 25 mg daily therapeutic dose of 100 mg. He was started on low-dose Amaryl on his  visit in 3/15    No visits with results within 1  Week(s) from this visit.  Latest known visit with results is:  Lab on 03/15/2016  Component Date Value Ref Range Status  . Hgb A1c MFr Bld 03/15/2016 7.3* 4.6 - 6.5 % Final  . Sodium 03/15/2016 136  135 - 145 mEq/L Final  . Potassium 03/15/2016 4.6  3.5 - 5.1 mEq/L Final  . Chloride 03/15/2016 103  96 - 112 mEq/L Final  . CO2 03/15/2016 29  19 - 32 mEq/L Final  . Glucose, Bld 03/15/2016 126* 70 - 99 mg/dL Final  . BUN 03/15/2016 23  6 - 23 mg/dL Final  . Creatinine, Ser 03/15/2016 0.93  0.40 - 1.50 mg/dL Final  . Total Bilirubin 03/15/2016 0.4  0.2 - 1.2 mg/dL Final  . Alkaline Phosphatase 03/15/2016 49  39 - 117 U/L Final  . AST 03/15/2016 18  0 - 37 U/L Final  . ALT 03/15/2016 18   0 - 53 U/L Final  . Total Protein 03/15/2016 7.1  6.0 - 8.3 g/dL Final  . Albumin 03/15/2016 4.3  3.5 - 5.2 g/dL Final  . Calcium 03/15/2016 9.2  8.4 - 10.5 mg/dL Final  . GFR 03/15/2016 86.62  >60.00 mL/min Final  . Cholesterol 03/15/2016 135  0 - 200 mg/dL Final  . Triglycerides 03/15/2016 68.0  0.0 - 149.0 mg/dL Final  . HDL 03/15/2016 42.80  >39.00 mg/dL Final  . VLDL 03/15/2016 13.6  0.0 - 40.0 mg/dL Final  . LDL Cholesterol 03/15/2016 78  0 - 99 mg/dL Final  . Total CHOL/HDL Ratio 03/15/2016 3   Final  . NonHDL 03/15/2016 92.09   Final       Medication List       Accurate as of 05/01/16 12:51 PM. Always use your most recent med list.          aspirin 81 MG tablet Take 81 mg by mouth at bedtime.   glimepiride 1 MG tablet Commonly known as:  AMARYL TAKE 1 TABLET (1 MG TOTAL) BY MOUTH DAILY WITH BREAKFAST.   ibuprofen 200 MG tablet Commonly known as:  ADVIL,MOTRIN 3 tabs w/ meals every 8 hours as needed   INVOKANA 300 MG Tabs tablet Generic drug:  canagliflozin TAKE 1 TABLET BY MOUTH EVERY DAY   JANUMET XR 50-1000 MG Tb24 Generic drug:  SitaGLIPtin-MetFORMIN HCl TAKE 2 TABLETS ONCE DAILY   JANUMET XR 50-1000 MG Tb24 Generic drug:  SitaGLIPtin-MetFORMIN HCl TAKE 2 TABLETS ONCE DAILY   latanoprost 0.005 % ophthalmic solution Commonly known as:  XALATAN   mometasone 50 MCG/ACT nasal spray Commonly known as:  NASONEX Place 2 sprays into the nose daily. 2 puffs twice daily   niacin 500 MG CR tablet Commonly known as:  NIASPAN Take 1 tablet (500 mg total) by mouth daily.   ONE TOUCH ULTRA SYSTEM KIT w/Device Kit Use to test once daily   ONE TOUCH ULTRA TEST test strip Generic drug:  glucose blood USE TO CHECK ONCE DAILY   ONETOUCH DELICA LANCETS FINE Misc USE TO TEST ONCE A DAY   oxymetazoline 0.05 % nasal spray Commonly known as:  AFRIN PRN   simvastatin 20 MG tablet Commonly known as:  ZOCOR TAKE 1 TABLET DAILY       Allergies: No Known  Allergies  Past Medical History:  Diagnosis Date  . Actinic keratosis   . Obesity, unspecified   . Other and unspecified hyperlipidemia   . Type II or unspecified type diabetes mellitus without mention of complication, not stated as uncontrolled   .  Unspecified essential hypertension     Past Surgical History:  Procedure Laterality Date  . ROTATOR CUFF REPAIR      Family History  Problem Relation Age of Onset  . COPD Father   . Diabetes Mother   . Cancer Brother   . Thyroid cancer Brother   . Colon cancer Neg Hx     Social History:  reports that he quit smoking about 17 years ago. His smoking use included Cigars. He has a 72.00 pack-year smoking history. He has never used smokeless tobacco. He reports that he does not drink alcohol or use drugs.    Review of Systems       Lipids:  he has had dyslipidemia  Currently taking  Zocor, Previously on simcor He was told to stop his niacin because of continued low HDL and lack of clinical evidence His LDL is still at target although relatively higher than before but HDL is improved   Lab Results  Component Value Date   CHOL 135 03/15/2016   HDL 42.80 03/15/2016   LDLCALC 78 03/15/2016   TRIG 68.0 03/15/2016   CHOLHDL 3 03/15/2016    The blood pressure has been Normal, Not on any medications except Invokana            Physical Examination:  BP 118/64   Pulse 67   Ht _0  (1.727 m)   Wt 171 lb (77.6 kg)   SpO2 97%   BMI 26.00 kg/m      ASSESSMENT:  Diabetes type 2 without obesity See history of present illness for details  of current diabetes management, blood sugar patterns and problems identified  A1c is better at 7.3 This is his despite having relatively high fasting readings at times at home Blood sugars are fairly close to normal the rest of the day but he is checking very sporadically No clear-cut hyperglycemia identified  Advised him to have a bedtime snack if blood sugars low normal or if eating  earlier in the evening Discussed that his abdominal discomfort during the night is unlikely to be from hyperglycemia  LIPIDS: Well controlled with Zocor alone  Influenza vaccine given  There are no Patient Instructions on file for this visit.    Aston Lawhorn 05/01/2016, 12:51 PM

## 2016-05-08 ENCOUNTER — Other Ambulatory Visit: Payer: Self-pay | Admitting: Endocrinology

## 2016-05-10 ENCOUNTER — Other Ambulatory Visit: Payer: Self-pay | Admitting: Endocrinology

## 2016-05-21 ENCOUNTER — Telehealth: Payer: Self-pay | Admitting: Endocrinology

## 2016-05-21 ENCOUNTER — Telehealth: Payer: Self-pay

## 2016-05-21 ENCOUNTER — Other Ambulatory Visit: Payer: Self-pay

## 2016-05-21 MED ORDER — ONETOUCH DELICA LANCETS FINE MISC: 2 refills | Status: DC

## 2016-05-21 MED ORDER — GLUCOSE BLOOD VI STRP: ORAL_STRIP | 5 refills | Status: DC

## 2016-05-21 NOTE — Telephone Encounter (Signed)
Yes [name] Little

## 2016-05-21 NOTE — Telephone Encounter (Signed)
[facility] call stated patient need another script called in for the test strips and lantus with the ICD 10 code on the strip.

## 2016-06-05 ENCOUNTER — Other Ambulatory Visit: Payer: Self-pay | Admitting: Endocrinology

## 2016-06-27 ENCOUNTER — Other Ambulatory Visit: Payer: Self-pay | Admitting: Endocrinology

## 2016-07-14 DIAGNOSIS — J209 Acute bronchitis, unspecified: Secondary | ICD-10-CM | POA: Diagnosis not present

## 2016-08-09 ENCOUNTER — Other Ambulatory Visit: Payer: Self-pay | Admitting: Endocrinology

## 2016-08-28 ENCOUNTER — Other Ambulatory Visit (INDEPENDENT_AMBULATORY_CARE_PROVIDER_SITE_OTHER): Payer: Medicare Other

## 2016-08-28 DIAGNOSIS — E1165 Type 2 diabetes mellitus with hyperglycemia: Secondary | ICD-10-CM | POA: Diagnosis not present

## 2016-08-28 LAB — COMPREHENSIVE METABOLIC PANEL
ALT: 18 U/L (ref 0–53)
AST: 17 U/L (ref 0–37)
Albumin: 4.3 g/dL (ref 3.5–5.2)
Alkaline Phosphatase: 48 U/L (ref 39–117)
BILIRUBIN TOTAL: 0.4 mg/dL (ref 0.2–1.2)
BUN: 18 mg/dL (ref 6–23)
CALCIUM: 9.5 mg/dL (ref 8.4–10.5)
CHLORIDE: 104 meq/L (ref 96–112)
CO2: 29 meq/L (ref 19–32)
Creatinine, Ser: 0.89 mg/dL (ref 0.40–1.50)
GFR: 91 mL/min (ref >60.00)
GLUCOSE: 136 mg/dL — AB (ref 70–99)
Potassium: 4.3 mEq/L (ref 3.5–5.1)
Sodium: 138 mEq/L (ref 135–145)
Total Protein: 6.9 g/dL (ref 6.0–8.3)

## 2016-08-28 LAB — HEMOGLOBIN A1C: HEMOGLOBIN A1C: 7.8 % — AB (ref 4.6–6.5)

## 2016-08-30 ENCOUNTER — Encounter: Payer: Self-pay | Admitting: Endocrinology

## 2016-08-30 ENCOUNTER — Ambulatory Visit (INDEPENDENT_AMBULATORY_CARE_PROVIDER_SITE_OTHER): Payer: Medicare Other | Admitting: Endocrinology

## 2016-08-30 VITALS — BP 124/64 | HR 69 | Ht 68.0 in | Wt 172.0 lb

## 2016-08-30 DIAGNOSIS — E1165 Type 2 diabetes mellitus with hyperglycemia: Secondary | ICD-10-CM

## 2016-08-30 MED ORDER — PIOGLITAZONE HCL 15 MG PO TABS: 15.0000 mg | ORAL_TABLET | Freq: Every day | ORAL | 3 refills | Status: DC

## 2016-08-30 NOTE — Patient Instructions (Signed)
Check blood sugars on waking up  2x weekly  Also check blood sugars about 2 hours after a meal and do this after different meals by rotation  Recommended blood sugar levels on waking up is 90-130 and about 2 hours after meal is 130-160  Please bring your blood sugar monitor to each visit, thank you  

## 2016-08-30 NOTE — Progress Notes (Signed)
Patient ID: [name] Little, male   DOB: [birth year], 66 y.o.   MRN: [id number]   Reason for Appointment : Followup for Type 2 Diabetes   History of Present Illness          Diagnosis: Type 2 diabetes mellitus, date of diagnosis: 1994   Recent history:   Oral hypoglycemic drugs the patient is taking are: Janumet XR, Invokana, Amaryl 0.5 mg at dinner       He tends to have relatively high A1c despite fairly good blood sugar readings at home. A1c is 7.8 compared to 7.3 previously  Current blood sugar patterns, management and problems identified:  He was supposed to be taking half a tablet of Amaryl but he is taking full tablet  He is starting to get occasional low sugars during the night with blood sugars in the 60s  Also his bedtime readings which are being checked only very sporadically or around 100-110  He was having relatively high readings in the 140s-160s until about a week or so ago but fasting readings are somewhat better now  Checking blood sugars somewhat sporadically and usually not after supper  He thinks his sugars are better in the last week or so  because of starting to leave basketball in the afternoons now  Has been taking his Invokana before breakfast as directed    Hypoglycemia frequency: About 3 times overnight blood sugars in the 60s recently   Glucose monitoring:  less than 1 times daily        Glucometer: One Touch ultra 2 .      Blood Glucose readings from download show  Mean values apply above for all meters except median for One Touch  PRE-MEAL Fasting Lunch Dinner Bedtime/ Overnight  Overall  Glucose range: 105-153    60-111    Mean/median: 129     138    POST-MEAL PC Breakfast PC Lunch PC Dinner  Glucose range: 77-162  145-182    Mean/median:        Self-care: The diet that the patient has been following is: tries to limit carbohydrates and portions.   Avoiding drinks with sugar. Breakfast: oatmeal or egg/toast  at 8, dinner 6 pm       Physical activity: exercise:On most evenings playing basketball.       Dietician visit: Most recent: 12 years ago            Side effects from medications have been none  Compliance with the medical regimen:  good   Wt Readings from Last 3 Encounters:  08/30/16 172 lb (78 kg)  05/01/16 171 lb (77.6 kg)  12/22/15 161 lb (73 kg)   Lab Results  Component Value Date   HGBA1C 7.8 (H) 08/28/2016   HGBA1C 7.3 (H) 03/15/2016   HGBA1C 7.8 (H) 12/20/2015   Lab Results  Component Value Date   MICROALBUR <0.7 12/20/2015   [place] 78 03/15/2016   CREATININE 0.89 08/28/2016    Past history: He has been treated for his diabetes with Glucovance for many years  with variable control Also appears to have had minimal diabetes education and has been monitoring his blood sugars only sporadically Review of his A1c shows he has had levels of at least 8% since 2009 and high levels in the last 2 years. On his regimen of glyburide/metformin and low dose Januvia his blood sugars were fluctuating significantly ranging from 80-200+ and also occasional symptoms of hypoglycemia His glyburide was stopped because of significant variability in  blood sugars and tendency to hypoglycemia.  Also his Januvia was increased from 25 mg daily therapeutic dose of 100 mg. He was started on low-dose Amaryl on his  visit in 3/15    Lab on 08/28/2016  Component Date Value Ref Range Status  . Hgb A1c MFr Bld 08/28/2016 7.8* 4.6 - 6.5 % Final  . Sodium 08/28/2016 138  135 - 145 mEq/L Final  . Potassium 08/28/2016 4.3  3.5 - 5.1 mEq/L Final  . Chloride 08/28/2016 104  96 - 112 mEq/L Final  . CO2 08/28/2016 29  19 - 32 mEq/L Final  . Glucose, Bld 08/28/2016 136* 70 - 99 mg/dL Final  . BUN 08/28/2016 18  6 - 23 mg/dL Final  . Creatinine, Ser 08/28/2016 0.89  0.40 - 1.50 mg/dL Final  . Total Bilirubin 08/28/2016 0.4  0.2 - 1.2 mg/dL Final  . Alkaline Phosphatase 08/28/2016 48  39 - 117 U/L Final  . AST 08/28/2016 17   0 - 37 U/L Final  . ALT 08/28/2016 18  0 - 53 U/L Final  . Total Protein 08/28/2016 6.9  6.0 - 8.3 g/dL Final  . Albumin 08/28/2016 4.3  3.5 - 5.2 g/dL Final  . Calcium 08/28/2016 9.5  8.4 - 10.5 mg/dL Final  . GFR 08/28/2016 91.00  >60.00 mL/min Final     Allergies as of 08/30/2016   No Known Allergies     Medication List       Accurate as of 08/30/16 11:11 AM. Always use your most recent med list.          aspirin 81 MG tablet Take 81 mg by mouth at bedtime.   glucose blood test strip Commonly known as:  ONE TOUCH ULTRA TEST USE TO CHECK ONCE DAILY- Dx code- E11.65   ibuprofen 200 MG tablet Commonly known as:  ADVIL,MOTRIN 3 tabs w/ meals every 8 hours as needed   INVOKANA 300 MG Tabs tablet Generic drug:  canagliflozin TAKE 1 TABLET BY MOUTH EVERY DAY   JANUMET XR 50-1000 MG Tb24 Generic drug:  SitaGLIPtin-MetFORMIN HCl TAKE 2 TABLETS ONCE DAILY   latanoprost 0.005 % ophthalmic solution Commonly known as:  XALATAN   mometasone 50 MCG/ACT nasal spray Commonly known as:  NASONEX Place 2 sprays into the nose daily. 2 puffs twice daily   ONE TOUCH ULTRA SYSTEM KIT w/Device Kit Use to test once daily   ONETOUCH DELICA LANCETS FINE Misc USE TO TEST ONCE A DAY-Dx code E11.65   oxymetazoline 0.05 % nasal spray Commonly known as:  AFRIN PRN   pioglitazone 15 MG tablet Commonly known as:  ACTOS Take 1 tablet (15 mg total) by mouth daily.   simvastatin 20 MG tablet Commonly known as:  ZOCOR TAKE 1 TABLET DAILY       Allergies: No Known Allergies  Past Medical History:  Diagnosis Date  . Actinic keratosis   . Obesity, unspecified   . Other and unspecified hyperlipidemia   . Type II or unspecified type diabetes mellitus without mention of complication, not stated as uncontrolled   . Unspecified essential hypertension     Past Surgical History:  Procedure Laterality Date  . ROTATOR CUFF REPAIR      Family History  Problem Relation Age of Onset    . COPD Father   . Diabetes Mother   . Cancer Brother   . Thyroid cancer Brother   . Colon cancer Neg Hx     Social History:  reports that he quit  smoking about 18 years ago. His smoking use included Cigars. He has a 72.00 pack-year smoking history. He has never used smokeless tobacco. He reports that he does not drink alcohol or use drugs.    Review of Systems       Lipids:  he has had dyslipidemia  Currently taking  Zocor, Previously on simcor He was told to stop his niacin because of continued low HDL and lack of clinical evidence His LDL is still at target although relatively higher than before but HDL is improved   Lab Results  Component Value Date   CHOL 135 03/15/2016   HDL 42.80 03/15/2016   LDLCALC 78 03/15/2016   TRIG 68.0 03/15/2016   CHOLHDL 3 03/15/2016    The blood pressure has been Normal, Not on any medications except Invokana    Physical Examination:  BP 124/64   Pulse 69   Ht '5\' 8"'  (1.727 m)   Wt 172 lb (78 kg)   SpO2 97%   BMI 26.15 kg/m      ASSESSMENT:  Diabetes type 2 without obesity See history of present illness for details  of current diabetes management, blood sugar patterns and problems identified  A1c is relatively higher at 7.8, previously 7.3 He is monitoring infrequently and not clear what his blood sugar patterns are Most likely has higher postprandial readings Has better fasting readings recently with starting more exercise Currently tending to get overnight hypoglycemia with 1 mg Amaryl at dinnertime  Recommendations:  Start monitoring blood sugars more consistently at various times of the day especially after supper  Consistent exercise  Stop Amaryl  Trial of Actos 15 mg daily.  Discussed that this is an insulin sensitizer, possible side effects and long-term benefits; any tendency to edema should be counteracted by taking Invokana  Follow-up in 3 months again  LIPIDS: To be rechecked on the next visit   Patient  Instructions  Check blood sugars on waking up  2x weekly  Also check blood sugars about 2 hours after a meal and do this after different meals by rotation  Recommended blood sugar levels on waking up is 90-130 and about 2 hours after meal is 130-160  Please bring your blood sugar monitor to each visit, thank you      [facility] 08/30/2016, 11:11 AM

## 2016-09-04 DIAGNOSIS — L821 Other seborrheic keratosis: Secondary | ICD-10-CM | POA: Diagnosis not present

## 2016-09-04 DIAGNOSIS — L814 Other melanin hyperpigmentation: Secondary | ICD-10-CM | POA: Diagnosis not present

## 2016-09-04 DIAGNOSIS — L578 Other skin changes due to chronic exposure to nonionizing radiation: Secondary | ICD-10-CM | POA: Diagnosis not present

## 2016-09-04 DIAGNOSIS — L570 Actinic keratosis: Secondary | ICD-10-CM | POA: Diagnosis not present

## 2016-10-06 ENCOUNTER — Other Ambulatory Visit: Payer: Self-pay | Admitting: Endocrinology

## 2016-10-08 ENCOUNTER — Other Ambulatory Visit: Payer: Self-pay

## 2016-10-08 DIAGNOSIS — H401131 Primary open-angle glaucoma, bilateral, mild stage: Secondary | ICD-10-CM | POA: Diagnosis not present

## 2016-10-08 DIAGNOSIS — H4720 Unspecified optic atrophy: Secondary | ICD-10-CM | POA: Diagnosis not present

## 2016-10-08 LAB — HM DIABETES EYE EXAM

## 2016-10-08 MED ORDER — SIMVASTATIN 20 MG PO TABS: 20.0000 mg | ORAL_TABLET | Freq: Every day | ORAL | 3 refills | Status: DC

## 2016-11-06 DIAGNOSIS — L728 Other follicular cysts of the skin and subcutaneous tissue: Secondary | ICD-10-CM | POA: Diagnosis not present

## 2016-11-06 DIAGNOSIS — Z85828 Personal history of other malignant neoplasm of skin: Secondary | ICD-10-CM | POA: Diagnosis not present

## 2016-11-06 DIAGNOSIS — L578 Other skin changes due to chronic exposure to nonionizing radiation: Secondary | ICD-10-CM | POA: Diagnosis not present

## 2016-11-06 DIAGNOSIS — L570 Actinic keratosis: Secondary | ICD-10-CM | POA: Diagnosis not present

## 2016-11-27 ENCOUNTER — Other Ambulatory Visit (INDEPENDENT_AMBULATORY_CARE_PROVIDER_SITE_OTHER): Payer: Medicare Other

## 2016-11-27 DIAGNOSIS — E1165 Type 2 diabetes mellitus with hyperglycemia: Secondary | ICD-10-CM

## 2016-11-27 LAB — LIPID PANEL
CHOL/HDL RATIO: 3
Cholesterol: 118 mg/dL (ref 0–200)
HDL: 41.7 mg/dL (ref >39.00)
LDL Cholesterol: 61 mg/dL (ref 0–99)
NONHDL: 76.38
Triglycerides: 76 mg/dL (ref 0.0–149.0)
VLDL: 15.2 mg/dL (ref 0.0–40.0)

## 2016-11-27 LAB — COMPREHENSIVE METABOLIC PANEL
ALT: 19 U/L (ref 0–53)
AST: 19 U/L (ref 0–37)
Albumin: 4.3 g/dL (ref 3.5–5.2)
Alkaline Phosphatase: 50 U/L (ref 39–117)
BILIRUBIN TOTAL: 0.6 mg/dL (ref 0.2–1.2)
BUN: 19 mg/dL (ref 6–23)
CO2: 27 meq/L (ref 19–32)
CREATININE: 0.99 mg/dL (ref 0.40–1.50)
Calcium: 9.4 mg/dL (ref 8.4–10.5)
Chloride: 103 mEq/L (ref 96–112)
GFR: 80.42 mL/min (ref >60.00)
GLUCOSE: 167 mg/dL — AB (ref 70–99)
Potassium: 4 mEq/L (ref 3.5–5.1)
SODIUM: 138 meq/L (ref 135–145)
Total Protein: 7 g/dL (ref 6.0–8.3)

## 2016-11-27 LAB — HEMOGLOBIN A1C: HEMOGLOBIN A1C: 8.6 % — AB (ref 4.6–6.5)

## 2016-11-29 ENCOUNTER — Encounter: Payer: Self-pay | Admitting: Endocrinology

## 2016-11-29 ENCOUNTER — Ambulatory Visit (INDEPENDENT_AMBULATORY_CARE_PROVIDER_SITE_OTHER): Payer: Medicare Other | Admitting: Endocrinology

## 2016-11-29 VITALS — BP 119/56 | HR 69 | Ht 68.0 in | Wt 169.4 lb

## 2016-11-29 DIAGNOSIS — E1165 Type 2 diabetes mellitus with hyperglycemia: Secondary | ICD-10-CM | POA: Diagnosis not present

## 2016-11-29 LAB — MICROALBUMIN / CREATININE URINE RATIO
Creatinine,U: 56.3 mg/dL
MICROALB/CREAT RATIO: 1.2 mg/g (ref 0.0–30.0)

## 2016-11-29 NOTE — Patient Instructions (Addendum)
Amaryl 0.5 mg at BEDTIME  Start walking in ams daily  Stop Actos  Check blood sugars on waking up  3/7 days  Also check blood sugars about 2 hours after a meal and do this after different meals by rotation  Recommended blood sugar levels on waking up is 90-130 and about 2 hours after meal is 130-160  Please bring your blood sugar monitor to each visit, thank you

## 2016-11-29 NOTE — Progress Notes (Signed)
Patient ID: [name] Little, male   DOB: 01-21-1951, 66 y.o.   MRN: [id number]   Reason for Appointment : Followup for Type 2 Diabetes   History of Present Illness          Diagnosis: Type 2 diabetes mellitus, date of diagnosis: 1994   Recent history:   Oral hypoglycemic drugs the patient is taking are: Janumet XR, Invokana, Actos 15 mg daily       He tends to have relatively high A1c compared to his home blood sugars A1c is further increased at 8.6, previously has been as low as 7.3  Current blood sugar patterns, management and problems identified:  He was from Amaryl to Actos on his last visit because of tendency to overnight hypoglycemia  Previously was averaging about 129 fasting but now his morning sugars are averaging 173 and he did not report that they sugars started going up  However blood sugars after supper are still excellent and high only once recently  Has only a couple of readings after breakfast which are good but no readings in the afternoon  Also he has not done as much exercise as he was doing last time  Despite Actos he has lost a little weight  He thinks diet is fairly good usually  No side effects with Invokana     Glucose monitoring:  less than 1 times daily        Glucometer: One Touch  Blood Glucose readings from download show  FASTING range 140-203 with median 178 PC supper range 92-2 01 with median 137   Self-care: The diet that the patient has been following is: tries to limit carbohydrates and portions.   Avoiding drinks with sugar. Breakfast: oatmeal or egg/toast  at 8, dinner 6 pm      Physical activity: exercise: less currently       Dietician visit: Most recent: Several years ago        Side effects from medications have been none  Compliance with the medical regimen:  good   Wt Readings from Last 3 Encounters:  11/29/16 169 lb 6.4 oz (76.8 kg)  08/30/16 172 lb (78 kg)  05/01/16 171 lb (77.6 kg)   Lab Results    Component Value Date   HGBA1C 8.6 (H) 11/27/2016   HGBA1C 7.8 (H) 08/28/2016   HGBA1C 7.3 (H) 03/15/2016   Lab Results  Component Value Date   MICROALBUR <0.7 12/20/2015   LDLCALC 61 11/27/2016   CREATININE 0.99 11/27/2016    Past history: He has been treated for his diabetes with Glucovance for many years  with variable control Also appears to have had minimal diabetes education and has been monitoring his blood sugars only sporadically Review of his A1c shows he has had levels of at least 8% since 2009 and high levels in the last 2 years. On his regimen of glyburide/metformin and low dose Januvia his blood sugars were fluctuating significantly ranging from 80-200+ and also occasional symptoms of hypoglycemia His glyburide was stopped because of significant variability in blood sugars and tendency to hypoglycemia.  Also his Januvia was increased from 25 mg daily therapeutic dose of 100 mg. He was started on low-dose Amaryl on his  visit in 3/15    Lab on 11/27/2016  Component Date Value Ref Range Status  . Hgb A1c MFr Bld 11/27/2016 8.6* 4.6 - 6.5 % Final   Glycemic Control Guidelines for People with Diabetes:Non Diabetic:  <6%Goal of Therapy: <7%Additional Action Suggested:  >  8%   . Sodium 11/27/2016 138  135 - 145 mEq/L Final  . Potassium 11/27/2016 4.0  3.5 - 5.1 mEq/L Final  . Chloride 11/27/2016 103  96 - 112 mEq/L Final  . CO2 11/27/2016 27  19 - 32 mEq/L Final  . Glucose, Bld 11/27/2016 167* 70 - 99 mg/dL Final  . BUN 11/27/2016 19  6 - 23 mg/dL Final  . Creatinine, Ser 11/27/2016 0.99  0.40 - 1.50 mg/dL Final  . Total Bilirubin 11/27/2016 0.6  0.2 - 1.2 mg/dL Final  . Alkaline Phosphatase 11/27/2016 50  39 - 117 U/L Final  . AST 11/27/2016 19  0 - 37 U/L Final  . ALT 11/27/2016 19  0 - 53 U/L Final  . Total Protein 11/27/2016 7.0  6.0 - 8.3 g/dL Final  . Albumin 11/27/2016 4.3  3.5 - 5.2 g/dL Final  . Calcium 11/27/2016 9.4  8.4 - 10.5 mg/dL Final  . GFR 11/27/2016  80.42  >60.00 mL/min Final  . Cholesterol 11/27/2016 118  0 - 200 mg/dL Final   ATP III Classification       Desirable:  < 200 mg/dL               Borderline High:  200 - 239 mg/dL          High:  > = 240 mg/dL  . Triglycerides 11/27/2016 76.0  0.0 - 149.0 mg/dL Final   Normal:  <150 mg/dLBorderline High:  150 - 199 mg/dL  . HDL 11/27/2016 41.70  >39.00 mg/dL Final  . VLDL 11/27/2016 15.2  0.0 - 40.0 mg/dL Final  . LDL Cholesterol 11/27/2016 61  0 - 99 mg/dL Final  . Total CHOL/HDL Ratio 11/27/2016 3   Final                  Men          Women1/2 Average Risk     3.4          3.3Average Risk          5.0          4.42X Average Risk          9.6          7.13X Average Risk          15.0          11.0                      . NonHDL 11/27/2016 76.38   Final   NOTE:  Non-HDL goal should be 30 mg/dL higher than patient's LDL goal (i.e. LDL goal of < 70 mg/dL, would have non-HDL goal of < 100 mg/dL)     Allergies as of 11/29/2016   No Known Allergies     Medication List       Accurate as of 11/29/16  9:14 AM. Always use your most recent med list.          aspirin 81 MG tablet Take 81 mg by mouth at bedtime.   glimepiride 1 MG tablet Commonly known as:  AMARYL TAKE 1 TABLET (1 MG TOTAL) BY MOUTH DAILY WITH BREAKFAST.   glucose blood test strip Commonly known as:  ONE TOUCH ULTRA TEST USE TO CHECK ONCE DAILY- Dx code- E11.65   ibuprofen 200 MG tablet Commonly known as:  ADVIL,MOTRIN 3 tabs w/ meals every 8 hours as needed   INVOKANA 300 MG Tabs tablet Generic drug:  canagliflozin  TAKE 1 TABLET BY MOUTH EVERY DAY   JANUMET XR 50-1000 MG Tb24 Generic drug:  SitaGLIPtin-MetFORMIN HCl TAKE 2 TABLETS ONCE DAILY   latanoprost 0.005 % ophthalmic solution Commonly known as:  XALATAN   mometasone 50 MCG/ACT nasal spray Commonly known as:  NASONEX Place 2 sprays into the nose daily. 2 puffs twice daily   ONE TOUCH ULTRA SYSTEM KIT w/Device Kit Use to test once daily   ONETOUCH  DELICA LANCETS FINE Misc USE TO TEST ONCE A DAY-Dx code E11.65   oxymetazoline 0.05 % nasal spray Commonly known as:  AFRIN PRN   pioglitazone 15 MG tablet Commonly known as:  ACTOS Take 1 tablet (15 mg total) by mouth daily.   simvastatin 20 MG tablet Commonly known as:  ZOCOR Take 1 tablet (20 mg total) by mouth daily.       Allergies: No Known Allergies  Past Medical History:  Diagnosis Date  . Actinic keratosis   . Obesity, unspecified   . Other and unspecified hyperlipidemia   . Type II or unspecified type diabetes mellitus without mention of complication, not stated as uncontrolled   . Unspecified essential hypertension     Past Surgical History:  Procedure Laterality Date  . ROTATOR CUFF REPAIR      Family History  Problem Relation Age of Onset  . COPD Father   . Diabetes Mother   . Cancer Brother   . Thyroid cancer Brother   . Colon cancer Neg Hx     Social History:  reports that he quit smoking about 18 years ago. His smoking use included Cigars. He has a 72.00 pack-year smoking history. He has never used smokeless tobacco. He reports that he does not drink alcohol or use drugs.    Review of Systems       Lipids:  he has had dyslipidemia  Currently taking  Zocor, Previously on simcor His LDL is Improved, HDL low normal   Lab Results  Component Value Date   CHOL 118 11/27/2016   HDL 41.70 11/27/2016   LDLCALC 61 11/27/2016   TRIG 76.0 11/27/2016   CHOLHDL 3 11/27/2016    The blood pressure has been Normal, Not on any medications except Invokana    Physical Examination:  BP (!) 119/56   Pulse 69   Ht _0  (1.727 m)   Wt 169 lb 6.4 oz (76.8 kg)   SpO2 97%   BMI 25.76 kg/m      ASSESSMENT:  Diabetes type 2 without obesity See history of present illness for details  of current diabetes management, blood sugar patterns and problems identified  A1c is relatively higher at 8.3 Most of his high readings appear to be overnight and not  postprandial with using Actos instead of Amaryl Also on Janumet and Invokana  Recommendations:  Consistent exercise and needs to start walking  Stop Actos and restart Amaryl, half tablet at bedtime  He will call if blood sugars are not improved or he has hypoglycemia with this in the next 2 weeks  Follow-up in 3 months again  LIPIDS: To be rechecked on the next visit   Patient Instructions  Amaryl 0.5 mg at BEDTIME  Start walking in ams daily  Stop Actos  Check blood sugars on waking up  3/7 days  Also check blood sugars about 2 hours after a meal and do this after different meals by rotation  Recommended blood sugar levels on waking up is 90-130 and about 2 hours after meal  is 130-160  Please bring your blood sugar monitor to each visit, thank you      [facility] 11/29/2016, 9:14 AM

## 2016-12-02 ENCOUNTER — Other Ambulatory Visit: Payer: Self-pay | Admitting: Endocrinology

## 2016-12-26 DIAGNOSIS — S61411A Laceration without foreign body of right hand, initial encounter: Secondary | ICD-10-CM | POA: Diagnosis not present

## 2017-02-07 DIAGNOSIS — D3131 Benign neoplasm of right choroid: Secondary | ICD-10-CM | POA: Diagnosis not present

## 2017-02-07 DIAGNOSIS — H4720 Unspecified optic atrophy: Secondary | ICD-10-CM | POA: Diagnosis not present

## 2017-02-07 DIAGNOSIS — H401131 Primary open-angle glaucoma, bilateral, mild stage: Secondary | ICD-10-CM | POA: Diagnosis not present

## 2017-02-26 ENCOUNTER — Other Ambulatory Visit (INDEPENDENT_AMBULATORY_CARE_PROVIDER_SITE_OTHER): Payer: Medicare Other

## 2017-02-26 DIAGNOSIS — E1165 Type 2 diabetes mellitus with hyperglycemia: Secondary | ICD-10-CM | POA: Diagnosis not present

## 2017-02-26 LAB — HEMOGLOBIN A1C: HEMOGLOBIN A1C: 7.7 % — AB (ref 4.6–6.5)

## 2017-02-26 LAB — BASIC METABOLIC PANEL
BUN: 27 mg/dL — ABNORMAL HIGH (ref 6–23)
CHLORIDE: 105 meq/L (ref 96–112)
CO2: 26 meq/L (ref 19–32)
Calcium: 9.1 mg/dL (ref 8.4–10.5)
Creatinine, Ser: 1.04 mg/dL (ref 0.40–1.50)
GFR: 75.91 mL/min (ref >60.00)
Glucose, Bld: 127 mg/dL — ABNORMAL HIGH (ref 70–99)
Potassium: 4.1 mEq/L (ref 3.5–5.1)
SODIUM: 139 meq/L (ref 135–145)

## 2017-03-04 ENCOUNTER — Ambulatory Visit (INDEPENDENT_AMBULATORY_CARE_PROVIDER_SITE_OTHER): Payer: Medicare Other | Admitting: Endocrinology

## 2017-03-04 ENCOUNTER — Encounter: Payer: Self-pay | Admitting: Endocrinology

## 2017-03-04 VITALS — BP 132/70 | HR 73 | Ht 68.0 in | Wt 175.4 lb

## 2017-03-04 DIAGNOSIS — E1165 Type 2 diabetes mellitus with hyperglycemia: Secondary | ICD-10-CM

## 2017-03-04 NOTE — Progress Notes (Signed)
Patient ID: [name] Little, male   DOB: [birth year], 66 y.o.   MRN: [id number]   Reason for Appointment : Followup for Type 2 Diabetes   History of Present Illness          Diagnosis: Type 2 diabetes mellitus, date of diagnosis: 1994   Recent history:   Oral hypoglycemic drugs the patient is taking are: Janumet XR, Invokana, Amaryl 0.5 mg at bedtime  He tends to have relatively high A1c compared to his home blood sugars A1c is significantly better at 7.7, previously 8.6  He was started on Amaryl back again and Actos was stopped  Current blood sugar patterns, management and problems identified:  He has no hypoglycemia with using Amaryl half a tablet in the evening  However even despite stopping his Actos he has gained weight and he does not know why.  The Invokana and Janumet have been continued and he has no side effects  He has fairly good blood sugars after supper but morning sugars are overall mildly increased again  Blood sugars late afternoon may be low normal  He thinks he is not going off his diet  Although he is not doing much formal exercise he is trying to be active and occasionally playing basketball  No side effects with Invokana     Glucose monitoring:  less than 1 times daily        Glucometer: One Touch  Blood Glucose readings from download show  Mean values apply above for all meters except median for One Touch  PRE-MEAL Fasting Lunch Dinner Bedtime Overall  Glucose range: 97-1 58   94, 105     Mean/median: 132     132    POST-MEAL PC Breakfast PC Lunch PC Dinner  Glucose range:   100-1 203   Mean/median:   135    Self-care: The diet that the patient has been following is: tries to limit carbohydrates and portions.   Avoiding drinks with sugar. Breakfast: oatmeal or egg/toast  at 8, dinner 6 pm      Physical activity: exercise: less currently       Dietician visit: Most recent: Several years ago        Side effects from medications  have been none  Compliance with the medical regimen:  good   Wt Readings from Last 3 Encounters:  03/04/17 175 lb 6.4 oz (79.6 kg)  11/29/16 169 lb 6.4 oz (76.8 kg)  08/30/16 172 lb (78 kg)   Lab Results  Component Value Date   HGBA1C 7.7 (H) 02/26/2017   HGBA1C 8.6 (H) 11/27/2016   HGBA1C 7.8 (H) 08/28/2016   Lab Results  Component Value Date   MICROALBUR <0.7 11/29/2016   [place] 61 11/27/2016   CREATININE 1.04 02/26/2017    Past history: He has been treated for his diabetes with Glucovance for many years  with variable control Also appears to have had minimal diabetes education and has been monitoring his blood sugars only sporadically Review of his A1c shows he has had levels of at least 8% since 2009 and high levels in the last 2 years. On his regimen of glyburide/metformin and low dose Januvia his blood sugars were fluctuating significantly ranging from 80-200+ and also occasional symptoms of hypoglycemia His glyburide was stopped because of significant variability in blood sugars and tendency to hypoglycemia.  Also his Januvia was increased from 25 mg daily therapeutic dose of 100 mg. He was started on low-dose Amaryl on his  visit  in 3/15    Lab on 02/26/2017  Component Date Value Ref Range Status  . Hgb A1c MFr Bld 02/26/2017 7.7* 4.6 - 6.5 % Final   Glycemic Control Guidelines for People with Diabetes:Non Diabetic:  <6%Goal of Therapy: <7%Additional Action Suggested:  >8%   . Sodium 02/26/2017 139  135 - 145 mEq/L Final  . Potassium 02/26/2017 4.1  3.5 - 5.1 mEq/L Final  . Chloride 02/26/2017 105  96 - 112 mEq/L Final  . CO2 02/26/2017 26  19 - 32 mEq/L Final  . Glucose, Bld 02/26/2017 127* 70 - 99 mg/dL Final  . BUN 02/26/2017 27* 6 - 23 mg/dL Final  . Creatinine, Ser 02/26/2017 1.04  0.40 - 1.50 mg/dL Final  . Calcium 02/26/2017 9.1  8.4 - 10.5 mg/dL Final  . GFR 02/26/2017 75.91  >60.00 mL/min Final     Allergies as of 03/04/2017   No Known Allergies       Medication List       Accurate as of 03/04/17  8:16 AM. Always use your most recent med list.          aspirin 81 MG tablet Take 81 mg by mouth at bedtime.   glimepiride 1 MG tablet Commonly known as:  AMARYL TAKE 1 TABLET (1 MG TOTAL) BY MOUTH DAILY WITH BREAKFAST.   glucose blood test strip Commonly known as:  ONE TOUCH ULTRA TEST USE TO CHECK ONCE DAILY- Dx code- E11.65   ibuprofen 200 MG tablet Commonly known as:  ADVIL,MOTRIN 3 tabs w/ meals every 8 hours as needed   INVOKANA 300 MG Tabs tablet Generic drug:  canagliflozin TAKE 1 TABLET BY MOUTH EVERY DAY   JANUMET XR 50-1000 MG Tb24 Generic drug:  SitaGLIPtin-MetFORMIN HCl TAKE 2 TABLETS ONCE DAILY   latanoprost 0.005 % ophthalmic solution Commonly known as:  XALATAN   mometasone 50 MCG/ACT nasal spray Commonly known as:  NASONEX Place 2 sprays into the nose daily. 2 puffs twice daily   ONE TOUCH ULTRA SYSTEM KIT w/Device Kit Use to test once daily   ONETOUCH DELICA LANCETS FINE Misc USE TO TEST ONCE A DAY-Dx code E11.65   pioglitazone 15 MG tablet Commonly known as:  ACTOS Take 1 tablet (15 mg total) by mouth daily.   simvastatin 20 MG tablet Commonly known as:  ZOCOR Take 1 tablet (20 mg total) by mouth daily.       Allergies: No Known Allergies  Past Medical History:  Diagnosis Date  . Actinic keratosis   . Obesity, unspecified   . Other and unspecified hyperlipidemia   . Type II or unspecified type diabetes mellitus without mention of complication, not stated as uncontrolled   . Unspecified essential hypertension     Past Surgical History:  Procedure Laterality Date  . ROTATOR CUFF REPAIR      Family History  Problem Relation Age of Onset  . COPD Father   . Diabetes Mother   . Cancer Brother   . Thyroid cancer Brother   . Colon cancer Neg Hx     Social History:  reports that he quit smoking about 18 years ago. His smoking use included Cigars. He has a 72.00 pack-year smoking  history. He has never used smokeless tobacco. He reports that he does not drink alcohol or use drugs.    Review of Systems       Lipids:  he has had dyslipidemia  Currently taking  Zocor His LDL is Improved, HDL low normal  Lab Results  Component Value Date   CHOL 118 11/27/2016   HDL 41.70 11/27/2016   LDLCALC 61 11/27/2016   TRIG 76.0 11/27/2016   CHOLHDL 3 11/27/2016    The blood pressure has been Normal, Not on any medications except Invokana  He is followed very regularly by his ophthalmologist for glaucoma, no reports available  No symptoms of numbness or tingling in his feet    Physical Examination:  BP 132/70   Pulse 73   Ht _0  (1.727 m)   Wt 175 lb 6.4 oz (79.6 kg)   SpO2 96%   BMI 26.67 kg/m       Diabetic Foot Exam - Simple   Simple Foot Form Diabetic Foot exam was performed with the following findings:  Yes 03/04/2017  8:19 AM  Visual Inspection No deformities, no ulcerations, no other skin breakdown bilaterally:  Yes Sensation Testing Intact to touch and monofilament testing bilaterally:  Yes Pulse Check Posterior Tibialis and Dorsalis pulse intact bilaterally:  Yes Comments     ASSESSMENT:  Diabetes type 2 without obesity See history of present illness for details  of current diabetes management, blood sugar patterns and problems identified   With adding only a small dose of 0.5 mg Amaryl his fasting readings are on an average about 40 mg lower Probably has not been benefiting from Actos previously as stopping this has not worsened his control and he also has not lost any weight with this change Fasting readings are still relatively high but he usually has better readings later in the day He is asking about stopping diet drinks, he is using 3-4 daily   Recommendations:  He will try to walk more and increase activity level  He can start cutting back on diet drinks too at least 1 or 2 a day  Call if blood sugars are consistently  high  No change in medication regimen Consistent exercise and needs to start walking   Follow-up in 3 months again  Blood pressure normal, likely benefiting from [place]  He will have his eye exam reports sent to [place]  There are no Patient Instructions on file for this visit.    Izetta Sakamoto 03/04/2017, 8:16 AM

## 2017-03-15 ENCOUNTER — Telehealth: Payer: Self-pay | Admitting: Endocrinology

## 2017-03-15 NOTE — Telephone Encounter (Signed)
Routing to you °

## 2017-03-15 NOTE — Telephone Encounter (Signed)
Patient returning phone call. Please call to advise.

## 2017-03-15 NOTE — Telephone Encounter (Signed)
Patient would like to discuss the INVOKANA 300 MG TABS tablet that he is on. He has heard negative side effects from the medication. Call patient to advise.

## 2017-03-15 NOTE — Telephone Encounter (Signed)
Called patient and left a voice message to let him know that he can call me back and we can discuss the Lithium.

## 2017-03-15 NOTE — Telephone Encounter (Signed)
Called patient and he stated that he is concerned about the side effects of the Invokana. They have seen some things on the TV and wanted to know if there is something different that he can take other than Invokana. I advised for them to contact their insurance company to see what is covered and to call us back to let us know. Patient stated that something is making him go to the bathroom more often and not sure if it is the Invokana or the Janumet XR. Patient will call back to let us know which medication is covered.

## 2017-03-22 NOTE — Telephone Encounter (Signed)
He can take Imodium for diarrhea as needed.  Occasionally Janumet can cause diarrhea. He needs to ignore the advertising on TV for Invokana as the potential side effects are not confirmed on large studies.  There is much more risk of having problems with diabetes not being controlled compared to taking Invokana peer also he has been taking this for some time without problems

## 2017-03-22 NOTE — Telephone Encounter (Signed)
Called patient [facility] for patient to call back. Patient had diarrhea couple times and did not know which medication patient is wanting to get.  Please advise of which medication patient is needing so that they won't continuously have problems.

## 2017-03-25 NOTE — Telephone Encounter (Signed)
Called patient and left a voice message to call me back so I can go over instructions from Dr. Dwyane Dee.

## 2017-03-26 NOTE — Telephone Encounter (Signed)
Patient returning phone call from yesterday. Patient stated best time to reach him is any time after  Lunch. Please call patient and advise.

## 2017-03-29 NOTE — Telephone Encounter (Signed)
Spoke with pt and informed him of Dr. Ronnie Derby message below. The pt gave a verbal understanding.

## 2017-04-11 ENCOUNTER — Other Ambulatory Visit: Payer: Self-pay | Admitting: Endocrinology

## 2017-04-25 ENCOUNTER — Ambulatory Visit (INDEPENDENT_AMBULATORY_CARE_PROVIDER_SITE_OTHER): Payer: Medicare Other

## 2017-04-25 DIAGNOSIS — Z23 Encounter for immunization: Secondary | ICD-10-CM | POA: Diagnosis not present

## 2017-05-12 ENCOUNTER — Other Ambulatory Visit: Payer: Self-pay | Admitting: Endocrinology

## 2017-05-14 ENCOUNTER — Ambulatory Visit: Payer: 59 | Admitting: Nurse Practitioner

## 2017-05-21 ENCOUNTER — Ambulatory Visit: Payer: Medicare Other | Admitting: Internal Medicine

## 2017-05-23 DIAGNOSIS — N3001 Acute cystitis with hematuria: Secondary | ICD-10-CM | POA: Diagnosis not present

## 2017-05-23 DIAGNOSIS — N3090 Cystitis, unspecified without hematuria: Secondary | ICD-10-CM | POA: Diagnosis not present

## 2017-05-27 ENCOUNTER — Other Ambulatory Visit (INDEPENDENT_AMBULATORY_CARE_PROVIDER_SITE_OTHER): Payer: Medicare Other

## 2017-05-27 DIAGNOSIS — E1165 Type 2 diabetes mellitus with hyperglycemia: Secondary | ICD-10-CM

## 2017-05-27 LAB — COMPREHENSIVE METABOLIC PANEL
ALT: 17 U/L (ref 0–53)
AST: 16 U/L (ref 0–37)
Albumin: 3.9 g/dL (ref 3.5–5.2)
Alkaline Phosphatase: 67 U/L (ref 39–117)
BILIRUBIN TOTAL: 0.5 mg/dL (ref 0.2–1.2)
BUN: 16 mg/dL (ref 6–23)
CO2: 30 meq/L (ref 19–32)
Calcium: 9.6 mg/dL (ref 8.4–10.5)
Chloride: 100 mEq/L (ref 96–112)
Creatinine, Ser: 0.92 mg/dL (ref 0.40–1.50)
GFR: 87.38 mL/min (ref >60.00)
GLUCOSE: 175 mg/dL — AB (ref 70–99)
Potassium: 4.6 mEq/L (ref 3.5–5.1)
SODIUM: 136 meq/L (ref 135–145)
Total Protein: 7.3 g/dL (ref 6.0–8.3)

## 2017-05-27 LAB — HEMOGLOBIN A1C: Hgb A1c MFr Bld: 8.2 % — ABNORMAL HIGH (ref 4.6–6.5)

## 2017-05-30 ENCOUNTER — Other Ambulatory Visit: Payer: Self-pay | Admitting: Endocrinology

## 2017-06-03 DIAGNOSIS — E785 Hyperlipidemia, unspecified: Secondary | ICD-10-CM | POA: Diagnosis not present

## 2017-06-03 DIAGNOSIS — E1169 Type 2 diabetes mellitus with other specified complication: Secondary | ICD-10-CM | POA: Diagnosis not present

## 2017-06-03 DIAGNOSIS — S93602A Unspecified sprain of left foot, initial encounter: Secondary | ICD-10-CM | POA: Diagnosis not present

## 2017-06-03 DIAGNOSIS — E7800 Pure hypercholesterolemia, unspecified: Secondary | ICD-10-CM | POA: Diagnosis not present

## 2017-06-04 ENCOUNTER — Ambulatory Visit (INDEPENDENT_AMBULATORY_CARE_PROVIDER_SITE_OTHER): Payer: Medicare Other | Admitting: Endocrinology

## 2017-06-04 ENCOUNTER — Encounter: Payer: Self-pay | Admitting: Endocrinology

## 2017-06-04 VITALS — BP 120/74 | HR 76 | Ht 68.0 in | Wt 176.8 lb

## 2017-06-04 DIAGNOSIS — E1165 Type 2 diabetes mellitus with hyperglycemia: Secondary | ICD-10-CM

## 2017-06-04 NOTE — Progress Notes (Signed)
Patient ID: [name] Little, male   DOB: [birth year], 66 y.o.   MRN: [id number]   Reason for Appointment : Followup for Type 2 Diabetes   History of Present Illness          Diagnosis: Type 2 diabetes mellitus, date of diagnosis: 1994   Recent history:   Oral hypoglycemic drugs the patient is taking are: Janumet XR 50/1000, Invokana 300 mg, Amaryl 0.5 mg at supper  He tends to have relatively high A1c compared to his home blood sugars  A1c is higher again on this visit at 8.2, previously was significantly better at 7.7,   Current blood sugar patterns, management and problems identified:  He has no hypoglycemia with using Amaryl half a tablet at suppertime which has helped his overnight readings  However FASTING readings are being checked sporadically and have been high on occasion including on the lab when his glucose was over 180 late morning  Postprandial readings are being checked mostly after breakfast and these are within target  Has sporadic readings after lunch and dinner which are not consistently high  He has gained some weight since this summer and has not been exercising  Previously may have gained weight with Actos but this was unclear  He has not been motivated to go walking even though exercise has been recommended consistently  He believes his diet has been usually fairly good recently     Glucose monitoring:  less than 1 times daily        Glucometer: One Touch  Blood Glucose readings from download show  Mean values apply above for all meters except median for One Touch  PRE-MEAL Fasting Lunch Dinner Bedtime Overall  Glucose range: 107-162    99-1 66    Mean/median: 133    151  141    POST-MEAL PC Breakfast PC Lunch PC Dinner  Glucose range: 136-157     Mean/median: 147        Self-care: The diet that the patient has been following is: tries to limit carbohydrates and portions.   Avoiding drinks with sugar. Breakfast: oatmeal or egg/toast   at 8, dinner 6 pm      Physical activity: exercise: none recently       Dietician visit: Most recent: Several years ago        Side effects from medications have been none  Compliance with the medical regimen:  good   Wt Readings from Last 3 Encounters:  06/04/17 176 lb 12.8 oz (80.2 kg)  03/04/17 175 lb 6.4 oz (79.6 kg)  11/29/16 169 lb 6.4 oz (76.8 kg)   Lab Results  Component Value Date   HGBA1C 8.2 (H) 05/27/2017   HGBA1C 7.7 (H) 02/26/2017   HGBA1C 8.6 (H) 11/27/2016   Lab Results  Component Value Date   MICROALBUR <0.7 11/29/2016   LDLCALC 61 11/27/2016   CREATININE 0.92 05/27/2017    Lab Results  Component Value Date   FRUCTOSAMINE 315 (H) 10/21/2013   FRUCTOSAMINE 320 (H) 06/16/2013   FRUCTOSAMINE 389 (H) 03/26/2013    Past history: He has been treated for his diabetes with Glucovance for many years  with variable control Also appears to have had minimal diabetes education and has been monitoring his blood sugars only sporadically Review of his A1c shows he has had levels of at least 8% since 2009 and high levels in the last 2 years. On his regimen of glyburide/metformin and low dose Januvia his blood sugars were fluctuating significantly  ranging from 80-200+ and also occasional symptoms of hypoglycemia His glyburide was stopped because of significant variability in blood sugars and tendency to hypoglycemia.  Also his Januvia was increased from 25 mg daily therapeutic dose of 100 mg. He was started on low-dose Amaryl on his  visit in 3/15    No visits with results within 1 Week(s) from this visit.  Latest known visit with results is:  Lab on 05/27/2017  Component Date Value Ref Range Status  . Sodium 05/27/2017 136  135 - 145 mEq/L Final  . Potassium 05/27/2017 4.6  3.5 - 5.1 mEq/L Final  . Chloride 05/27/2017 100  96 - 112 mEq/L Final  . CO2 05/27/2017 30  19 - 32 mEq/L Final  . Glucose, Bld 05/27/2017 175* 70 - 99 mg/dL Final  . BUN 05/27/2017 16  6 - 23  mg/dL Final  . Creatinine, Ser 05/27/2017 0.92  0.40 - 1.50 mg/dL Final  . Total Bilirubin 05/27/2017 0.5  0.2 - 1.2 mg/dL Final  . Alkaline Phosphatase 05/27/2017 67  39 - 117 U/L Final  . AST 05/27/2017 16  0 - 37 U/L Final  . ALT 05/27/2017 17  0 - 53 U/L Final  . Total Protein 05/27/2017 7.3  6.0 - 8.3 g/dL Final  . Albumin 05/27/2017 3.9  3.5 - 5.2 g/dL Final  . Calcium 05/27/2017 9.6  8.4 - 10.5 mg/dL Final  . GFR 05/27/2017 87.38  >60.00 mL/min Final  . Hgb A1c MFr Bld 05/27/2017 8.2* 4.6 - 6.5 % Final   Glycemic Control Guidelines for People with Diabetes:Non Diabetic:  <6%Goal of Therapy: <7%Additional Action Suggested:  >8%      Allergies as of 06/04/2017   No Known Allergies     Medication List        Accurate as of 06/04/17  4:29 PM. Always use your most recent med list.          aspirin 81 MG tablet Take 81 mg by mouth at bedtime.   glimepiride 1 MG tablet Commonly known as:  AMARYL TAKE 1 TABLET (1 MG TOTAL) BY MOUTH DAILY WITH BREAKFAST.   glucose blood test strip Commonly known as:  ONE TOUCH ULTRA TEST USE TO CHECK ONCE DAILY- Dx code- E11.65   ibuprofen 200 MG tablet Commonly known as:  ADVIL,MOTRIN 3 tabs w/ meals every 8 hours as needed   INVOKANA 300 MG Tabs tablet Generic drug:  canagliflozin TAKE 1 TABLET BY MOUTH EVERY DAY   JANUMET XR 50-1000 MG Tb24 Generic drug:  SitaGLIPtin-MetFORMIN HCl TAKE 2 TABLETS ONCE DAILY   latanoprost 0.005 % ophthalmic solution Commonly known as:  XALATAN   mometasone 50 MCG/ACT nasal spray Commonly known as:  NASONEX Place 2 sprays into the nose daily. 2 puffs twice daily   ONE TOUCH ULTRA SYSTEM KIT w/Device Kit Use to test once daily   ONETOUCH DELICA LANCETS FINE Misc USE TO TEST ONCE A DAY-Dx code E11.65   simvastatin 20 MG tablet Commonly known as:  ZOCOR Take 1 tablet (20 mg total) by mouth daily.       Allergies: No Known Allergies  Past Medical History:  Diagnosis Date  . Actinic  keratosis   . Obesity, unspecified   . Other and unspecified hyperlipidemia   . Type II or unspecified type diabetes mellitus without mention of complication, not stated as uncontrolled   . Unspecified essential hypertension     Past Surgical History:  Procedure Laterality Date  . ROTATOR CUFF REPAIR  Family History  Problem Relation Age of Onset  . COPD Father   . Diabetes Mother   . Cancer Brother   . Thyroid cancer Brother   . Colon cancer Neg Hx     Social History:  reports that he quit smoking about 18 years ago. His smoking use included cigars. He has a 72.00 pack-year smoking history. he has never used smokeless tobacco. He reports that he does not drink alcohol or use drugs.    Review of Systems       Lipids:  he has had dyslipidemia  Currently taking  Zocor 20 mg with adequate control His LDL is Improved, HDL low normal   Lab Results  Component Value Date   CHOL 118 11/27/2016   HDL 41.70 11/27/2016   LDLCALC 61 11/27/2016   TRIG 76.0 11/27/2016   CHOLHDL 3 11/27/2016    The blood pressure has been Normal, Not on any medications except Invokana  He is followed  regularly by his ophthalmologist for glaucoma  Last foot exam: 02/2017  Since his last visit he has had a UTI treated at the urgent care center, not clear of the details   Physical Examination:  BP 120/74   Pulse 76   Ht '5\' 8"'  (1.727 m)   Wt 176 lb 12.8 oz (80.2 kg)   SpO2 98%   BMI 26.88 kg/m        ASSESSMENT:  Diabetes type 2 without obesity See history of present illness for details  of current diabetes management, blood sugar patterns and problems identified   Although his A1c had improved on the last visit it is not increasing Again his A1c is higher than expected for his home blood sugar average of 137 He does however need to check more readings fasting and after supper since lab glucose was relatively higher at 175 fasting Not able to increase his Amaryl further because of  readings as low as 79 late at night once with minimal symptoms  Recommendations:  He will try to walk at the mall or other activities for regular brisk exercise  He needs to start taking more sugars after evening meal and also some fasting  Continue Invokana.  Discussed that although sporadically this can cause UTI this is his first episode after a couple of years of taking the medication and may not be related  Will try to check his fructosamine also at the same time to correlate with his A1c   Follow-up in 3 months again    Patient Instructions  Exercise upto daily 30 min       Milia Warth 06/04/2017, 4:29 PM

## 2017-06-04 NOTE — Patient Instructions (Addendum)
Exercise upto daily 30 min

## 2017-06-06 ENCOUNTER — Other Ambulatory Visit: Payer: Self-pay

## 2017-06-06 ENCOUNTER — Telehealth: Payer: Self-pay | Admitting: Endocrinology

## 2017-06-06 MED ORDER — CANAGLIFLOZIN 300 MG PO TABS: 300.0000 mg | ORAL_TABLET | Freq: Every day | ORAL | 3 refills | Status: DC

## 2017-06-06 NOTE — Telephone Encounter (Signed)
He needs to call the urgent care center again about possible recurrence of UTI Meanwhile he can stop the Invokana and increase the glimepiride to the full tablet at night after a couple of days

## 2017-06-06 NOTE — Telephone Encounter (Signed)
Called patient and he stated that he is having pain in his side and it is relieved when he urinates and he is having to urinate every 45 mins. He finished antibiotics for a UTI on Sunday that he received from the Urgent Care.   Please advise.  I have also sent in a new Rx for his Invokana to [facility].

## 2017-06-06 NOTE — Telephone Encounter (Signed)
Call pt [phone].  Urinary frequency. Also, pt has minimal amount of INVOKANNA do you want to call in more or should he do a refill at the pharmacy.

## 2017-06-06 NOTE — Telephone Encounter (Signed)
Called patient and gave him the message from Dr. Dwyane Dee and they are going to call back to let us know how his blood sugars are running and if he is feeling better.

## 2017-06-13 DIAGNOSIS — H401131 Primary open-angle glaucoma, bilateral, mild stage: Secondary | ICD-10-CM | POA: Diagnosis not present

## 2017-06-24 ENCOUNTER — Other Ambulatory Visit: Payer: Self-pay | Admitting: Endocrinology

## 2017-06-25 ENCOUNTER — Other Ambulatory Visit: Payer: Self-pay | Admitting: Endocrinology

## 2017-06-26 ENCOUNTER — Other Ambulatory Visit: Payer: Self-pay

## 2017-06-26 MED ORDER — GLUCOSE BLOOD VI STRP: ORAL_STRIP | 2 refills | Status: DC

## 2017-07-11 ENCOUNTER — Other Ambulatory Visit: Payer: Self-pay

## 2017-07-11 MED ORDER — ONETOUCH DELICA LANCETS FINE MISC: 2 refills | Status: DC

## 2017-07-25 ENCOUNTER — Other Ambulatory Visit: Payer: Self-pay | Admitting: Dermatology

## 2017-07-25 DIAGNOSIS — C44319 Basal cell carcinoma of skin of other parts of face: Secondary | ICD-10-CM | POA: Diagnosis not present

## 2017-07-25 DIAGNOSIS — L821 Other seborrheic keratosis: Secondary | ICD-10-CM | POA: Diagnosis not present

## 2017-07-25 DIAGNOSIS — L570 Actinic keratosis: Secondary | ICD-10-CM | POA: Diagnosis not present

## 2017-07-25 DIAGNOSIS — L578 Other skin changes due to chronic exposure to nonionizing radiation: Secondary | ICD-10-CM | POA: Diagnosis not present

## 2017-07-25 DIAGNOSIS — D225 Melanocytic nevi of trunk: Secondary | ICD-10-CM | POA: Diagnosis not present

## 2017-08-06 ENCOUNTER — Other Ambulatory Visit: Payer: Self-pay | Admitting: Endocrinology

## 2017-09-02 ENCOUNTER — Other Ambulatory Visit (INDEPENDENT_AMBULATORY_CARE_PROVIDER_SITE_OTHER): Payer: Medicare Other

## 2017-09-02 DIAGNOSIS — E1165 Type 2 diabetes mellitus with hyperglycemia: Secondary | ICD-10-CM | POA: Diagnosis not present

## 2017-09-02 LAB — COMPREHENSIVE METABOLIC PANEL
ALT: 17 U/L (ref 0–53)
AST: 12 U/L (ref 0–37)
Albumin: 4.3 g/dL (ref 3.5–5.2)
Alkaline Phosphatase: 57 U/L (ref 39–117)
BILIRUBIN TOTAL: 0.5 mg/dL (ref 0.2–1.2)
BUN: 18 mg/dL (ref 6–23)
CO2: 27 mEq/L (ref 19–32)
Calcium: 9.8 mg/dL (ref 8.4–10.5)
Chloride: 100 mEq/L (ref 96–112)
Creatinine, Ser: 0.98 mg/dL (ref 0.40–1.50)
GFR: 81.17 mL/min (ref >60.00)
GLUCOSE: 218 mg/dL — AB (ref 70–99)
Potassium: 4.4 mEq/L (ref 3.5–5.1)
SODIUM: 137 meq/L (ref 135–145)
TOTAL PROTEIN: 7.1 g/dL (ref 6.0–8.3)

## 2017-09-02 LAB — HEMOGLOBIN A1C: Hgb A1c MFr Bld: 9 % — ABNORMAL HIGH (ref 4.6–6.5)

## 2017-09-03 ENCOUNTER — Other Ambulatory Visit: Payer: Self-pay | Admitting: Endocrinology

## 2017-09-04 ENCOUNTER — Encounter: Payer: Self-pay | Admitting: Endocrinology

## 2017-09-04 ENCOUNTER — Ambulatory Visit (INDEPENDENT_AMBULATORY_CARE_PROVIDER_SITE_OTHER): Payer: Medicare Other | Admitting: Endocrinology

## 2017-09-04 VITALS — BP 132/78 | HR 79 | Ht 68.0 in | Wt 184.8 lb

## 2017-09-04 DIAGNOSIS — E1165 Type 2 diabetes mellitus with hyperglycemia: Secondary | ICD-10-CM

## 2017-09-04 LAB — URINALYSIS, ROUTINE W REFLEX MICROSCOPIC
Bilirubin Urine: NEGATIVE
KETONES UR: NEGATIVE
Leukocytes, UA: NEGATIVE
Nitrite: NEGATIVE
PH: 5.5 (ref 5.0–8.0)
SPECIFIC GRAVITY, URINE: 1.02 (ref 1.000–1.030)
TOTAL PROTEIN, URINE-UPE24: NEGATIVE
Urine Glucose: >=1000 — AB
Urobilinogen, UA: 0.2 (ref 0.0–1.0)

## 2017-09-04 MED ORDER — INSULIN DEGLUDEC 100 UNIT/ML ~~LOC~~ SOPN
PEN_INJECTOR | SUBCUTANEOUS | 1 refills | Status: DC
Start: 2017-09-04 — End: 2017-09-27

## 2017-09-04 MED ORDER — CANAGLIFLOZIN 100 MG PO TABS: ORAL_TABLET | ORAL | 3 refills | Status: DC

## 2017-09-04 NOTE — Progress Notes (Signed)
Patient ID: [name] Little, male   DOB: 06-03-1951, 67 y.o.   MRN: [id number]   Reason for Appointment : Followup for Type 2 Diabetes   History of Present Illness          Diagnosis: Type 2 diabetes mellitus, date of diagnosis: 1994   Recent history:   Oral hypoglycemic drugs the patient is taking are: Janumet XR 50/1000, Invokana not taking, Amaryl 1 mg at supper  He tends to have relatively high A1c compared to his home blood sugars  A1c is higher again on this visit at 9%, previously 8.2 and has been as low as 7.7 last year  Current blood sugar patterns, management and problems identified:  He had a second episode of UTI after his visit in November and he was told to hold off as Invokana  Amaryl was increased to 1 mg at dinnertime  However his blood sugars are overall worse, were getting higher in the last visit also as judged by A1c  Now his fasting blood sugars are averaging nearly 180 compared to 133 previously on Invokana  He has only 3 or 4 readings nonfasting and these are mostly high except right at bedtime Monday may be low normal or low  He has also gained weight with stopping Invokana  Only recently thinks he is trying to be consistent with using healthy meals and snacks    Glucose monitoring:  less than 1 times daily        Glucometer: One Touch  Blood Glucose readings from download show  FASTING blood sugar RANGE 164-245 AVERAGE 179 Overall median 178 with lowest reading 65 at bedtime   Self-care: The diet that the patient has been following is: tries to limit carbohydrates and portions.   Avoiding drinks with sugar. Breakfast: oatmeal or egg/toast  at 8, dinner 6 pm      Physical activity: exercise: none        Dietician visit: Most recent: Several years ago        Side effects from medications have been none  Compliance with the medical regimen:  good   Wt Readings from Last 3 Encounters:  09/04/17 184 lb 12.8 oz (83.8 kg)  06/04/17 176  lb 12.8 oz (80.2 kg)  03/04/17 175 lb 6.4 oz (79.6 kg)   Lab Results  Component Value Date   HGBA1C 9.0 (H) 09/02/2017   HGBA1C 8.2 (H) 05/27/2017   HGBA1C 7.7 (H) 02/26/2017   Lab Results  Component Value Date   MICROALBUR <0.7 11/29/2016   LDLCALC 61 11/27/2016   CREATININE 0.98 09/02/2017    Lab Results  Component Value Date   FRUCTOSAMINE 315 (H) 10/21/2013   FRUCTOSAMINE 320 (H) 06/16/2013   FRUCTOSAMINE 389 (H) 03/26/2013    Past history: He has been treated for his diabetes with Glucovance for many years  with variable control Also appears to have had minimal diabetes education and has been monitoring his blood sugars only sporadically Review of his A1c shows he has had levels of at least 8% since 2009 and high levels in the last 2 years. On his regimen of glyburide/metformin and low dose Januvia his blood sugars were fluctuating significantly ranging from 80-200+ and also occasional symptoms of hypoglycemia His glyburide was stopped because of significant variability in blood sugars and tendency to hypoglycemia.  Also his Januvia was increased from 25 mg daily therapeutic dose of 100 mg. He was started on low-dose Amaryl on his  visit in 3/15  Office Visit on 09/04/2017  Component Date Value Ref Range Status  . Color, Urine 09/04/2017 YELLOW  Yellow;Lt. Yellow Final  . APPearance 09/04/2017 CLEAR  Clear Final  . Specific Gravity, Urine 09/04/2017 1.020  1.000 - 1.030 Final  . pH 09/04/2017 5.5  5.0 - 8.0 Final  . Total Protein, Urine 09/04/2017 NEGATIVE  Negative Final  . Urine Glucose 09/04/2017 >=1000* Negative Final  . Ketones, ur 09/04/2017 NEGATIVE  Negative Final  . Bilirubin Urine 09/04/2017 NEGATIVE  Negative Final  . Hgb urine dipstick 09/04/2017 TRACE-INTACT* Negative Final  . Urobilinogen, UA 09/04/2017 0.2  0.0 - 1.0 Final  . Leukocytes, UA 09/04/2017 NEGATIVE  Negative Final  . Nitrite 09/04/2017 NEGATIVE  Negative Final  . WBC, UA 09/04/2017  0-2/hpf  0-2/hpf Final  . RBC / HPF 09/04/2017 0-2/hpf  0-2/hpf Final  Lab on 09/02/2017  Component Date Value Ref Range Status  . Sodium 09/02/2017 137  135 - 145 mEq/L Final  . Potassium 09/02/2017 4.4  3.5 - 5.1 mEq/L Final  . Chloride 09/02/2017 100  96 - 112 mEq/L Final  . CO2 09/02/2017 27  19 - 32 mEq/L Final  . Glucose, Bld 09/02/2017 218* 70 - 99 mg/dL Final  . BUN 09/02/2017 18  6 - 23 mg/dL Final  . Creatinine, Ser 09/02/2017 0.98  0.40 - 1.50 mg/dL Final  . Total Bilirubin 09/02/2017 0.5  0.2 - 1.2 mg/dL Final  . Alkaline Phosphatase 09/02/2017 57  39 - 117 U/L Final  . AST 09/02/2017 12  0 - 37 U/L Final  . ALT 09/02/2017 17  0 - 53 U/L Final  . Total Protein 09/02/2017 7.1  6.0 - 8.3 g/dL Final  . Albumin 09/02/2017 4.3  3.5 - 5.2 g/dL Final  . Calcium 09/02/2017 9.8  8.4 - 10.5 mg/dL Final  . GFR 09/02/2017 81.17  >60.00 mL/min Final  . Hgb A1c MFr Bld 09/02/2017 9.0* 4.6 - 6.5 % Final   Glycemic Control Guidelines for People with Diabetes:Non Diabetic:  <6%Goal of Therapy: <7%Additional Action Suggested:  >8%      Allergies as of 09/04/2017   No Known Allergies     Medication List        Accurate as of 09/04/17  2:09 PM. Always use your most recent med list.          aspirin 81 MG tablet Take 81 mg by mouth at bedtime.   canagliflozin 100 MG Tabs tablet Commonly known as:  INVOKANA 1 tablet before breakfast   fluorouracil 5 % cream Commonly known as:  EFUDEX APPLY ON THE SKIN TWICE A DAY APPLY UP TO 3 WEEKS   glimepiride 1 MG tablet Commonly known as:  AMARYL TAKE 1 TABLET (1 MG TOTAL) BY MOUTH DAILY WITH BREAKFAST.   glucose blood test strip Commonly known as:  ONE TOUCH ULTRA TEST USE TO CHECK ONCE DAILY- Dx code- E11.65   ibuprofen 200 MG tablet Commonly known as:  ADVIL,MOTRIN 3 tabs w/ meals every 8 hours as needed   insulin degludec 100 UNIT/ML Sopn FlexTouch Pen Commonly known as:  TRESIBA FLEXTOUCH 15 U qd   JANUMET XR 50-1000 MG  Tb24 Generic drug:  SitaGLIPtin-MetFORMIN HCl TAKE 2 TABLETS ONCE DAILY   latanoprost 0.005 % ophthalmic solution Commonly known as:  XALATAN   mometasone 50 MCG/ACT nasal spray Commonly known as:  NASONEX Place 2 sprays into the nose daily. 2 puffs twice daily   ONE TOUCH ULTRA SYSTEM KIT w/Device Kit Use to  test once daily   ONETOUCH DELICA LANCETS FINE Misc USE TO TEST ONCE DAILY Dx E11.65   simvastatin 20 MG tablet Commonly known as:  ZOCOR Take 1 tablet (20 mg total) by mouth daily.       Allergies: No Known Allergies  Past Medical History:  Diagnosis Date  . Actinic keratosis   . Obesity, unspecified   . Other and unspecified hyperlipidemia   . Type II or unspecified type diabetes mellitus without mention of complication, not stated as uncontrolled   . Unspecified essential hypertension     Past Surgical History:  Procedure Laterality Date  . ROTATOR CUFF REPAIR      Family History  Problem Relation Age of Onset  . COPD Father   . Diabetes Mother   . Cancer Brother   . Thyroid cancer Brother   . Colon cancer Neg Hx     Social History:  reports that he quit smoking about 19 years ago. His smoking use included cigars. He has a 72.00 pack-year smoking history. he has never used smokeless tobacco. He reports that he does not drink alcohol or use drugs.    Review of Systems       Lipids:  he has had dyslipidemia  Currently taking  Zocor 20 mg with adequate control His LDL is excellent, HDL low normal   Lab Results  Component Value Date   CHOL 118 11/27/2016   HDL 41.70 11/27/2016   LDLCALC 61 11/27/2016   TRIG 76.0 11/27/2016   CHOLHDL 3 11/27/2016    The blood pressure has been Normal, Not on any medications   He is followed  regularly by his ophthalmologist for glaucoma  Last foot exam: 02/2017    Physical Examination:  BP 132/78   Pulse 79   Ht _0  (1.727 m)   Wt 184 lb 12.8 oz (83.8 kg)   SpO2 94%   BMI 28.10 kg/m          ASSESSMENT:  Diabetes type 2 without obesity See history of present illness for details  of current diabetes management, blood sugar patterns and problems identified   Although his A1c had improved on the last visit it is not increasing Again his A1c is higher than expected for his home blood sugar average of 137 He does however need to check more readings fasting and after supper since lab glucose was relatively higher at 175 fasting Not able to increase his Amaryl further because of readings as low as 79 late at night once with minimal symptoms  Recommendations:  He is going to be started on basal insulin with the help of the nurse educator  Discussed in detail actions of insulin, role of insulin and types of insulins available  Discussed use of the basal insulin and titration of the dose based on fasting readings  He will start with 8 units of Tresiba once a day, patient information given both interested in diabetes and insulin in general  At the same time he can reduce Amaryl to half a tablet to avoid late night low sugars  Also since he had benefited from [facility] he can start with half tablet of 100 mg daily but will also check his urinalysis to make sure he is not having a chronic UTI  Encouraged him to start being more active with walking or other program  Follow-up in 4 weeks    Patient Instructions  Glimeperide 1/2 at diner   Tresiba insulin: This insulin provides blood sugar control  for up to 24 hours.    Start with 8 units at bedtime daily and increase by 2 units every 3 days until the waking up sugars are under 130.  Then continue the same dose.  If blood sugar is under 90 for 2 days in a row, reduce the dose by 2 units.  Note that this insulin does not control the rise of blood sugar with meals        Counseling time on subjects discussed in assessment and plan sections is over 50% of today's 25 minute visit   Elayne Snare 09/04/2017, 2:09 PM

## 2017-09-04 NOTE — Patient Instructions (Signed)
Glimeperide 1/2 at [employer] insulin: This insulin provides blood sugar control for up to 24 hours.    Start with 8 units at bedtime daily and increase by 2 units every 3 days until the waking up sugars are under 130.  Then continue the same dose.  If blood sugar is under 90 for 2 days in a row, reduce the dose by 2 units.  Note that this insulin does not control the rise of blood sugar with meals

## 2017-09-05 ENCOUNTER — Telehealth: Payer: Self-pay | Admitting: Dietician

## 2017-09-05 NOTE — Telephone Encounter (Signed)
Returned patient's call regarding questions about the insulin and spoke to his wife.  She stated that when they picked up the Tresiba from the pharmacy, it said to take 15 units but at the office he was told to take 8 units at bedtime.    Told her that he should start with 8 units and if the fasting blood sugar in the am was under 130 to only continue with 8 units but if it was over 130 to increase 2 units until the goal blood sugar of 130 was reached in the am as per Dr. Ronnie Derby note.  She verbalized understanding.  Reviewed insulin site rotation. Reviewed treatment of hypoglycemia and she was able to verbalize.  They are to call for any further questions.  Antonieta Iba, RD, LDN, CDE

## 2017-09-10 ENCOUNTER — Telehealth: Payer: Self-pay | Admitting: Nutrition

## 2017-09-10 NOTE — Telephone Encounter (Signed)
Returning a message from patient to call him concerning questions about his insulin. Wife says husband has cut out a lot of carbs in his meals, and his FBSs are all 109 or below.  Last night blood sugar 2 hours after supper was 90.  He has not started/taken  the insulin at all.

## 2017-09-10 NOTE — Telephone Encounter (Signed)
He also needs to check some readings 2 hours after meals, to keep follow-up appointment

## 2017-09-11 NOTE — Telephone Encounter (Signed)
Pt's wife informed of below.  

## 2017-09-11 NOTE — Telephone Encounter (Signed)
Pt's wife informed. She states he checked CBG last night was 111 after dinner. Two hours later it dropped to 65.  This morning at 8:19 am it was 109. I advised her to have check CBGs 2 hours after meals and keep ROV on 09/27/17.

## 2017-09-11 NOTE — Telephone Encounter (Signed)
Please make sure he is taking only half a tablet of glimepiride at bedtime

## 2017-09-23 DIAGNOSIS — L905 Scar conditions and fibrosis of skin: Secondary | ICD-10-CM | POA: Diagnosis not present

## 2017-09-23 DIAGNOSIS — Z85828 Personal history of other malignant neoplasm of skin: Secondary | ICD-10-CM | POA: Diagnosis not present

## 2017-09-23 DIAGNOSIS — L570 Actinic keratosis: Secondary | ICD-10-CM | POA: Diagnosis not present

## 2017-09-27 ENCOUNTER — Ambulatory Visit (INDEPENDENT_AMBULATORY_CARE_PROVIDER_SITE_OTHER): Payer: Medicare Other | Admitting: Endocrinology

## 2017-09-27 ENCOUNTER — Encounter: Payer: Self-pay | Admitting: Endocrinology

## 2017-09-27 VITALS — BP 130/70 | HR 74 | Ht 68.0 in | Wt 179.0 lb

## 2017-09-27 DIAGNOSIS — E1165 Type 2 diabetes mellitus with hyperglycemia: Secondary | ICD-10-CM | POA: Diagnosis not present

## 2017-09-27 NOTE — Progress Notes (Signed)
Patient ID: [name] Little, male   DOB: 30-May-1951, 67 y.o.   MRN: [id number]   Reason for Appointment : Followup for Type 2 Diabetes   History of Present Illness          Diagnosis: Type 2 diabetes mellitus, date of diagnosis: 1994   Recent history:   Oral hypoglycemic drugs the patient is taking are: Janumet XR 50/1000, Invokana 50, Amaryl 1 mg at supper  He tends to have relatively high A1c compared to his home blood sugar average  A1c is 9% as of 1/19, previously as low as 7.7   Current blood sugar patterns, management and problems identified:  He was recommended basal insulin on his  visit in 2/19 because of persistently high sugars especially fasting  However he decided to go on a low carbohydrate diet and not take any insulin, he even had picked up the prescription for Tresiba from the pharmacy  Surprisingly with his change in diet only his fasting blood sugars are much better and averaging under 125 now  He is continuing to take 0.5 mg Amaryl only at suppertime and even with this he may have blood sugars as low as 62 at bedtime  He has also been able to lose weight, about 5 pounds in the last 1  Has tried to be more active with walking or playing basketball  Because of previous benefit with [facility] he is now taking half of the 100 mg tablet, previously had possibly 2 episodes of UTI related to this  He does have some randomly higher blood sugars related to going off his diet occasionally such as eating cake but recently most of his blood sugars are looking fairly good  He has also checked his blood sugar nearly 3 times a day on an average now    Glucose monitoring:  less than 1 times daily        Glucometer: One Touch  Blood Glucose readings from download show  Mean values apply above for all meters except median for One Touch  PRE-MEAL Fasting Lunch Dinner Bedtime Overall  Glucose range:  84-215      Mean/median:  109   96   112   POST-MEAL PC  Breakfast PC Lunch PC Dinner  Glucose range:    62-274  Mean/median:  153  148  118      Self-care: The diet that the patient has been following is: tries to limit carbohydrates and portions.   Avoiding drinks with sugar. Breakfast: oatmeal or egg/toast  at 8, dinner 6 pm           Dietician visit: Most recent: Several years ago        Side effects from medications have been none  Compliance with the medical regimen:  good   Wt Readings from Last 3 Encounters:  09/27/17 179 lb (81.2 kg)  09/04/17 184 lb 12.8 oz (83.8 kg)  06/04/17 176 lb 12.8 oz (80.2 kg)   Lab Results  Component Value Date   HGBA1C 9.0 (H) 09/02/2017   HGBA1C 8.2 (H) 05/27/2017   HGBA1C 7.7 (H) 02/26/2017   Lab Results  Component Value Date   MICROALBUR <0.7 11/29/2016   LDLCALC 61 11/27/2016   CREATININE 0.98 09/02/2017    Lab Results  Component Value Date   FRUCTOSAMINE 315 (H) 10/21/2013   FRUCTOSAMINE 320 (H) 06/16/2013   FRUCTOSAMINE 389 (H) 03/26/2013    Past history: He has been treated for his diabetes with Glucovance for many  years  with variable control Also appears to have had minimal diabetes education and has been monitoring his blood sugars only sporadically Review of his A1c shows he has had levels of at least 8% since 2009 and high levels in the last 2 years. On his regimen of glyburide/metformin and low dose Januvia his blood sugars were fluctuating significantly ranging from 80-200+ and also occasional symptoms of hypoglycemia His glyburide was stopped because of significant variability in blood sugars and tendency to hypoglycemia.  Also his Januvia was increased from 25 mg daily therapeutic dose of 100 mg. He was started on low-dose Amaryl on his  visit in 3/15    No visits with results within 1 Week(s) from this visit.  Latest known visit with results is:  Office Visit on 09/04/2017  Component Date Value Ref Range Status  . Color, Urine 09/04/2017 YELLOW  Yellow;Lt. Yellow  Final  . APPearance 09/04/2017 CLEAR  Clear Final  . Specific Gravity, Urine 09/04/2017 1.020  1.000 - 1.030 Final  . pH 09/04/2017 5.5  5.0 - 8.0 Final  . Total Protein, Urine 09/04/2017 NEGATIVE  Negative Final  . Urine Glucose 09/04/2017 >=1000* Negative Final  . Ketones, ur 09/04/2017 NEGATIVE  Negative Final  . Bilirubin Urine 09/04/2017 NEGATIVE  Negative Final  . Hgb urine dipstick 09/04/2017 TRACE-INTACT* Negative Final  . Urobilinogen, UA 09/04/2017 0.2  0.0 - 1.0 Final  . Leukocytes, UA 09/04/2017 NEGATIVE  Negative Final  . Nitrite 09/04/2017 NEGATIVE  Negative Final  . WBC, UA 09/04/2017 0-2/hpf  0-2/hpf Final  . RBC / HPF 09/04/2017 0-2/hpf  0-2/hpf Final     Allergies as of 09/27/2017   No Known Allergies     Medication List        Accurate as of 09/27/17 11:59 PM. Always use your most recent med list.          aspirin 81 MG tablet Take 81 mg by mouth at bedtime.   canagliflozin 100 MG Tabs tablet Commonly known as:  INVOKANA 1 tablet before breakfast   glimepiride 1 MG tablet Commonly known as:  AMARYL TAKE 1 TABLET (1 MG TOTAL) BY MOUTH DAILY WITH BREAKFAST.   glucose blood test strip Commonly known as:  ONE TOUCH ULTRA TEST USE TO CHECK ONCE DAILY- Dx code- E11.65   ibuprofen 200 MG tablet Commonly known as:  ADVIL,MOTRIN 3 tabs w/ meals every 8 hours as needed   imiquimod 5 % cream Commonly known as:  ALDARA APPLY ON THE SKIN AS DIRECTED. APPLY TO SCALP MONDAY,WEDNESDAY,FRIDAY FOR 4WEEKS   JANUMET XR 50-1000 MG Tb24 Generic drug:  SitaGLIPtin-MetFORMIN HCl TAKE 2 TABLETS ONCE DAILY   latanoprost 0.005 % ophthalmic solution Commonly known as:  XALATAN   mometasone 50 MCG/ACT nasal spray Commonly known as:  NASONEX Place 2 sprays into the nose daily. 2 puffs twice daily   ONE TOUCH ULTRA SYSTEM KIT w/Device Kit Use to test once daily   ONETOUCH DELICA LANCETS FINE Misc USE TO TEST ONCE DAILY Dx E11.65   simvastatin 20 MG  tablet Commonly known as:  ZOCOR Take 1 tablet (20 mg total) by mouth daily.       Allergies: No Known Allergies  Past Medical History:  Diagnosis Date  . Actinic keratosis   . Obesity, unspecified   . Other and unspecified hyperlipidemia   . Type II or unspecified type diabetes mellitus without mention of complication, not stated as uncontrolled   . Unspecified essential hypertension  Past Surgical History:  Procedure Laterality Date  . ROTATOR CUFF REPAIR      Family History  Problem Relation Age of Onset  . COPD Father   . Diabetes Mother   . Cancer Brother   . Thyroid cancer Brother   . Colon cancer Neg Hx     Social History:  reports that he quit smoking about 19 years ago. His smoking use included cigars. He has a 72.00 pack-year smoking history. He has never used smokeless tobacco. He reports that he does not drink alcohol or use drugs.    Review of Systems       Lipids:  he has had dyslipidemia:  Currently taking  Zocor 20 mg with adequate control His LDL is excellent, HDL low normal   Lab Results  Component Value Date   CHOL 118 11/27/2016   HDL 41.70 11/27/2016   LDLCALC 61 11/27/2016   TRIG 76.0 11/27/2016   CHOLHDL 3 11/27/2016    The blood pressure has been normal without medication   He is followed  regularly by his ophthalmologist for glaucoma  Last foot exam: 02/2017    Physical Examination:  BP 130/70 (BP Location: Left Arm, Patient Position: Sitting, Cuff Size: Normal)   Pulse 74   Ht '5\' 8"'  (1.727 m)   Wt 179 lb (81.2 kg)   SpO2 96%   BMI 27.22 kg/m        ASSESSMENT:  Diabetes type 2 without obesity See history of present illness for details  of current diabetes management, blood sugar patterns and problems identified   Although his A1c had been up to 9% and he was recommended basal insulin his blood sugars recently are remarkably better with his going on a significantly low carbohydrate diet with also low fat intake He  is also trying to be much more active than before with resulting weight loss Blood sugars are now excellent compared to his previous readings, previously had readings such as 175 fasting on his lab work He does not tend to have high postprandial readings especially at night but sporadically will have high readings when he is eating more carbohydrates or sweets Also taking low-dose Invokana, 50 mg currently because of previous episodes of UTI possibly linked to this  Recommendations: May try to stop Amaryl to see if blood sugars are still consistent and to avoid any tendency to hypoglycemia which he has occasionally had Continue excellent diet Regular exercise    Patient Instructions  Leave off Amaryl      Elayne Snare 09/29/2017, 3:54 PM

## 2017-09-27 NOTE — Patient Instructions (Signed)
Leave off Amaryl

## 2017-10-10 DIAGNOSIS — H4720 Unspecified optic atrophy: Secondary | ICD-10-CM | POA: Diagnosis not present

## 2017-10-10 DIAGNOSIS — H401131 Primary open-angle glaucoma, bilateral, mild stage: Secondary | ICD-10-CM | POA: Diagnosis not present

## 2017-10-14 ENCOUNTER — Other Ambulatory Visit: Payer: Self-pay | Admitting: Endocrinology

## 2017-10-30 DIAGNOSIS — L570 Actinic keratosis: Secondary | ICD-10-CM | POA: Diagnosis not present

## 2017-10-30 DIAGNOSIS — L905 Scar conditions and fibrosis of skin: Secondary | ICD-10-CM | POA: Diagnosis not present

## 2017-10-30 DIAGNOSIS — Z85828 Personal history of other malignant neoplasm of skin: Secondary | ICD-10-CM | POA: Diagnosis not present

## 2017-11-28 DIAGNOSIS — J0100 Acute maxillary sinusitis, unspecified: Secondary | ICD-10-CM | POA: Diagnosis not present

## 2017-12-04 ENCOUNTER — Other Ambulatory Visit: Payer: Self-pay | Admitting: Endocrinology

## 2017-12-15 DIAGNOSIS — L259 Unspecified contact dermatitis, unspecified cause: Secondary | ICD-10-CM | POA: Diagnosis not present

## 2017-12-26 ENCOUNTER — Other Ambulatory Visit (INDEPENDENT_AMBULATORY_CARE_PROVIDER_SITE_OTHER): Payer: Medicare Other

## 2017-12-26 DIAGNOSIS — E1165 Type 2 diabetes mellitus with hyperglycemia: Secondary | ICD-10-CM

## 2017-12-26 LAB — LIPID PANEL
Cholesterol: 124 mg/dL (ref 0–200)
HDL: 39.2 mg/dL (ref >39.00)
LDL CALC: 64 mg/dL (ref 0–99)
NonHDL: 85.19
Total CHOL/HDL Ratio: 3
Triglycerides: 108 mg/dL (ref 0.0–149.0)
VLDL: 21.6 mg/dL (ref 0.0–40.0)

## 2017-12-26 LAB — MICROALBUMIN / CREATININE URINE RATIO
CREATININE, U: 87.9 mg/dL
Microalb Creat Ratio: 0.8 mg/g (ref 0.0–30.0)

## 2017-12-26 LAB — COMPREHENSIVE METABOLIC PANEL
ALT: 20 U/L (ref 0–53)
AST: 16 U/L (ref 0–37)
Albumin: 4.1 g/dL (ref 3.5–5.2)
Alkaline Phosphatase: 51 U/L (ref 39–117)
BUN: 20 mg/dL (ref 6–23)
CALCIUM: 9.4 mg/dL (ref 8.4–10.5)
CHLORIDE: 102 meq/L (ref 96–112)
CO2: 29 meq/L (ref 19–32)
Creatinine, Ser: 1 mg/dL (ref 0.40–1.50)
GFR: 79.23 mL/min (ref >60.00)
Glucose, Bld: 165 mg/dL — ABNORMAL HIGH (ref 70–99)
Potassium: 4.3 mEq/L (ref 3.5–5.1)
SODIUM: 138 meq/L (ref 135–145)
Total Bilirubin: 0.5 mg/dL (ref 0.2–1.2)
Total Protein: 6.7 g/dL (ref 6.0–8.3)

## 2017-12-26 LAB — HEMOGLOBIN A1C: Hgb A1c MFr Bld: 8 % — ABNORMAL HIGH (ref 4.6–6.5)

## 2017-12-27 LAB — FRUCTOSAMINE: FRUCTOSAMINE: 309 umol/L — AB (ref 0–285)

## 2018-01-01 ENCOUNTER — Encounter: Payer: Self-pay | Admitting: Endocrinology

## 2018-01-01 ENCOUNTER — Ambulatory Visit (INDEPENDENT_AMBULATORY_CARE_PROVIDER_SITE_OTHER): Payer: Medicare Other | Admitting: Endocrinology

## 2018-01-01 VITALS — BP 118/68 | HR 70 | Ht 68.0 in | Wt 171.2 lb

## 2018-01-01 DIAGNOSIS — E1165 Type 2 diabetes mellitus with hyperglycemia: Secondary | ICD-10-CM | POA: Diagnosis not present

## 2018-01-01 NOTE — Patient Instructions (Addendum)
Check blood sugars on waking up 3/7   Also check blood sugars about 2 hours after a meal and do this after different meals by rotation  Recommended blood sugar levels on waking up is 90-130 and about 2 hours after meal is 130-160  Please bring your blood sugar monitor to each visit, thank you  Take 1mg  Amaryl at supper  When nite sugar <140 go to 1/2 pill

## 2018-01-01 NOTE — Progress Notes (Signed)
Patient ID: [name] Little, male   DOB: [birth year], 67 y.o.   MRN: [id number]   Reason for Appointment : Followup for Type 2 Diabetes   History of Present Illness          Diagnosis: Type 2 diabetes mellitus, date of diagnosis: 1994   Recent history:   Oral hypoglycemic drugs the patient is taking are: Janumet XR 50/1000, Invokana 50, Amaryl 0.5 mg at supper  He tends to have relatively high A1c compared to his home blood sugar average  A1c is 8%, previously 9% Fructosamine is still relatively high at 309, however it prudent A1c is about 7%  Current blood sugar patterns, management and problems identified:  He was having relatively low sugars even down to 62 after supper and he was told to try and leave off his Amaryl  However he says that his blood sugar started going up earlier this month partly because of getting steroids for his poison ivy  He has gone back up to his half tablet of Amaryl again at suppertime  With his getting steroids for poison ivy his blood sugars are mostly high after meals  However in the last few days his fasting blood sugars are also relatively high including on his labs  Most of his blood sugar testing is in the morning  He has however lost 8 pounds since his last visit and is staying very active    Glucose monitoring:  less than 1 times daily        Glucometer: One Touch  Blood Glucose readings from download show the following  Mean values apply above for all meters except median for One Touch  PRE-MEAL Fasting Lunch Dinner Bedtime Overall  Glucose range:  100-153   108, 127     Mean/median:  122     126   POST-MEAL PC Breakfast PC Lunch PC Dinner  Glucose range:    103-247  Mean/median:    173    Previous readings:  PRE-MEAL Fasting Lunch Dinner Bedtime Overall  Glucose range:  84-215      Mean/median:  109   96   112   POST-MEAL PC Breakfast PC Lunch PC Dinner  Glucose range:    62-274  Mean/median:  153  148  118      Self-care: The diet that the patient has been following is: tries to limit carbohydrates and portions.   Avoiding drinks with sugar. Breakfast: oatmeal or egg/toast  at 8, dinner 6 pm         Dietician visit: Most recent: Several years ago        Side effects from medications have been none  Compliance with the medical regimen:  good   Wt Readings from Last 3 Encounters:  01/01/18 171 lb 3.2 oz (77.7 kg)  09/27/17 179 lb (81.2 kg)  09/04/17 184 lb 12.8 oz (83.8 kg)   Lab Results  Component Value Date   HGBA1C 8.0 (H) 12/26/2017   HGBA1C 9.0 (H) 09/02/2017   HGBA1C 8.2 (H) 05/27/2017   Lab Results  Component Value Date   MICROALBUR <0.7 12/26/2017   LDLCALC 64 12/26/2017   CREATININE 1.00 12/26/2017    Lab Results  Component Value Date   FRUCTOSAMINE 309 (H) 12/26/2017   FRUCTOSAMINE 315 (H) 10/21/2013   FRUCTOSAMINE 320 (H) 06/16/2013    Past history: He has been treated for his diabetes with Glucovance for many years  with variable control Also appears to have had minimal diabetes  education and has been monitoring his blood sugars only sporadically Review of his A1c shows he has had levels of at least 8% since 2009 and high levels in the last 2 years. On his regimen of glyburide/metformin and low dose Januvia his blood sugars were fluctuating significantly ranging from 80-200+ and also occasional symptoms of hypoglycemia His glyburide was stopped because of significant variability in blood sugars and tendency to hypoglycemia.  Also his Januvia was increased from 25 mg daily therapeutic dose of 100 mg. He was started on low-dose Amaryl on his  visit in 3/15    Lab on 12/26/2017  Component Date Value Ref Range Status  . Cholesterol 12/26/2017 124  0 - 200 mg/dL Final   ATP III Classification       Desirable:  < 200 mg/dL               Borderline High:  200 - 239 mg/dL          High:  > = 240 mg/dL  . Triglycerides 12/26/2017 108.0  0.0 - 149.0 mg/dL Final    Normal:  <150 mg/dLBorderline High:  150 - 199 mg/dL  . HDL 12/26/2017 39.20  >39.00 mg/dL Final  . VLDL 12/26/2017 21.6  0.0 - 40.0 mg/dL Final  . LDL Cholesterol 12/26/2017 64  0 - 99 mg/dL Final  . Total CHOL/HDL Ratio 12/26/2017 3   Final                  Men          Women1/2 Average Risk     3.4          3.3Average Risk          5.0          4.42X Average Risk          9.6          7.13X Average Risk          15.0          11.0                      . NonHDL 12/26/2017 85.19   Final   NOTE:  Non-HDL goal should be 30 mg/dL higher than patient's LDL goal (i.e. LDL goal of < 70 mg/dL, would have non-HDL goal of < 100 mg/dL)  . Microalb, Ur 12/26/2017 <0.7  0.0 - 1.9 mg/dL Final  . Creatinine,U 12/26/2017 87.9  mg/dL Final  . Microalb Creat Ratio 12/26/2017 0.8  0.0 - 30.0 mg/g Final  . Sodium 12/26/2017 138  135 - 145 mEq/L Final  . Potassium 12/26/2017 4.3  3.5 - 5.1 mEq/L Final  . Chloride 12/26/2017 102  96 - 112 mEq/L Final  . CO2 12/26/2017 29  19 - 32 mEq/L Final  . Glucose, Bld 12/26/2017 165* 70 - 99 mg/dL Final  . BUN 12/26/2017 20  6 - 23 mg/dL Final  . Creatinine, Ser 12/26/2017 1.00  0.40 - 1.50 mg/dL Final  . Total Bilirubin 12/26/2017 0.5  0.2 - 1.2 mg/dL Final  . Alkaline Phosphatase 12/26/2017 51  39 - 117 U/L Final  . AST 12/26/2017 16  0 - 37 U/L Final  . ALT 12/26/2017 20  0 - 53 U/L Final  . Total Protein 12/26/2017 6.7  6.0 - 8.3 g/dL Final  . Albumin 12/26/2017 4.1  3.5 - 5.2 g/dL Final  . Calcium 12/26/2017 9.4  8.4 - 10.5 mg/dL Final  .  GFR 12/26/2017 79.23  >60.00 mL/min Final  . Hgb A1c MFr Bld 12/26/2017 8.0* 4.6 - 6.5 % Final   Glycemic Control Guidelines for People with Diabetes:Non Diabetic:  <6%Goal of Therapy: <7%Additional Action Suggested:  >8%   . Fructosamine 12/26/2017 309* 0 - 285 umol/L Final   Comment: Published reference interval for apparently healthy subjects between age 69 and 17 is 69 - 285 umol/L and in a poorly controlled diabetic  population is 228 - 563 umol/L with a mean of 396 umol/L.      Allergies as of 01/01/2018   No Known Allergies     Medication List        Accurate as of 01/01/18 11:51 AM. Always use your most recent med list.          aspirin 81 MG tablet Take 81 mg by mouth at bedtime.   canagliflozin 100 MG Tabs tablet Commonly known as:  INVOKANA 1 tablet before breakfast   glimepiride 1 MG tablet Commonly known as:  AMARYL TAKE 1 TABLET (1 MG TOTAL) BY MOUTH DAILY WITH BREAKFAST.   glucose blood test strip Commonly known as:  ONE TOUCH ULTRA TEST USE TO CHECK ONCE DAILY- Dx code- E11.65   ibuprofen 200 MG tablet Commonly known as:  ADVIL,MOTRIN 3 tabs w/ meals every 8 hours as needed   imiquimod 5 % cream Commonly known as:  ALDARA APPLY ON THE SKIN AS DIRECTED. APPLY TO SCALP MONDAY,WEDNESDAY,FRIDAY FOR 4WEEKS   JANUMET XR 50-1000 MG Tb24 Generic drug:  SitaGLIPtin-MetFORMIN HCl TAKE 2 TABLETS ONCE DAILY   latanoprost 0.005 % ophthalmic solution Commonly known as:  XALATAN   mometasone 50 MCG/ACT nasal spray Commonly known as:  NASONEX Place 2 sprays into the nose daily. 2 puffs twice daily   ONE TOUCH ULTRA SYSTEM KIT w/Device Kit Use to test once daily   ONETOUCH DELICA LANCETS FINE Misc USE TO TEST ONCE DAILY Dx E11.65   simvastatin 20 MG tablet Commonly known as:  ZOCOR TAKE 1 TABLET BY MOUTH ONCE DAILY       Allergies: No Known Allergies  Past Medical History:  Diagnosis Date  . Actinic keratosis   . Obesity, unspecified   . Other and unspecified hyperlipidemia   . Type II or unspecified type diabetes mellitus without mention of complication, not stated as uncontrolled   . Unspecified essential hypertension     Past Surgical History:  Procedure Laterality Date  . ROTATOR CUFF REPAIR      Family History  Problem Relation Age of Onset  . COPD Father   . Diabetes Mother   . Cancer Brother   . Thyroid cancer Brother   . Colon cancer Neg Hx      Social History:  reports that he quit smoking about 19 years ago. His smoking use included cigars. He has a 72.00 pack-year smoking history. He has never used smokeless tobacco. He reports that he does not drink alcohol or use drugs.    Review of Systems       Lipids:    Currently taking  Zocor 20 mg with adequate control His LDL is excellent, HDL low normal as before   Lab Results  Component Value Date   CHOL 124 12/26/2017   HDL 39.20 12/26/2017   LDLCALC 64 12/26/2017   TRIG 108.0 12/26/2017   CHOLHDL 3 12/26/2017    The blood pressure has been normal without drug treatment   He is followed  regularly by his ophthalmologist for glaucoma  every quarter  Last foot exam: 02/2017    Physical Examination:  BP 118/68 (BP Location: Left Arm, Patient Position: Sitting, Cuff Size: Normal)   Pulse 70   Ht '5\' 8"'  (1.727 m)   Wt 171 lb 3.2 oz (77.7 kg)   SpO2 97%   BMI 26.03 kg/m        ASSESSMENT:  Diabetes type 2 without obesity See history of present illness for details  of current diabetes management, blood sugar patterns and problems identified   Although his A1c is still high at 8% it appears to be higher than expected His fructosamine is 309 which would be indicative of an A1c of around 7%  He appears to be still needing Amaryl since his blood sugars have been higher more recently, previously with the 0.5 mg dosage he was tending to have occasional hypoglycemia after supper Currently his fasting readings are still high and he is still been showing some hypoglycemia from getting steroids lately  He has done well with weight loss and exercise  Recommendations: May try 1 mg Amaryl to see if he has more consistent readings in the next few days but once his blood sugars are below 140 at night he will cut it down to half a pill If he has inconsistent control or hypoglycemia may try to take Invokana 50 mg twice daily in the next visit He will continue Janumet XR  unchanged  LIPIDS: Continue 20 mg Zocor as lipids are excellent    Patient Instructions  Check blood sugars on waking up 3/7   Also check blood sugars about 2 hours after a meal and do this after different meals by rotation  Recommended blood sugar levels on waking up is 90-130 and about 2 hours after meal is 130-160  Please bring your blood sugar monitor to each visit, thank you  Take 45m Amaryl at supper  When nite sugar <140 go to 1/2 pill       AElayne Snare6/26/2019, 11:51 AM

## 2018-01-07 ENCOUNTER — Other Ambulatory Visit: Payer: Self-pay | Admitting: Endocrinology

## 2018-01-21 DIAGNOSIS — L819 Disorder of pigmentation, unspecified: Secondary | ICD-10-CM | POA: Diagnosis not present

## 2018-01-21 DIAGNOSIS — L570 Actinic keratosis: Secondary | ICD-10-CM | POA: Diagnosis not present

## 2018-02-11 DIAGNOSIS — H4720 Unspecified optic atrophy: Secondary | ICD-10-CM | POA: Diagnosis not present

## 2018-02-11 DIAGNOSIS — H401131 Primary open-angle glaucoma, bilateral, mild stage: Secondary | ICD-10-CM | POA: Diagnosis not present

## 2018-04-01 ENCOUNTER — Ambulatory Visit (INDEPENDENT_AMBULATORY_CARE_PROVIDER_SITE_OTHER): Payer: Medicare Other | Admitting: Endocrinology

## 2018-04-01 ENCOUNTER — Encounter: Payer: Self-pay | Admitting: Endocrinology

## 2018-04-01 VITALS — BP 140/80 | Ht 68.0 in | Wt 180.0 lb

## 2018-04-01 DIAGNOSIS — E1165 Type 2 diabetes mellitus with hyperglycemia: Secondary | ICD-10-CM | POA: Diagnosis not present

## 2018-04-01 DIAGNOSIS — Z23 Encounter for immunization: Secondary | ICD-10-CM | POA: Diagnosis not present

## 2018-04-01 LAB — URINALYSIS, ROUTINE W REFLEX MICROSCOPIC
BILIRUBIN URINE: NEGATIVE
HGB URINE DIPSTICK: NEGATIVE
KETONES UR: NEGATIVE
LEUKOCYTES UA: NEGATIVE
Nitrite: NEGATIVE
RBC / HPF: NONE SEEN (ref 0–?)
SPECIFIC GRAVITY, URINE: 1.01 (ref 1.000–1.030)
Total Protein, Urine: NEGATIVE
UROBILINOGEN UA: 0.2 (ref 0.0–1.0)
Urine Glucose: >=1000 — AB
pH: 5.5 (ref 5.0–8.0)

## 2018-04-01 LAB — BASIC METABOLIC PANEL
BUN: 16 mg/dL (ref 6–23)
CALCIUM: 9.3 mg/dL (ref 8.4–10.5)
CO2: 26 meq/L (ref 19–32)
CREATININE: 0.96 mg/dL (ref 0.40–1.50)
Chloride: 103 mEq/L (ref 96–112)
GFR: 82.98 mL/min (ref >60.00)
Glucose, Bld: 175 mg/dL — ABNORMAL HIGH (ref 70–99)
Potassium: 4 mEq/L (ref 3.5–5.1)
Sodium: 138 mEq/L (ref 135–145)

## 2018-04-01 LAB — POCT GLYCOSYLATED HEMOGLOBIN (HGB A1C): Hemoglobin A1C: 8 % — AB (ref 4.0–5.6)

## 2018-04-01 NOTE — Progress Notes (Signed)
Patient ID: [name] Little, male   DOB: [birth year], 67 y.o.   MRN: [id number]   Reason for Appointment : Followup for Type 2 Diabetes   History of Present Illness          Diagnosis: Type 2 diabetes mellitus, date of diagnosis: 1994   Recent history:   Oral hypoglycemic drugs the patient is taking are: Janumet XR 50/1000, Invokana 50 mg in a.m., Amaryl 0.5 mg at supper  He tends to have relatively high A1c compared to his home blood sugar average  A1c is again 8%, previously 9%   Current blood sugar patterns, management and problems identified:  He was advised to increase his Amaryl to 1 mg on the last visit because of relatively high readings after supper but he did not do so  Also he has gained about 9 pounds from not watching his portions especially in the evening  He is not exercising much except some yard work  Has not done readings after meals usually  Also his fasting blood sugars are mostly high with a range of about 130-150  Has checked his blood sugars only sporadically and mostly in the morning except one reading after lunch which was about 160     Glucose monitoring:  less than 1 times daily        Glucometer: One Touch  Blood Glucose readings from download as above    Self-care: The diet that the patient has been following is: tries to limit carbohydrates and portions.   Avoiding drinks with sugar. Breakfast: oatmeal or egg/toast  at 8, dinner 6 pm         Dietician visit: Most recent: Several years ago        Side effects from medications have been none  Compliance with the medical regimen:  good   Wt Readings from Last 3 Encounters:  04/01/18 180 lb (81.6 kg)  01/01/18 171 lb 3.2 oz (77.7 kg)  09/27/17 179 lb (81.2 kg)   Lab Results  Component Value Date   HGBA1C 8.0 (A) 04/01/2018   HGBA1C 8.0 (H) 12/26/2017   HGBA1C 9.0 (H) 09/02/2017   Lab Results  Component Value Date   MICROALBUR <0.7 12/26/2017   LDLCALC 64 12/26/2017   CREATININE 0.96 04/01/2018    Lab Results  Component Value Date   FRUCTOSAMINE 309 (H) 12/26/2017   FRUCTOSAMINE 315 (H) 10/21/2013   FRUCTOSAMINE 320 (H) 06/16/2013    Past history: He has been treated for his diabetes with Glucovance for many years  with variable control Also appears to have had minimal diabetes education and has been monitoring his blood sugars only sporadically Review of his A1c shows he has had levels of at least 8% since 2009 and high levels in the last 2 years. On his regimen of glyburide/metformin and low dose Januvia his blood sugars were fluctuating significantly ranging from 80-200+ and also occasional symptoms of hypoglycemia His glyburide was stopped because of significant variability in blood sugars and tendency to hypoglycemia.  Also his Januvia was increased from 25 mg daily therapeutic dose of 100 mg. He was started on low-dose Amaryl on his  visit in 3/15    Office Visit on 04/01/2018  Component Date Value Ref Range Status  . Hemoglobin A1C 04/01/2018 8.0* 4.0 - 5.6 % Final  . Color, Urine 04/01/2018 YELLOW  Yellow;Lt. Yellow Final  . APPearance 04/01/2018 CLEAR  Clear Final  . Specific Gravity, Urine 04/01/2018 1.010  1.000 - 1.030 Final  .  pH 04/01/2018 5.5  5.0 - 8.0 Final  . Total Protein, Urine 04/01/2018 NEGATIVE  Negative Final  . Urine Glucose 04/01/2018 >=1000* Negative Final  . Ketones, ur 04/01/2018 NEGATIVE  Negative Final  . Bilirubin Urine 04/01/2018 NEGATIVE  Negative Final  . Hgb urine dipstick 04/01/2018 NEGATIVE  Negative Final  . Urobilinogen, UA 04/01/2018 0.2  0.0 - 1.0 Final  . Leukocytes, UA 04/01/2018 NEGATIVE  Negative Final  . Nitrite 04/01/2018 NEGATIVE  Negative Final  . WBC, UA 04/01/2018 0-2/hpf  0-2/hpf Final  . RBC / HPF 04/01/2018 none seen  0-2/hpf Final  . Sodium 04/01/2018 138  135 - 145 mEq/L Final  . Potassium 04/01/2018 4.0  3.5 - 5.1 mEq/L Final  . Chloride 04/01/2018 103  96 - 112 mEq/L Final  . CO2  04/01/2018 26  19 - 32 mEq/L Final  . Glucose, Bld 04/01/2018 175* 70 - 99 mg/dL Final  . BUN 04/01/2018 16  6 - 23 mg/dL Final  . Creatinine, Ser 04/01/2018 0.96  0.40 - 1.50 mg/dL Final  . Calcium 04/01/2018 9.3  8.4 - 10.5 mg/dL Final  . GFR 04/01/2018 82.98  >60.00 mL/min Final     Allergies as of 04/01/2018   No Known Allergies     Medication List        Accurate as of 04/01/18  9:31 PM. Always use your most recent med list.          aspirin 81 MG tablet Take 81 mg by mouth at bedtime.   canagliflozin 100 MG Tabs tablet Commonly known as:  INVOKANA Take 1 tablet (100 mg total) by mouth daily before breakfast. 1 tablet before breakfast   glimepiride 1 MG tablet Commonly known as:  AMARYL TAKE 1 TABLET (1 MG TOTAL) BY MOUTH DAILY WITH BREAKFAST.   glucose blood test strip USE TO CHECK ONCE DAILY- Dx code- E11.65   ibuprofen 200 MG tablet Commonly known as:  ADVIL,MOTRIN 3 tabs w/ meals every 8 hours as needed   imiquimod 5 % cream Commonly known as:  ALDARA APPLY ON THE SKIN AS DIRECTED. APPLY TO SCALP MONDAY,WEDNESDAY,FRIDAY FOR 4WEEKS   JANUMET XR 50-1000 MG Tb24 Generic drug:  SitaGLIPtin-MetFORMIN HCl TAKE 2 TABLETS ONCE DAILY   latanoprost 0.005 % ophthalmic solution Commonly known as:  XALATAN   mometasone 50 MCG/ACT nasal spray Commonly known as:  NASONEX Place 2 sprays into the nose daily. 2 puffs twice daily   ONE TOUCH ULTRA SYSTEM KIT w/Device Kit Use to test once daily   ONETOUCH DELICA LANCETS FINE Misc USE TO TEST ONCE DAILY Dx E11.65   simvastatin 20 MG tablet Commonly known as:  ZOCOR TAKE 1 TABLET BY MOUTH ONCE DAILY       Allergies: No Known Allergies  Past Medical History:  Diagnosis Date  . Actinic keratosis   . Obesity, unspecified   . Other and unspecified hyperlipidemia   . Type II or unspecified type diabetes mellitus without mention of complication, not stated as uncontrolled   . Unspecified essential hypertension       Past Surgical History:  Procedure Laterality Date  . ROTATOR CUFF REPAIR      Family History  Problem Relation Age of Onset  . COPD Father   . Diabetes Mother   . Cancer Brother   . Thyroid cancer Brother   . Colon cancer Neg Hx     Social History:  reports that he quit smoking about 19 years ago. His smoking use included  cigars. He has a 72.00 pack-year smoking history. He has never used smokeless tobacco. He reports that he does not drink alcohol or use drugs.    Review of Systems       Hypercholesterolemia:    Currently taking simvastatin 20 mg with adequate control His LDL is excellent, HDL low normal as before   Lab Results  Component Value Date   CHOL 124 12/26/2017   HDL 39.20 12/26/2017   LDLCALC 64 12/26/2017   TRIG 108.0 12/26/2017   CHOLHDL 3 12/26/2017    The blood pressure has been normal without pharmacological treatment   He is followed  regularly by his ophthalmologist for glaucoma every quarter  Last foot exam: 9/19    Physical Examination:  BP 140/80   Ht '5\' 8"'  (1.727 m)   Wt 180 lb (81.6 kg)   SpO2 96%   BMI 27.37 kg/m        Diabetic Foot Exam - Simple   Simple Foot Form Diabetic Foot exam was performed with the following findings:  Yes   Visual Inspection No deformities, no ulcerations, no other skin breakdown bilaterally:  Yes Sensation Testing Intact to touch and monofilament testing bilaterally:  Yes Pulse Check Posterior Tibialis and Dorsalis pulse intact bilaterally:  Yes Comments     ASSESSMENT:  Diabetes type 2 without significant obesity  See history of present illness for details  of current diabetes management, blood sugar patterns and problems identified   Although his A1c is still high at 8% it is not improving  As discussed above he has been very unmotivated to lose weight, watch his diet formal exercise and also not to check his blood sugars much except a few readings in the mornings He may still be  having some high postprandial readings but not clear Also previously has had tendency to low normal readings with Amaryl after supper at times based on his intake  Recommendations: To improve his blood sugars he will need to try taking half tablet Invokana in the evening also He will try to keep his fluid intake significant since he has previous history of UTI Check baseline urinalysis today Recheck renal function He needs to start walking or doing other exercise he likely basketball regularly His diet will be adjusted and his wife is going to help him cut back on portions in the evenings More consistent monitoring of blood sugar at various times   Patient Instructions  Invokana 2x daily  Check blood sugars on waking up  2-3  Also check blood sugars about 2 hours after a meal and do this after different meals by rotation  Recommended blood sugar levels on waking up is 90-130 and about 2 hours after meal is 130-160  Please bring your blood sugar monitor to each visit, thank you        Elayne Snare    Addendum: Lab glucose 175, Urinalysis normal  04/01/2018, 9:31 PM

## 2018-04-01 NOTE — Patient Instructions (Signed)
Invokana 2x daily  Check blood sugars on waking up  2-3  Also check blood sugars about 2 hours after a meal and do this after different meals by rotation  Recommended blood sugar levels on waking up is 90-130 and about 2 hours after meal is 130-160  Please bring your blood sugar monitor to each visit, thank you

## 2018-04-14 ENCOUNTER — Other Ambulatory Visit: Payer: Self-pay | Admitting: Endocrinology

## 2018-05-26 DIAGNOSIS — D485 Neoplasm of uncertain behavior of skin: Secondary | ICD-10-CM | POA: Diagnosis not present

## 2018-05-26 DIAGNOSIS — L570 Actinic keratosis: Secondary | ICD-10-CM | POA: Diagnosis not present

## 2018-05-27 ENCOUNTER — Other Ambulatory Visit: Payer: Self-pay

## 2018-06-07 ENCOUNTER — Other Ambulatory Visit: Payer: Self-pay | Admitting: Endocrinology

## 2018-06-20 ENCOUNTER — Other Ambulatory Visit (INDEPENDENT_AMBULATORY_CARE_PROVIDER_SITE_OTHER): Payer: Medicare Other

## 2018-06-20 DIAGNOSIS — E1165 Type 2 diabetes mellitus with hyperglycemia: Secondary | ICD-10-CM | POA: Diagnosis not present

## 2018-06-20 LAB — COMPREHENSIVE METABOLIC PANEL
ALT: 15 U/L (ref 0–53)
AST: 14 U/L (ref 0–37)
Albumin: 4.3 g/dL (ref 3.5–5.2)
Alkaline Phosphatase: 57 U/L (ref 39–117)
BILIRUBIN TOTAL: 0.6 mg/dL (ref 0.2–1.2)
BUN: 17 mg/dL (ref 6–23)
CO2: 26 meq/L (ref 19–32)
CREATININE: 0.99 mg/dL (ref 0.40–1.50)
Calcium: 9.3 mg/dL (ref 8.4–10.5)
Chloride: 102 mEq/L (ref 96–112)
GFR: 80.03 mL/min (ref >60.00)
GLUCOSE: 151 mg/dL — AB (ref 70–99)
Potassium: 4.2 mEq/L (ref 3.5–5.1)
Sodium: 136 mEq/L (ref 135–145)
Total Protein: 6.9 g/dL (ref 6.0–8.3)

## 2018-06-20 LAB — URINALYSIS, ROUTINE W REFLEX MICROSCOPIC
BILIRUBIN URINE: NEGATIVE
HGB URINE DIPSTICK: NEGATIVE
Ketones, ur: NEGATIVE
Leukocytes, UA: NEGATIVE
NITRITE: NEGATIVE
RBC / HPF: NONE SEEN (ref 0–?)
Specific Gravity, Urine: <=1.005 — AB (ref 1.000–1.030)
Total Protein, Urine: NEGATIVE
Urine Glucose: >=1000 — AB
Urobilinogen, UA: 0.2 (ref 0.0–1.0)
pH: 5.5 (ref 5.0–8.0)

## 2018-06-20 LAB — HEMOGLOBIN A1C: Hgb A1c MFr Bld: 8.5 % — ABNORMAL HIGH (ref 4.6–6.5)

## 2018-06-24 ENCOUNTER — Ambulatory Visit (INDEPENDENT_AMBULATORY_CARE_PROVIDER_SITE_OTHER): Payer: Medicare Other | Admitting: Endocrinology

## 2018-06-24 ENCOUNTER — Encounter: Payer: Self-pay | Admitting: Endocrinology

## 2018-06-24 VITALS — BP 122/84 | HR 76 | Ht 68.0 in | Wt 179.4 lb

## 2018-06-24 DIAGNOSIS — E1165 Type 2 diabetes mellitus with hyperglycemia: Secondary | ICD-10-CM | POA: Diagnosis not present

## 2018-06-24 NOTE — Progress Notes (Signed)
Patient ID: [name] Little, male   DOB: 1951/02/12, 67 y.o.   MRN: [id number]   Reason for Appointment : Followup for Type 2 Diabetes   History of Present Illness          Diagnosis: Type 2 diabetes mellitus, date of diagnosis: 1994   Recent history:   Oral hypoglycemic drugs the patient is taking are: Janumet XR 50/1000, Invokana 50 mg twice daily, Amaryl 0.5 mg at supper  He tends to have relatively high A1c compared to his home blood sugar average  A1c is higher at 8.5, previously 8.0  Current blood sugar patterns, management and problems identified:  He was advised to add another half tablet of Invokana in the evening at dinnertime  Previously had been concerned about possible UTI from this  He has had no side effects from this  However his blood sugars are higher in the morning compared to previous visit  He is checking blood sugars mostly before breakfast and bedtime  Sometimes blood sugars may be as low as 91 at bedtime and 80 during the night with his taking Amaryl at suppertime  However fasting readings are mostly over 140  No hypoglycemia during the day  He says he has not been exercising even though he has some exercise equipment at home  His wife thinks that his portions are large     Glucose monitoring:  less than 1 times daily        Glucometer: One Touch  Blood Glucose readings from download as above   PRE-MEAL Fasting Lunch Dinner Bedtime Overall  Glucose range:  121-180  120-184  89  91-202   Mean/median: 161    136 157   POST-MEAL PC Breakfast PC Lunch PC Dinner  Glucose range:    As above  Mean/median:       Self-care: Avoiding drinks with sugar. Breakfast: oatmeal or egg/toast  at 8, dinner 6 pm         Dietician visit: Most recent: Several years ago        Side effects from medications have been none     Wt Readings from Last 3 Encounters:  06/24/18 179 lb 6.4 oz (81.4 kg)  04/01/18 180 lb (81.6 kg)  01/01/18 171 lb 3.2  oz (77.7 kg)   Lab Results  Component Value Date   HGBA1C 8.5 (H) 06/20/2018   HGBA1C 8.0 (A) 04/01/2018   HGBA1C 8.0 (H) 12/26/2017   Lab Results  Component Value Date   MICROALBUR <0.7 12/26/2017   LDLCALC 64 12/26/2017   CREATININE 0.99 06/20/2018    Lab Results  Component Value Date   FRUCTOSAMINE 309 (H) 12/26/2017   FRUCTOSAMINE 315 (H) 10/21/2013   FRUCTOSAMINE 320 (H) 06/16/2013    Past history: He has been treated for his diabetes with Glucovance for many years  with variable control Also appears to have had minimal diabetes education and has been monitoring his blood sugars only sporadically Review of his A1c shows he has had levels of at least 8% since 2009 and high levels in the last 2 years. On his regimen of glyburide/metformin and low dose Januvia his blood sugars were fluctuating significantly ranging from 80-200+ and also occasional symptoms of hypoglycemia His glyburide was stopped because of significant variability in blood sugars and tendency to hypoglycemia.  Also his Januvia was increased from 25 mg daily therapeutic dose of 100 mg. He was started on low-dose Amaryl on his  visit in 3/15  Lab on 06/20/2018  Component Date Value Ref Range Status  . Color, Urine 06/20/2018 YELLOW  Yellow;Lt. Yellow Final  . APPearance 06/20/2018 CLEAR  Clear Final  . Specific Gravity, Urine 06/20/2018 <=1.005* 1.000 - 1.030 Final  . pH 06/20/2018 5.5  5.0 - 8.0 Final  . Total Protein, Urine 06/20/2018 NEGATIVE  Negative Final  . Urine Glucose 06/20/2018 >=1000* Negative Final  . Ketones, ur 06/20/2018 NEGATIVE  Negative Final  . Bilirubin Urine 06/20/2018 NEGATIVE  Negative Final  . Hgb urine dipstick 06/20/2018 NEGATIVE  Negative Final  . Urobilinogen, UA 06/20/2018 0.2  0.0 - 1.0 Final  . Leukocytes, UA 06/20/2018 NEGATIVE  Negative Final  . Nitrite 06/20/2018 NEGATIVE  Negative Final  . WBC, UA 06/20/2018 0-2/hpf  0-2/hpf Final  . RBC / HPF 06/20/2018 none seen   0-2/hpf Final  . Squamous Epithelial / LPF 06/20/2018 Rare(0-4/hpf)  Rare(0-4/hpf) Final  . Hyaline Casts, UA 06/20/2018 Presence of* None Final  . Sodium 06/20/2018 136  135 - 145 mEq/L Final  . Potassium 06/20/2018 4.2  3.5 - 5.1 mEq/L Final  . Chloride 06/20/2018 102  96 - 112 mEq/L Final  . CO2 06/20/2018 26  19 - 32 mEq/L Final  . Glucose, Bld 06/20/2018 151* 70 - 99 mg/dL Final  . BUN 06/20/2018 17  6 - 23 mg/dL Final  . Creatinine, Ser 06/20/2018 0.99  0.40 - 1.50 mg/dL Final  . Total Bilirubin 06/20/2018 0.6  0.2 - 1.2 mg/dL Final  . Alkaline Phosphatase 06/20/2018 57  39 - 117 U/L Final  . AST 06/20/2018 14  0 - 37 U/L Final  . ALT 06/20/2018 15  0 - 53 U/L Final  . Total Protein 06/20/2018 6.9  6.0 - 8.3 g/dL Final  . Albumin 06/20/2018 4.3  3.5 - 5.2 g/dL Final  . Calcium 06/20/2018 9.3  8.4 - 10.5 mg/dL Final  . GFR 06/20/2018 80.03  >60.00 mL/min Final  . Hgb A1c MFr Bld 06/20/2018 8.5* 4.6 - 6.5 % Final   Glycemic Control Guidelines for People with Diabetes:Non Diabetic:  <6%Goal of Therapy: <7%Additional Action Suggested:  >8%      Allergies as of 06/24/2018   No Known Allergies     Medication List       Accurate as of June 24, 2018  8:42 AM. Always use your most recent med list.        aspirin 81 MG tablet Take 81 mg by mouth at bedtime.   canagliflozin 100 MG Tabs tablet Commonly known as:  INVOKANA Take 1 tablet (100 mg total) by mouth daily before breakfast. 1 tablet before breakfast   glimepiride 1 MG tablet Commonly known as:  AMARYL TAKE 1 TABLET (1 MG TOTAL) BY MOUTH DAILY WITH BREAKFAST.   glucose blood test strip Commonly known as:  ONE TOUCH ULTRA TEST USE TO CHECK ONCE DAILY- Dx code- E11.65   ibuprofen 200 MG tablet Commonly known as:  ADVIL,MOTRIN 3 tabs w/ meals every 8 hours as needed   imiquimod 5 % cream Commonly known as:  ALDARA APPLY ON THE SKIN AS DIRECTED. APPLY TO SCALP MONDAY,WEDNESDAY,FRIDAY FOR 4WEEKS   JANUMET XR  50-1000 MG Tb24 Generic drug:  SitaGLIPtin-MetFORMIN HCl TAKE 2 TABLETS ONCE DAILY   latanoprost 0.005 % ophthalmic solution Commonly known as:  XALATAN   mometasone 50 MCG/ACT nasal spray Commonly known as:  NASONEX Place 2 sprays into the nose daily. 2 puffs twice daily   ONE TOUCH ULTRA SYSTEM KIT w/Device Kit  Use to test once daily   ONETOUCH DELICA LANCETS FINE Misc USE TO TEST ONCE DAILY Dx E11.65   simvastatin 20 MG tablet Commonly known as:  ZOCOR TAKE 1 TABLET BY MOUTH ONCE DAILY       Allergies: No Known Allergies  Past Medical History:  Diagnosis Date  . Actinic keratosis   . Obesity, unspecified   . Other and unspecified hyperlipidemia   . Type II or unspecified type diabetes mellitus without mention of complication, not stated as uncontrolled   . Unspecified essential hypertension     Past Surgical History:  Procedure Laterality Date  . ROTATOR CUFF REPAIR      Family History  Problem Relation Age of Onset  . COPD Father   . Diabetes Mother   . Cancer Brother   . Thyroid cancer Brother   . Colon cancer Neg Hx     Social History:  reports that he quit smoking about 19 years ago. His smoking use included cigars. He has a 72.00 pack-year smoking history. He has never used smokeless tobacco. He reports that he does not drink alcohol or use drugs.    Review of Systems       Hypercholesterolemia:    Currently taking simvastatin 20 mg with adequate control of LDL Liver function is normal  Lab Results  Component Value Date   CHOL 124 12/26/2017   HDL 39.20 12/26/2017   LDLCALC 64 12/26/2017   TRIG 108.0 12/26/2017   CHOLHDL 3 12/26/2017    The blood pressure has been normal without pharmacological treatment   He is followed  regularly by his ophthalmologist for glaucoma every quarter  Last foot exam: 9/19    Physical Examination:  BP 122/84 (BP Location: Left Arm, Patient Position: Sitting, Cuff Size: Normal)   Pulse 76   Ht _0   (1.727 m)   Wt 179 lb 6.4 oz (81.4 kg)   SpO2 97%   BMI 27.28 kg/m         ASSESSMENT:  Diabetes type 2 with BMI 27  See history of present illness for details  of current diabetes management, blood sugar patterns and problems identified   His A1c has gone up to 8.5%  As before his A1c is higher than expected for his home blood sugar average of 146 Since his highest readings are before breakfast and his Amaryl tends to cause low normal readings late at night he would be better controlled with basal insulin He is already taking Janumet and Invokana Apparently has a family history of medullary thyroid cancer and cannot take GLP-1 drugs  Recommendations: Patient refuses to consider insulin Discussed how insulin works, basal insulin properties and also given him basic information on treatment with insulin for diabetes However he is reluctant to start this now For now we will change his Amaryl to bedtime and take all his Invokana 100 mg at suppertime He will start exercise and watch his diet and follow-up in 3 months   There are no Patient Instructions on file for this visit.     Elayne Snare   06/24/2018, 8:42 AM

## 2018-06-24 NOTE — Patient Instructions (Signed)
Glimeperide 1/2 at bedtime  Invokana 1 at supper  More exercie  Watch portions

## 2018-07-28 DIAGNOSIS — Z85828 Personal history of other malignant neoplasm of skin: Secondary | ICD-10-CM | POA: Diagnosis not present

## 2018-07-28 DIAGNOSIS — L819 Disorder of pigmentation, unspecified: Secondary | ICD-10-CM | POA: Diagnosis not present

## 2018-07-28 DIAGNOSIS — L570 Actinic keratosis: Secondary | ICD-10-CM | POA: Diagnosis not present

## 2018-07-28 DIAGNOSIS — C4442 Squamous cell carcinoma of skin of scalp and neck: Secondary | ICD-10-CM | POA: Diagnosis not present

## 2018-07-28 DIAGNOSIS — L821 Other seborrheic keratosis: Secondary | ICD-10-CM | POA: Diagnosis not present

## 2018-07-28 DIAGNOSIS — D229 Melanocytic nevi, unspecified: Secondary | ICD-10-CM | POA: Diagnosis not present

## 2018-07-28 DIAGNOSIS — D485 Neoplasm of uncertain behavior of skin: Secondary | ICD-10-CM | POA: Diagnosis not present

## 2018-07-28 DIAGNOSIS — D1801 Hemangioma of skin and subcutaneous tissue: Secondary | ICD-10-CM | POA: Diagnosis not present

## 2018-07-28 DIAGNOSIS — L814 Other melanin hyperpigmentation: Secondary | ICD-10-CM | POA: Diagnosis not present

## 2018-08-08 ENCOUNTER — Other Ambulatory Visit: Payer: Self-pay | Admitting: Endocrinology

## 2018-08-10 ENCOUNTER — Other Ambulatory Visit: Payer: Self-pay | Admitting: Endocrinology

## 2018-08-12 DIAGNOSIS — H4720 Unspecified optic atrophy: Secondary | ICD-10-CM | POA: Diagnosis not present

## 2018-08-12 DIAGNOSIS — H401131 Primary open-angle glaucoma, bilateral, mild stage: Secondary | ICD-10-CM | POA: Diagnosis not present

## 2018-09-02 DIAGNOSIS — J209 Acute bronchitis, unspecified: Secondary | ICD-10-CM | POA: Diagnosis not present

## 2018-09-02 DIAGNOSIS — J0100 Acute maxillary sinusitis, unspecified: Secondary | ICD-10-CM | POA: Diagnosis not present

## 2018-09-19 ENCOUNTER — Other Ambulatory Visit: Payer: Self-pay | Admitting: Endocrinology

## 2018-09-25 ENCOUNTER — Other Ambulatory Visit: Payer: Self-pay

## 2018-09-25 ENCOUNTER — Other Ambulatory Visit (INDEPENDENT_AMBULATORY_CARE_PROVIDER_SITE_OTHER): Payer: Medicare Other

## 2018-09-25 DIAGNOSIS — E1165 Type 2 diabetes mellitus with hyperglycemia: Secondary | ICD-10-CM | POA: Diagnosis not present

## 2018-09-25 LAB — HEMOGLOBIN A1C: HEMOGLOBIN A1C: 8.4 % — AB (ref 4.6–6.5)

## 2018-09-25 LAB — COMPREHENSIVE METABOLIC PANEL
ALT: 17 U/L (ref 0–53)
AST: 16 U/L (ref 0–37)
Albumin: 4.3 g/dL (ref 3.5–5.2)
Alkaline Phosphatase: 56 U/L (ref 39–117)
BUN: 21 mg/dL (ref 6–23)
CO2: 29 mEq/L (ref 19–32)
CREATININE: 1.03 mg/dL (ref 0.40–1.50)
Calcium: 9.2 mg/dL (ref 8.4–10.5)
Chloride: 103 mEq/L (ref 96–112)
GFR: 71.88 mL/min (ref >60.00)
Glucose, Bld: 143 mg/dL — ABNORMAL HIGH (ref 70–99)
Potassium: 4 mEq/L (ref 3.5–5.1)
Sodium: 138 mEq/L (ref 135–145)
Total Bilirubin: 0.7 mg/dL (ref 0.2–1.2)
Total Protein: 6.7 g/dL (ref 6.0–8.3)

## 2018-09-30 ENCOUNTER — Encounter: Payer: Self-pay | Admitting: Endocrinology

## 2018-09-30 ENCOUNTER — Other Ambulatory Visit: Payer: Self-pay

## 2018-09-30 ENCOUNTER — Ambulatory Visit (INDEPENDENT_AMBULATORY_CARE_PROVIDER_SITE_OTHER): Payer: Medicare Other | Admitting: Endocrinology

## 2018-09-30 VITALS — BP 130/72 | HR 76 | Temp 97.6°F | Ht 68.0 in | Wt 178.6 lb

## 2018-09-30 DIAGNOSIS — E1165 Type 2 diabetes mellitus with hyperglycemia: Secondary | ICD-10-CM | POA: Diagnosis not present

## 2018-09-30 MED ORDER — SIMVASTATIN 20 MG PO TABS: 20.0000 mg | ORAL_TABLET | Freq: Every day | ORAL | 1 refills | Status: DC

## 2018-09-30 MED ORDER — SITAGLIP PHOS-METFORMIN HCL ER 50-1000 MG PO TB24: 2.0000 | ORAL_TABLET | Freq: Every day | ORAL | 1 refills | Status: DC

## 2018-09-30 MED ORDER — GLIMEPIRIDE 1 MG PO TABS: ORAL_TABLET | ORAL | 1 refills | Status: DC

## 2018-09-30 MED ORDER — GLUCOSE BLOOD VI STRP: ORAL_STRIP | 1 refills | Status: DC

## 2018-09-30 MED ORDER — ONETOUCH DELICA LANCETS 30G MISC: 1.0000 | Freq: Every day | 1 refills | Status: DC

## 2018-09-30 MED ORDER — CANAGLIFLOZIN 100 MG PO TABS: ORAL_TABLET | ORAL | 1 refills | Status: DC

## 2018-09-30 NOTE — Progress Notes (Signed)
Patient ID: [name] Little, male   DOB: 1951/02/22, 68 y.o.   MRN: [id number]   Reason for Appointment : Followup for Type 2 Diabetes   History of Present Illness          Diagnosis: Type 2 diabetes mellitus, date of diagnosis: 1994   Recent history:   Oral hypoglycemic drugs the patient is taking are: Janumet XR 50/1000, Invokana 158m, Amaryl 0.5 mg at hs  He tends to have relatively high A1c compared to his home blood sugar average  A1c is still relatively high at 8.4, previously 8.5    Current blood sugar patterns, management and problems identified:  He appears to have more consistently better readings fasting compared to his last visit  However he has not been exercising with walking or other activities as much this winter  Also about a month ago he got prednisone for 5 days for his bronchitis causing his blood sugar to be as high as 252  However overall not checking blood sugars much at all and mostly in the morning  Has only 1 reading recently after supper which was fairly good  Currently taking Janumet and Invokana at dinnertime and Amaryl at bedtime  So far has not had any recurrence of UTI  He has been told to cut back on his portions, weight is about the same     Glucose monitoring:  less than 1 times daily        Glucometer: One Touch  Blood Glucose readings from download    PRE-MEAL Fasting Lunch Dinner Bedtime Overall  Glucose range:  79-152    141, 252   Mean/median:  137     141   PREVIOUS readings:   PRE-MEAL Fasting Lunch Dinner Bedtime Overall  Glucose range:  121-180  120-184  89  91-202   Mean/median: 161    136 157   POST-MEAL PC Breakfast PC Lunch PC Dinner  Glucose range:    As above  Mean/median:       Self-care: Avoiding drinks with sugar. Breakfast: oatmeal or egg/toast  at 8, dinner 6 pm         Dietician visit: Most recent: Several years ago        Side effects from medications have been none     Wt Readings  from Last 3 Encounters:  09/30/18 178 lb 9.6 oz (81 kg)  06/24/18 179 lb 6.4 oz (81.4 kg)  04/01/18 180 lb (81.6 kg)   Lab Results  Component Value Date   HGBA1C 8.4 (H) 09/25/2018   HGBA1C 8.5 (H) 06/20/2018   HGBA1C 8.0 (A) 04/01/2018   Lab Results  Component Value Date   MICROALBUR <0.7 12/26/2017   LDLCALC 64 12/26/2017   CREATININE 1.03 09/25/2018    Lab Results  Component Value Date   FRUCTOSAMINE 309 (H) 12/26/2017   FRUCTOSAMINE 315 (H) 10/21/2013   FRUCTOSAMINE 320 (H) 06/16/2013    Past history: He has been treated for his diabetes with Glucovance for many years  with variable control Also appears to have had minimal diabetes education and has been monitoring his blood sugars only sporadically Review of his A1c shows he has had levels of at least 8% since 2009 and high levels in the last 2 years. On his regimen of glyburide/metformin and low dose Januvia his blood sugars were fluctuating significantly ranging from 80-200+ and also occasional symptoms of hypoglycemia His glyburide was stopped because of significant variability in blood sugars and tendency to hypoglycemia.  Also his Januvia was increased from 25 mg daily therapeutic dose of 100 mg. He was started on low-dose Amaryl on his  visit in 3/15    Appointment on 09/25/2018  Component Date Value Ref Range Status  . Sodium 09/25/2018 138  135 - 145 mEq/L Final  . Potassium 09/25/2018 4.0  3.5 - 5.1 mEq/L Final  . Chloride 09/25/2018 103  96 - 112 mEq/L Final  . CO2 09/25/2018 29  19 - 32 mEq/L Final  . Glucose, Bld 09/25/2018 143* 70 - 99 mg/dL Final  . BUN 09/25/2018 21  6 - 23 mg/dL Final  . Creatinine, Ser 09/25/2018 1.03  0.40 - 1.50 mg/dL Final  . Total Bilirubin 09/25/2018 0.7  0.2 - 1.2 mg/dL Final  . Alkaline Phosphatase 09/25/2018 56  39 - 117 U/L Final  . AST 09/25/2018 16  0 - 37 U/L Final  . ALT 09/25/2018 17  0 - 53 U/L Final  . Total Protein 09/25/2018 6.7  6.0 - 8.3 g/dL Final  . Albumin  09/25/2018 4.3  3.5 - 5.2 g/dL Final  . Calcium 09/25/2018 9.2  8.4 - 10.5 mg/dL Final  . GFR 09/25/2018 71.88  >60.00 mL/min Final  . Hgb A1c MFr Bld 09/25/2018 8.4* 4.6 - 6.5 % Final   Glycemic Control Guidelines for People with Diabetes:Non Diabetic:  <6%Goal of Therapy: <7%Additional Action Suggested:  >8%      Allergies as of 09/30/2018   No Known Allergies     Medication List       Accurate as of September 30, 2018  9:18 AM. Always use your most recent med list.        aspirin 81 MG tablet Take 81 mg by mouth at bedtime.   canagliflozin 100 MG Tabs tablet Commonly known as:  Invokana Take 1/2 tablet by mouth in the morning and at night.   glimepiride 1 MG tablet Commonly known as:  AMARYL TAKE 1 TABLET (1 MG TOTAL) BY MOUTH DAILY WITH BREAKFAST.   glucose blood test strip Commonly known as:  ONE TOUCH ULTRA TEST USE TO CHECK ONCE DAILY- Dx code- E11.65   ibuprofen 200 MG tablet Commonly known as:  ADVIL,MOTRIN 3 tabs w/ meals every 8 hours as needed   imiquimod 5 % cream Commonly known as:  ALDARA APPLY ON THE SKIN AS DIRECTED. APPLY TO SCALP MONDAY,WEDNESDAY,FRIDAY FOR 4WEEKS   Janumet XR 50-1000 MG Tb24 Generic drug:  SitaGLIPtin-MetFORMIN HCl TAKE 2 TABLETS ONCE DAILY   latanoprost 0.005 % ophthalmic solution Commonly known as:  XALATAN   mometasone 50 MCG/ACT nasal spray Commonly known as:  Nasonex Place 2 sprays into the nose daily. 2 puffs twice daily   ONE TOUCH ULTRA SYSTEM KIT w/Device Kit Use to test once daily   OneTouch Delica Lancets 54Y Misc USE TO TEST ONCE DAILY DX E11.65   simvastatin 20 MG tablet Commonly known as:  ZOCOR TAKE 1 TABLET BY MOUTH ONCE DAILY       Allergies: No Known Allergies  Past Medical History:  Diagnosis Date  . Actinic keratosis   . Obesity, unspecified   . Other and unspecified hyperlipidemia   . Type II or unspecified type diabetes mellitus without mention of complication, not stated as uncontrolled    . Unspecified essential hypertension     Past Surgical History:  Procedure Laterality Date  . ROTATOR CUFF REPAIR      Family History  Problem Relation Age of Onset  . COPD Father   .  Diabetes Mother   . Cancer Brother   . Thyroid cancer Brother   . Colon cancer Neg Hx     Social History:  reports that he quit smoking about 20 years ago. His smoking use included cigars. He has a 72.00 pack-year smoking history. He has never used smokeless tobacco. He reports that he does not drink alcohol or use drugs.    Review of Systems       Hypercholesterolemia:    Currently taking simvastatin 20 mg with adequate control of LDL Follows up with PCP annually in June  Lab Results  Component Value Date   CHOL 124 12/26/2017   HDL 39.20 12/26/2017   LDLCALC 64 12/26/2017   TRIG 108.0 12/26/2017   CHOLHDL 3 12/26/2017    The blood pressure has been normal without drug treatment   He is followed  regularly by his ophthalmologist for glaucoma every quarter  Last foot exam: 9/19    Physical Examination:  BP 130/72 (BP Location: Left Arm, Patient Position: Sitting, Cuff Size: Normal)   Pulse 76   Temp 97.6 F (36.4 C) (Oral)   Ht 5' 8" (1.727 m)   Wt 178 lb 9.6 oz (81 kg)   SpO2 98%   BMI 27.16 kg/m         ASSESSMENT:  Diabetes type 2 with BMI 27  See history of present illness for details  of current diabetes management, blood sugar patterns and problems identified   His A1c has gone up over 8% and now 8.4%  However his A1c is higher than expected for his blood sugar readings and his lowest A1c has only been 7.3 Previously fructosamine was just over 300 Currently he can do better with improving his blood sugar with more regular exercise which he is not doing Without adequate postprandial monitoring not clear if he is having consistently high readings after meals or if he needs to change his diet significantly  Recommendations: No change in medication More  consistent glucose monitoring after meals Start an activity program for exercise on the days he is not working Follow-up in 3 months   Patient Instructions  Check blood sugars on waking up 3 days a week  Also check blood sugars about 2 hours after meals and do this after different meals by rotation  Recommended blood sugar levels on waking up are 90-130 and about 2 hours after meal is 130-160  Please bring your blood sugar monitor to each visit, thank you  Walk daily      Elayne Snare   09/30/2018, 9:18 AM

## 2018-09-30 NOTE — Patient Instructions (Addendum)
Check blood sugars on waking up 3 days a week  Also check blood sugars about 2 hours after meals and do this after different meals by rotation  Recommended blood sugar levels on waking up are 90-130 and about 2 hours after meal is 130-160  Please bring your blood sugar monitor to each visit, thank you  Walk daily 

## 2018-10-06 ENCOUNTER — Other Ambulatory Visit: Payer: Self-pay | Admitting: Endocrinology

## 2018-12-15 ENCOUNTER — Other Ambulatory Visit: Payer: Self-pay

## 2018-12-15 MED ORDER — ONETOUCH DELICA LANCETS 30G MISC: 1.0000 | Freq: Every day | 1 refills | Status: DC

## 2018-12-30 ENCOUNTER — Ambulatory Visit (INDEPENDENT_AMBULATORY_CARE_PROVIDER_SITE_OTHER): Payer: Medicare Other | Admitting: Endocrinology

## 2018-12-30 ENCOUNTER — Encounter: Payer: Self-pay | Admitting: Endocrinology

## 2018-12-30 ENCOUNTER — Other Ambulatory Visit: Payer: Self-pay

## 2018-12-30 VITALS — BP 142/68 | HR 74 | Ht 68.0 in | Wt 178.6 lb

## 2018-12-30 DIAGNOSIS — E1165 Type 2 diabetes mellitus with hyperglycemia: Secondary | ICD-10-CM | POA: Diagnosis not present

## 2018-12-30 LAB — LIPID PANEL
Cholesterol: 117 mg/dL (ref 0–200)
HDL: 43.9 mg/dL (ref >39.00)
LDL Cholesterol: 58 mg/dL (ref 0–99)
NonHDL: 72.83
Total CHOL/HDL Ratio: 3
Triglycerides: 74 mg/dL (ref 0.0–149.0)
VLDL: 14.8 mg/dL (ref 0.0–40.0)

## 2018-12-30 LAB — BASIC METABOLIC PANEL
BUN: 24 mg/dL — ABNORMAL HIGH (ref 6–23)
CO2: 26 mEq/L (ref 19–32)
Calcium: 9.2 mg/dL (ref 8.4–10.5)
Chloride: 102 mEq/L (ref 96–112)
Creatinine, Ser: 1 mg/dL (ref 0.40–1.50)
GFR: 74.32 mL/min (ref >60.00)
Glucose, Bld: 182 mg/dL — ABNORMAL HIGH (ref 70–99)
Potassium: 4.3 mEq/L (ref 3.5–5.1)
Sodium: 136 mEq/L (ref 135–145)

## 2018-12-30 LAB — MICROALBUMIN / CREATININE URINE RATIO
Creatinine,U: 49.6 mg/dL
Microalb Creat Ratio: 1.4 mg/g (ref 0.0–30.0)
Microalb, Ur: <0.7 mg/dL (ref 0.0–1.9)

## 2018-12-30 LAB — POCT GLYCOSYLATED HEMOGLOBIN (HGB A1C): Hemoglobin A1C: 8.2 % — AB (ref 4.0–5.6)

## 2018-12-30 NOTE — Progress Notes (Signed)
ae

## 2018-12-30 NOTE — Progress Notes (Signed)
Patient ID: [name] Little, male   DOB: [birth year], 68 y.o.   MRN: [id number]   Reason for Appointment : Followup for Type 2 Diabetes   History of Present Illness          Diagnosis: Type 2 diabetes mellitus, date of diagnosis: 1994   Recent history:   Oral hypoglycemic drugs the patient is taking are: Janumet XR 50/1000, Invokana 165m, Amaryl 0.5 mg at hs  He tends to have relatively high A1c compared to his home blood sugar average  A1c is still relatively high at 8.2 compared to 8.4 previously    Current blood sugar patterns, management and problems identified:  He has not checked his blood sugars much at all and only 8 times in the last month  He is supposed to be taking his Invokana at dinnertime but he is only taking a half a tablet at bedtime  He says that when he was more active about 5 or 6 weeks ago his blood sugars were lower but these records are not available  Now his fasting blood sugars are averaging 167 and mostly high  He has 2 readings at night at bedtime and these are fairly good  Also no hypoglycemia with Amaryl 0.5 mg  His weight is about the same     Glucose monitoring:  less than 1 times daily        Glucometer: One Touch  Blood Glucose readings from download    PRE-MEAL Fasting Lunch Dinner Bedtime Overall  Glucose range:  133-207    72, 103   Mean/median:  168     156   Previous-readings:   PRE-MEAL Fasting Lunch Dinner Bedtime Overall  Glucose range:  79-152    141, 252   Mean/median:  137     141      Self-care: Avoiding drinks with sugar. Breakfast: oatmeal or egg/toast  at 8, dinner 6 pm         Dietician visit: Most recent: Several years ago        Side effects from medications have been none     Wt Readings from Last 3 Encounters:  12/30/18 178 lb 9.6 oz (81 kg)  09/30/18 178 lb 9.6 oz (81 kg)  06/24/18 179 lb 6.4 oz (81.4 kg)   Lab Results  Component Value Date   HGBA1C 8.2 (A) 12/30/2018   HGBA1C 8.4  (H) 09/25/2018   HGBA1C 8.5 (H) 06/20/2018   Lab Results  Component Value Date   MICROALBUR <0.7 12/26/2017   LDLCALC 64 12/26/2017   CREATININE 1.03 09/25/2018    Lab Results  Component Value Date   FRUCTOSAMINE 309 (H) 12/26/2017   FRUCTOSAMINE 315 (H) 10/21/2013   FRUCTOSAMINE 320 (H) 06/16/2013    Past history: He has been treated for his diabetes with Glucovance for many years  with variable control Also appears to have had minimal diabetes education and has been monitoring his blood sugars only sporadically Review of his A1c shows he has had levels of at least 8% since 2009 and high levels in the last 2 years. On his regimen of glyburide/metformin and low dose Januvia his blood sugars were fluctuating significantly ranging from 80-200+ and also occasional symptoms of hypoglycemia His glyburide was stopped because of significant variability in blood sugars and tendency to hypoglycemia.  Also his Januvia was increased from 25 mg daily therapeutic dose of 100 mg. He was started on low-dose Amaryl on his  visit in 3/15  Office Visit on 12/30/2018  Component Date Value Ref Range Status  . Hemoglobin A1C 12/30/2018 8.2* 4.0 - 5.6 % Final     Allergies as of 12/30/2018   No Known Allergies     Medication List       Accurate as of December 30, 2018 10:11 AM. If you have any questions, ask your nurse or doctor.        aspirin 81 MG tablet Take 81 mg by mouth at bedtime.   canagliflozin 100 MG Tabs tablet Commonly known as: Invokana Take 1/2 tablet by mouth in the morning and at night.   glimepiride 1 MG tablet Commonly known as: AMARYL TAKE 1 TABLET (1 MG TOTAL) BY MOUTH DAILY WITH BREAKFAST. What changed: additional instructions   glucose blood test strip Commonly known as: ONE TOUCH ULTRA TEST USE TO CHECK ONCE DAILY- Dx code- E11.65   ibuprofen 200 MG tablet Commonly known as: ADVIL 3 tabs w/ meals every 8 hours as needed   imiquimod 5 % cream Commonly  known as: ALDARA APPLY ON THE SKIN AS DIRECTED. APPLY TO SCALP MONDAY,WEDNESDAY,FRIDAY FOR 4WEEKS   latanoprost 0.005 % ophthalmic solution Commonly known as: XALATAN   mometasone 50 MCG/ACT nasal spray Commonly known as: Nasonex Place 2 sprays into the nose daily. 2 puffs twice daily   ONE TOUCH ULTRA SYSTEM KIT w/Device Kit Use to test once daily   OneTouch Delica Lancets 30G Misc 1 each by Other route daily.   simvastatin 20 MG tablet Commonly known as: ZOCOR Take 1 tablet (20 mg total) by mouth daily.   SitaGLIPtin-MetFORMIN HCl 50-1000 MG Tb24 Commonly known as: Janumet XR Take 2 tablets by mouth daily. What changed: additional instructions       Allergies: No Known Allergies  Past Medical History:  Diagnosis Date  . Actinic keratosis   . Obesity, unspecified   . Other and unspecified hyperlipidemia   . Type II or unspecified type diabetes mellitus without mention of complication, not stated as uncontrolled   . Unspecified essential hypertension     Past Surgical History:  Procedure Laterality Date  . ROTATOR CUFF REPAIR      Family History  Problem Relation Age of Onset  . COPD Father   . Diabetes Mother   . Cancer Brother   . Thyroid cancer Brother   . Colon cancer Neg Hx     Social History:  reports that he quit smoking about 20 years ago. His smoking use included cigars. He has a 72.00 pack-year smoking history. He has never used smokeless tobacco. He reports that he does not drink alcohol or use drugs.    Review of Systems       Hypercholesterolemia:    Currently taking simvastatin 20 mg with adequate control of LDL No recent labs available however  Lab Results  Component Value Date   CHOL 124 12/26/2017   HDL 39.20 12/26/2017   LDLCALC 64 12/26/2017   TRIG 108.0 12/26/2017   CHOLHDL 3 12/26/2017    The blood pressure has been usually normal without any treatment   He is followed  regularly by his ophthalmologist for glaucoma every  quarter  Last foot exam: 9/19    Physical Examination:  BP (!) 142/68 (BP Location: Left Arm, Patient Position: Sitting, Cuff Size: Normal)   Pulse 74   Ht 5' 8" (1.727 m)   Wt 178 lb 9.6 oz (81 kg)   SpO2 96%   BMI 27.16 kg/m           ASSESSMENT:  Diabetes type 2 with BMI 27  See history of present illness for details  of current diabetes management, blood sugar patterns and problems identified   His A1c has gone up over 8% persistently and now 8.2  Previously his lowest A1c has only been 7.3  However his average blood sugar in the morning is 168 His activity level is pretty low and he thinks that when he exercises his blood sugars are much better Also as discussed above he does not check his sugars much His wife is not present today and not clear if he is watching his diet  Recommendations: Needs to take the full dose of Invokana before dinnertime Start riding his bike daily after 30 minutes Start checking blood sugars consistently Discussed need to check blood sugars by rotation at different times of the day including after meals Check labs today including lipids Follow-up in 2 months  Patient Instructions  Start bicycling daily  Invokana full tab before supper  Glimeperide 1/2 at bedtime        Ajay Kumar   12/30/2018, 10:11 AM   

## 2018-12-30 NOTE — Patient Instructions (Addendum)
Start bicycling daily  Invokana full tab before supper  Glimeperide 1/2 at bedtime

## 2019-01-26 DIAGNOSIS — L570 Actinic keratosis: Secondary | ICD-10-CM | POA: Diagnosis not present

## 2019-01-26 DIAGNOSIS — D229 Melanocytic nevi, unspecified: Secondary | ICD-10-CM | POA: Diagnosis not present

## 2019-01-26 DIAGNOSIS — D1801 Hemangioma of skin and subcutaneous tissue: Secondary | ICD-10-CM | POA: Diagnosis not present

## 2019-01-26 DIAGNOSIS — L819 Disorder of pigmentation, unspecified: Secondary | ICD-10-CM | POA: Diagnosis not present

## 2019-01-26 DIAGNOSIS — L821 Other seborrheic keratosis: Secondary | ICD-10-CM | POA: Diagnosis not present

## 2019-01-26 DIAGNOSIS — Z85828 Personal history of other malignant neoplasm of skin: Secondary | ICD-10-CM | POA: Diagnosis not present

## 2019-02-26 DIAGNOSIS — H524 Presbyopia: Secondary | ICD-10-CM | POA: Diagnosis not present

## 2019-03-02 ENCOUNTER — Other Ambulatory Visit: Payer: Self-pay

## 2019-03-02 ENCOUNTER — Encounter: Payer: Self-pay | Admitting: Endocrinology

## 2019-03-02 ENCOUNTER — Ambulatory Visit (INDEPENDENT_AMBULATORY_CARE_PROVIDER_SITE_OTHER): Payer: Medicare Other | Admitting: Endocrinology

## 2019-03-02 VITALS — BP 132/74 | HR 72 | Ht 68.0 in | Wt 175.0 lb

## 2019-03-02 DIAGNOSIS — E1165 Type 2 diabetes mellitus with hyperglycemia: Secondary | ICD-10-CM | POA: Diagnosis not present

## 2019-03-02 LAB — BASIC METABOLIC PANEL
BUN: 20 mg/dL (ref 6–23)
CO2: 27 mEq/L (ref 19–32)
Calcium: 9.5 mg/dL (ref 8.4–10.5)
Chloride: 103 mEq/L (ref 96–112)
Creatinine, Ser: 0.97 mg/dL (ref 0.40–1.50)
GFR: 76.94 mL/min (ref >60.00)
Glucose, Bld: 132 mg/dL — ABNORMAL HIGH (ref 70–99)
Potassium: 4 mEq/L (ref 3.5–5.1)
Sodium: 138 mEq/L (ref 135–145)

## 2019-03-02 NOTE — Progress Notes (Signed)
Patient ID: [name] Little, male   DOB: 02-19-1951, 68 y.o.   MRN: [id number]   Reason for Appointment : Followup for Type 2 Diabetes   History of Present Illness          Diagnosis: Type 2 diabetes mellitus, date of diagnosis: 1994   Recent history:   Oral hypoglycemic drugs the patient is taking are: Janumet XR 50/1000, Invokana 12m, Amaryl 0.5 mg at hs  He tends to have relatively high A1c compared to his home blood sugar average  A1c is on the last visit at 8.2 compared to 8.4 previously    Current blood sugar patterns, management and problems identified:  He has checked his sugars a little more frequently as requested since his last visit but still has only 15 readings in the last month  Hyperglycemia appears to be mostly after breakfast and he appears to be eating a high-fat meal in the morning  He has not been seen by dietitian and does not appear to have a meal plan  Although he has checked readings at all times they appear to be still relatively higher in the mornings at times  He is trying to be a little more active overall and is planning to start some walking biking  His Invokana was increased to the full tablet and he has no urinary symptoms with this  His metformin dosage is at the maximum of 2000 mg a day and renal function has been able previously  Also no hypoglycemia with Amaryl 0.5 mg, lowest blood sugar 79  His weight is about 3 pounds less  Bike walk    Glucose monitoring:  less than 1 times daily        Glucometer: One Touch   Blood Glucose readings from download    PRE-MEAL Fasting Lunch Dinner  overnight Overall  Glucose range:  114-158   153  91  79-201  Mean/median:      138   POST-MEAL PC Breakfast PC Lunch PC Dinner  Glucose range:  140-201   79-144  Mean/median:      PREVIOUS readings:  PRE-MEAL Fasting Lunch Dinner Bedtime Overall  Glucose range:  133-207    72, 103   Mean/median:  168     156      Self-care:  Avoiding drinks with sugar. Breakfast: oatmeal or egg/toast, sometimes will have croissant meat sandwich       Dietician visit: Most recent: Several years ago        Side effects from medications have been none     Wt Readings from Last 3 Encounters:  03/02/19 175 lb (79.4 kg)  12/30/18 178 lb 9.6 oz (81 kg)  09/30/18 178 lb 9.6 oz (81 kg)   Lab Results  Component Value Date   HGBA1C 8.2 (A) 12/30/2018   HGBA1C 8.4 (H) 09/25/2018   HGBA1C 8.5 (H) 06/20/2018   Lab Results  Component Value Date   MICROALBUR <0.7 12/30/2018   LDLCALC 58 12/30/2018   CREATININE 1.00 12/30/2018    Lab Results  Component Value Date   FRUCTOSAMINE 309 (H) 12/26/2017   FRUCTOSAMINE 315 (H) 10/21/2013   FRUCTOSAMINE 320 (H) 06/16/2013    Past history: He has been treated for his diabetes with Glucovance for many years  with variable control Also appears to have had minimal diabetes education and has been monitoring his blood sugars only sporadically Review of his A1c shows he has had levels of at least 8% since 2009 and high  levels in the last 2 years. On his regimen of glyburide/metformin and low dose Januvia his blood sugars were fluctuating significantly ranging from 80-200+ and also occasional symptoms of hypoglycemia His glyburide was stopped because of significant variability in blood sugars and tendency to hypoglycemia.  Also his Januvia was increased from 25 mg daily therapeutic dose of 100 mg. He was started on low-dose Amaryl on his  visit in 3/15    No visits with results within 1 Week(s) from this visit.  Latest known visit with results is:  Office Visit on 12/30/2018  Component Date Value Ref Range Status  . Hemoglobin A1C 12/30/2018 8.2* 4.0 - 5.6 % Final  . Cholesterol 12/30/2018 117  0 - 200 mg/dL Final   ATP III Classification       Desirable:  < 200 mg/dL               Borderline High:  200 - 239 mg/dL          High:  > = 240 mg/dL  . Triglycerides 12/30/2018 74.0  0.0 -  149.0 mg/dL Final   Normal:  <150 mg/dLBorderline High:  150 - 199 mg/dL  . HDL 12/30/2018 43.90  >39.00 mg/dL Final  . VLDL 12/30/2018 14.8  0.0 - 40.0 mg/dL Final  . LDL Cholesterol 12/30/2018 58  0 - 99 mg/dL Final  . Total CHOL/HDL Ratio 12/30/2018 3   Final                  Men          Women1/2 Average Risk     3.4          3.3Average Risk          5.0          4.42X Average Risk          9.6          7.13X Average Risk          15.0          11.0                      . NonHDL 12/30/2018 72.83   Final   NOTE:  Non-HDL goal should be 30 mg/dL higher than patient's LDL goal (i.e. LDL goal of < 70 mg/dL, would have non-HDL goal of < 100 mg/dL)  . Sodium 12/30/2018 136  135 - 145 mEq/L Final  . Potassium 12/30/2018 4.3  3.5 - 5.1 mEq/L Final  . Chloride 12/30/2018 102  96 - 112 mEq/L Final  . CO2 12/30/2018 26  19 - 32 mEq/L Final  . Glucose, Bld 12/30/2018 182* 70 - 99 mg/dL Final  . BUN 12/30/2018 24* 6 - 23 mg/dL Final  . Creatinine, Ser 12/30/2018 1.00  0.40 - 1.50 mg/dL Final  . Calcium 12/30/2018 9.2  8.4 - 10.5 mg/dL Final  . GFR 12/30/2018 74.32  >60.00 mL/min Final  . Microalb, Ur 12/30/2018 <0.7  0.0 - 1.9 mg/dL Final  . Creatinine,U 12/30/2018 49.6  mg/dL Final  . Microalb Creat Ratio 12/30/2018 1.4  0.0 - 30.0 mg/g Final     Allergies as of 03/02/2019   No Known Allergies     Medication List       Accurate as of March 02, 2019  8:34 AM. If you have any questions, ask your nurse or doctor.        aspirin 81 MG tablet Take 81 mg  by mouth at bedtime.   canagliflozin 100 MG Tabs tablet Commonly known as: Invokana Take 1/2 tablet by mouth in the morning and at night.   glimepiride 1 MG tablet Commonly known as: AMARYL Take 1 mg by mouth daily. Take 1 tablet by mouth once daily in the evening. What changed: Another medication with the same name was removed. Continue taking this medication, and follow the directions you see here. Changed by: Elayne Snare, MD    glucose blood test strip Commonly known as: ONE TOUCH ULTRA TEST USE TO CHECK ONCE DAILY- Dx code- E11.65   ibuprofen 200 MG tablet Commonly known as: ADVIL 3 tabs w/ meals every 8 hours as needed   imiquimod 5 % cream Commonly known as: ALDARA APPLY ON THE SKIN AS DIRECTED. APPLY TO SCALP MONDAY,WEDNESDAY,FRIDAY FOR 4WEEKS   latanoprost 0.005 % ophthalmic solution Commonly known as: XALATAN   mometasone 50 MCG/ACT nasal spray Commonly known as: Nasonex Place 2 sprays into the nose daily. 2 puffs twice daily   ONE TOUCH ULTRA SYSTEM KIT w/Device Kit Use to test once daily   OneTouch Delica Lancets 34H Misc 1 each by Other route daily.   simvastatin 20 MG tablet Commonly known as: ZOCOR Take 1 tablet (20 mg total) by mouth daily.   SitaGLIPtin-MetFORMIN HCl 50-1000 MG Tb24 Commonly known as: Janumet XR Take 2 tablets by mouth daily. What changed: additional instructions       Allergies: No Known Allergies  Past Medical History:  Diagnosis Date  . Actinic keratosis   . Obesity, unspecified   . Other and unspecified hyperlipidemia   . Type II or unspecified type diabetes mellitus without mention of complication, not stated as uncontrolled   . Unspecified essential hypertension     Past Surgical History:  Procedure Laterality Date  . ROTATOR CUFF REPAIR      Family History  Problem Relation Age of Onset  . COPD Father   . Diabetes Mother   . Cancer Brother   . Thyroid cancer Brother   . Colon cancer Neg Hx     Social History:  reports that he quit smoking about 20 years ago. His smoking use included cigars. He has a 72.00 pack-year smoking history. He has never used smokeless tobacco. He reports that he does not drink alcohol or use drugs.    Review of Systems       Hypercholesterolemia:    Currently taking simvastatin 20 mg with adequate control of LDL    Lab Results  Component Value Date   CHOL 117 12/30/2018   HDL 43.90 12/30/2018   LDLCALC  58 12/30/2018   TRIG 74.0 12/30/2018   CHOLHDL 3 12/30/2018    The blood pressure has been usually normal without any medications   He is followed  regularly by his ophthalmologist for glaucoma every quarter  Last foot exam: 9/19    Physical Examination:  BP 132/74 (BP Location: Left Arm, Patient Position: Sitting, Cuff Size: Normal)   Pulse 72   Ht 5' 8" (1.727 m)   Wt 175 lb (79.4 kg)   SpO2 96%   BMI 26.61 kg/m         ASSESSMENT:  Diabetes type 2 non-insulin-dependent  See history of present illness for details  of current diabetes management, blood sugar patterns and problems identified   His A1c has been up over 8% persistently and last 8.2  Fructosamine not available  His overall blood sugars are appearing better Also morning sugars are  not as consistently high He has lost 3 pounds He is trying to do a little better with being more active  However as discussed above his diet can be better  Recommendations: Labs to be checked today More consistent monitoring at different times of the day Increase regular exercise Follow-up in 3 months unless blood sugars are starting to go up  There are no Patient Instructions on file for this visit.     Elayne Snare   03/02/2019, 8:34 AM

## 2019-03-02 NOTE — Patient Instructions (Addendum)
Low fat meals and look at fat content at meals  Check blood sugars on waking up days a week  Also check blood sugars about 2 hours after meals and do this after different meals by rotation  Recommended blood sugar levels on waking up are 90-130 and about 2 hours after meal is 130-160  Please bring your blood sugar monitor to each visit, thank you

## 2019-03-03 LAB — FRUCTOSAMINE: Fructosamine: 327 umol/L — ABNORMAL HIGH (ref 0–285)

## 2019-03-12 ENCOUNTER — Other Ambulatory Visit: Payer: Self-pay | Admitting: Endocrinology

## 2019-03-19 ENCOUNTER — Other Ambulatory Visit: Payer: Self-pay

## 2019-03-19 MED ORDER — GLIMEPIRIDE 1 MG PO TABS: ORAL_TABLET | ORAL | 2 refills | Status: DC

## 2019-04-01 ENCOUNTER — Other Ambulatory Visit: Payer: Self-pay | Admitting: Endocrinology

## 2019-04-02 ENCOUNTER — Other Ambulatory Visit: Payer: Self-pay | Admitting: Endocrinology

## 2019-04-21 DIAGNOSIS — Z23 Encounter for immunization: Secondary | ICD-10-CM | POA: Diagnosis not present

## 2019-05-04 ENCOUNTER — Other Ambulatory Visit: Payer: Self-pay | Admitting: Endocrinology

## 2019-05-28 ENCOUNTER — Other Ambulatory Visit: Payer: Self-pay

## 2019-06-01 ENCOUNTER — Ambulatory Visit (INDEPENDENT_AMBULATORY_CARE_PROVIDER_SITE_OTHER): Payer: Medicare Other | Admitting: Endocrinology

## 2019-06-01 ENCOUNTER — Encounter: Payer: Self-pay | Admitting: Endocrinology

## 2019-06-01 ENCOUNTER — Other Ambulatory Visit: Payer: Self-pay

## 2019-06-01 VITALS — BP 136/70 | HR 68 | Ht 68.0 in | Wt 175.6 lb

## 2019-06-01 DIAGNOSIS — E1165 Type 2 diabetes mellitus with hyperglycemia: Secondary | ICD-10-CM | POA: Diagnosis not present

## 2019-06-01 LAB — COMPREHENSIVE METABOLIC PANEL
ALT: 24 U/L (ref 0–53)
AST: 21 U/L (ref 0–37)
Albumin: 4.2 g/dL (ref 3.5–5.2)
Alkaline Phosphatase: 62 U/L (ref 39–117)
BUN: 19 mg/dL (ref 6–23)
CO2: 26 mEq/L (ref 19–32)
Calcium: 9.5 mg/dL (ref 8.4–10.5)
Chloride: 102 mEq/L (ref 96–112)
Creatinine, Ser: 0.92 mg/dL (ref 0.40–1.50)
GFR: 81.72 mL/min (ref >60.00)
Glucose, Bld: 177 mg/dL — ABNORMAL HIGH (ref 70–99)
Potassium: 3.9 mEq/L (ref 3.5–5.1)
Sodium: 138 mEq/L (ref 135–145)
Total Bilirubin: 0.7 mg/dL (ref 0.2–1.2)
Total Protein: 7.4 g/dL (ref 6.0–8.3)

## 2019-06-01 LAB — POCT GLYCOSYLATED HEMOGLOBIN (HGB A1C): Hemoglobin A1C: 7.5 % — AB (ref 4.0–5.6)

## 2019-06-01 NOTE — Progress Notes (Signed)
Please call to let patient know that the labs are okay except blood sugar was 177

## 2019-06-01 NOTE — Progress Notes (Signed)
Patient ID: [name] Little, male   DOB: 05-Jul-1951, 68 y.o.   MRN: [id number]   Reason for Appointment : Followup for Type 2 Diabetes   History of Present Illness          Diagnosis: Type 2 diabetes mellitus, date of diagnosis: 1994   Recent history:   Oral hypoglycemic drugs: Janumet XR 50/1000, 2 tablets at dinner, Invokana 165m before dinner, Amaryl 0.5 mg at hs  He tends to have relatively high A1c compared to his home blood sugar average  A1c is 7.5, relatively better compared to 8.2 Fructosamine tends to be relatively high and was 327 on his last visit  Current blood sugar patterns, management and problems identified:  He has only a few blood sugar readings from the last month and mostly in the mornings  Most of his fasting blood sugars appear to be relatively high  However he did have an episode where he fell staggery during the night at about 2 AM and his blood sugar was 78  Although he has checked one of the readings after supper he has variable results  Overall he has been more active with outdoor biking or yard work and some walking also  Occasionally will have higher size meals with more carbohydrate causing higher readings  Weight is about the same  No side effects with Invokana     Glucose monitoring:  less than 1 times daily        Glucometer: One Touch   Blood Glucose readings from download    PRE-MEAL Fasting Lunch Dinner Bedtime Overall  Glucose range:  138-176  133-170   212   Mean/median:     151   2 AM = 78  Previous readings:  PRE-MEAL Fasting Lunch Dinner  overnight Overall  Glucose range:  114-158   153  91  79-201  Mean/median:      138   POST-MEAL PC Breakfast PC Lunch PC Dinner  Glucose range:  140-201   79-144  Mean/median:        Self-care: Avoiding drinks with sugar. Breakfast: oatmeal or egg/toast, sometimes will have croissant meat sandwich       Dietician visit: Most recent: Several years ago        Side  effects from medications have been none     Wt Readings from Last 3 Encounters:  06/01/19 175 lb 9.6 oz (79.7 kg)  03/02/19 175 lb (79.4 kg)  12/30/18 178 lb 9.6 oz (81 kg)   Lab Results  Component Value Date   HGBA1C 7.5 (A) 06/01/2019   HGBA1C 8.2 (A) 12/30/2018   HGBA1C 8.4 (H) 09/25/2018   Lab Results  Component Value Date   MICROALBUR <0.7 12/30/2018   LDLCALC 58 12/30/2018   CREATININE 0.97 03/02/2019    Lab Results  Component Value Date   FRUCTOSAMINE 327 (H) 03/02/2019   FRUCTOSAMINE 309 (H) 12/26/2017   FRUCTOSAMINE 315 (H) 10/21/2013    Past history: He has been treated for his diabetes with Glucovance for many years  with variable control Also appears to have had minimal diabetes education and has been monitoring his blood sugars only sporadically Review of his A1c shows he has had levels of at least 8% since 2009 and high levels in the last 2 years. On his regimen of glyburide/metformin and low dose Januvia his blood sugars were fluctuating significantly ranging from 80-200+ and also occasional symptoms of hypoglycemia His glyburide was stopped because of significant variability in blood sugars  and tendency to hypoglycemia.  Also his Januvia was increased from 25 mg daily therapeutic dose of 100 mg. He was started on low-dose Amaryl on his  visit in 3/15   Office Visit on 06/01/2019  Component Date Value Ref Range Status  . Hemoglobin A1C 06/01/2019 7.5* 4.0 - 5.6 % Final     Allergies as of 06/01/2019   No Known Allergies     Medication List       Accurate as of June 01, 2019  8:31 AM. If you have any questions, ask your nurse or doctor.        aspirin 81 MG tablet Take 81 mg by mouth at bedtime.   glimepiride 1 MG tablet Commonly known as: AMARYL Take 0.5 tablet by mouth once daily.   ibuprofen 200 MG tablet Commonly known as: ADVIL 3 tabs w/ meals every 8 hours as needed   imiquimod 5 % cream Commonly known as: ALDARA APPLY ON THE  SKIN AS DIRECTED. APPLY TO SCALP MONDAY,WEDNESDAY,FRIDAY FOR 4WEEKS   Invokana 100 MG Tabs tablet Generic drug: canagliflozin TAKE 1/2 TABLET BY MOUTH IN THE MORNING AND AT NIGHT.   latanoprost 0.005 % ophthalmic solution Commonly known as: XALATAN   mometasone 50 MCG/ACT nasal spray Commonly known as: Nasonex Place 2 sprays into the nose daily. 2 puffs twice daily   ONE TOUCH ULTRA SYSTEM KIT w/Device Kit Use to test once daily   OneTouch Delica Lancets 26S Misc 1 each by Other route daily.   OneTouch Ultra test strip Generic drug: glucose blood USE TO CHECK ONCE DAILY- DX CODE- E11.65   simvastatin 20 MG tablet Commonly known as: ZOCOR TAKE 1 TABLET BY MOUTH EVERY DAY   SitaGLIPtin-MetFORMIN HCl 50-1000 MG Tb24 Commonly known as: Janumet XR Take 2 tablets by mouth daily. What changed: additional instructions       Allergies: No Known Allergies  Past Medical History:  Diagnosis Date  . Actinic keratosis   . Obesity, unspecified   . Other and unspecified hyperlipidemia   . Type II or unspecified type diabetes mellitus without mention of complication, not stated as uncontrolled   . Unspecified essential hypertension     Past Surgical History:  Procedure Laterality Date  . ROTATOR CUFF REPAIR      Family History  Problem Relation Age of Onset  . COPD Father   . Diabetes Mother   . Cancer Brother   . Thyroid cancer Brother   . Colon cancer Neg Hx     Social History:  reports that he quit smoking about 20 years ago. His smoking use included cigars. He has a 72.00 pack-year smoking history. He has never used smokeless tobacco. He reports that he does not drink alcohol or use drugs.    Review of Systems       Hypercholesterolemia:    He has been taking simvastatin 20 mg with adequate control of LDL    Lab Results  Component Value Date   CHOL 117 12/30/2018   HDL 43.90 12/30/2018   LDLCALC 58 12/30/2018   TRIG 74.0 12/30/2018   CHOLHDL 3 12/30/2018     The blood pressure has been usually normal without any medications   He is followed  regularly by his ophthalmologist for glaucoma every quarter  Last foot exam: 05/2019    Physical Examination:  BP 136/70 (BP Location: Left Arm, Patient Position: Sitting, Cuff Size: Normal)   Pulse 68   Ht _0  (1.727 m)   Wt 175  lb 9.6 oz (79.7 kg)   SpO2 97%   BMI 26.70 kg/m        Diabetic Foot Exam - Simple   Simple Foot Form Diabetic Foot exam was performed with the following findings: Yes   Visual Inspection No deformities, no ulcerations, no other skin breakdown bilaterally: Yes Sensation Testing Intact to touch and monofilament testing bilaterally: Yes Pulse Check Posterior Tibialis and Dorsalis pulse intact bilaterally: Yes Comments      ASSESSMENT:  Diabetes type 2 non-insulin-dependent  See history of present illness for details  of current diabetes management, blood sugar patterns and problems identified   His A1c has been up over 8% previously and now 7.5  Fructosamine however tends to be over 300 but lower than expected for her A1c also  Compared to his last visit his morning sugars are higher despite improved A1c Blood sugars are likely better during the daytime with his being more active Overall he is trying to watch his portions and carbohydrates  Recommendations: Fructosamine to be checked today More frequent monitoring at different times of the day No change in medications at this time but consider increasing Amaryl at bedtime if morning sugars tend to be higher He is still reluctant to consider basal insulin  There are no Patient Instructions on file for this visit.     Elayne Snare   06/01/2019, 8:31 AM

## 2019-06-19 ENCOUNTER — Other Ambulatory Visit: Payer: Self-pay | Admitting: Endocrinology

## 2019-06-23 ENCOUNTER — Other Ambulatory Visit: Payer: Self-pay | Admitting: Endocrinology

## 2019-08-24 ENCOUNTER — Other Ambulatory Visit: Payer: Self-pay | Admitting: Endocrinology

## 2019-08-24 DIAGNOSIS — E1165 Type 2 diabetes mellitus with hyperglycemia: Secondary | ICD-10-CM

## 2019-08-25 ENCOUNTER — Other Ambulatory Visit: Payer: Self-pay

## 2019-08-25 ENCOUNTER — Other Ambulatory Visit (INDEPENDENT_AMBULATORY_CARE_PROVIDER_SITE_OTHER): Payer: Medicare Other

## 2019-08-25 DIAGNOSIS — E1165 Type 2 diabetes mellitus with hyperglycemia: Secondary | ICD-10-CM

## 2019-08-25 LAB — COMPREHENSIVE METABOLIC PANEL
ALT: 18 U/L (ref 0–53)
AST: 16 U/L (ref 0–37)
Albumin: 4.3 g/dL (ref 3.5–5.2)
Alkaline Phosphatase: 54 U/L (ref 39–117)
BUN: 17 mg/dL (ref 6–23)
CO2: 29 mEq/L (ref 19–32)
Calcium: 9.5 mg/dL (ref 8.4–10.5)
Chloride: 102 mEq/L (ref 96–112)
Creatinine, Ser: 0.92 mg/dL (ref 0.40–1.50)
GFR: 81.66 mL/min (ref >60.00)
Glucose, Bld: 162 mg/dL — ABNORMAL HIGH (ref 70–99)
Potassium: 4.3 mEq/L (ref 3.5–5.1)
Sodium: 137 mEq/L (ref 135–145)
Total Bilirubin: 0.5 mg/dL (ref 0.2–1.2)
Total Protein: 7 g/dL (ref 6.0–8.3)

## 2019-08-25 LAB — HEMOGLOBIN A1C: Hgb A1c MFr Bld: 9.1 % — ABNORMAL HIGH (ref 4.6–6.5)

## 2019-08-31 ENCOUNTER — Other Ambulatory Visit: Payer: Self-pay | Admitting: Endocrinology

## 2019-09-01 ENCOUNTER — Ambulatory Visit (INDEPENDENT_AMBULATORY_CARE_PROVIDER_SITE_OTHER): Payer: Medicare Other | Admitting: Endocrinology

## 2019-09-01 ENCOUNTER — Encounter: Payer: Self-pay | Admitting: Endocrinology

## 2019-09-01 ENCOUNTER — Other Ambulatory Visit: Payer: Self-pay

## 2019-09-01 VITALS — BP 140/62 | HR 74 | Ht 68.0 in | Wt 177.4 lb

## 2019-09-01 DIAGNOSIS — E1165 Type 2 diabetes mellitus with hyperglycemia: Secondary | ICD-10-CM

## 2019-09-01 DIAGNOSIS — E7800 Pure hypercholesterolemia, unspecified: Secondary | ICD-10-CM

## 2019-09-01 MED ORDER — JARDIANCE 10 MG PO TABS: 10.0000 mg | ORAL_TABLET | Freq: Every day | ORAL | 3 refills | Status: DC

## 2019-09-01 NOTE — Patient Instructions (Addendum)
Check blood sugars on waking up 3-4  days a week  Also check blood sugars about 2 hours after meals and do this after different meals by rotation  Recommended blood sugar levels on waking up are 90-130 and about 2 hours after meal is 130-160  Please bring your blood sugar monitor to each visit, thank you  Jardiance 10mg  in am at [facility] daily  Glimeperide full tab at bedtime

## 2019-09-01 NOTE — Progress Notes (Signed)
Patient ID: [name] Little, male   DOB: [birth year], 69 y.o.   MRN: [id number]   Reason for Appointment : Followup for Type 2 Diabetes   History of Present Illness          Diagnosis: Type 2 diabetes mellitus, date of diagnosis: 1994   Recent history:   Oral hypoglycemic drugs: Janumet XR 50/1000, 2 tablets at dinner, Invokana 177m before dinner, Amaryl 0.5 mg at hs  He tends to have relatively high A1c compared to his home blood sugar average  A1c is much higher at 9.1 compared to 7.5  Fructosamine previously was relatively high and was 327 in 02/2009  Current blood sugar patterns, management and problems identified:  He again is checking blood sugar readings only very regularly and mostly in the mornings  Most of his fasting blood sugars are again relatively high  His treatment regimen was not changed on the last visit because of improving A1c and inconsistent blood sugars including readings as low as 78 overnight  He says his blood sugars may be higher because of lack of exercise  He is gaining a little weight also  His insurance is now not covering his Invokana  He does take all his medications regularly  Not clear if he has any higher blood sugars after dinner     Glucose monitoring:  less than 1 times daily        Glucometer: One Touch   Blood Glucose readings from download    PRE-MEAL Fasting Lunch Dinner Bedtime Overall  Glucose range:  129-175  151-173  101    Mean/median:  150    148   POST-MEAL PC Breakfast PC Lunch PC Dinner  Glucose range:   125   Mean/median:       PREVIOUS readings:  PRE-MEAL Fasting Lunch Dinner Bedtime Overall  Glucose range:  138-176  133-170   212   Mean/median:     151   2 AM = 78   Self-care: Avoiding drinks with sugar. Breakfast: oatmeal or egg/toast, sometimes will have croissant meat sandwich       Dietician visit: Most recent: Several years ago        Side effects from medications have been none      Wt Readings from Last 3 Encounters:  09/01/19 177 lb 6.4 oz (80.5 kg)  06/01/19 175 lb 9.6 oz (79.7 kg)  03/02/19 175 lb (79.4 kg)   Lab Results  Component Value Date   HGBA1C 9.1 (H) 08/25/2019   HGBA1C 7.5 (A) 06/01/2019   HGBA1C 8.2 (A) 12/30/2018   Lab Results  Component Value Date   MICROALBUR <0.7 12/30/2018   LEast [place]58 12/30/2018   CREATININE 0.92 08/25/2019    Lab Results  Component Value Date   FRUCTOSAMINE 327 (H) 03/02/2019   FRUCTOSAMINE 309 (H) 12/26/2017   FRUCTOSAMINE 315 (H) 10/21/2013    Past history: He has been treated for his diabetes with Glucovance for many years  with variable control Also appears to have had minimal diabetes education and has been monitoring his blood sugars only sporadically Review of his A1c shows he has had levels of at least 8% since 2009 and high levels in the last 2 years. On his regimen of glyburide/metformin and low dose Januvia his blood sugars were fluctuating significantly ranging from 80-200+ and also occasional symptoms of hypoglycemia His glyburide was stopped because of significant variability in blood sugars and tendency to hypoglycemia.  Also his Januvia was increased from  25 mg daily therapeutic dose of 100 mg. He was started on low-dose Amaryl on his  visit in 3/15   No visits with results within 1 Week(s) from this visit.  Latest known visit with results is:  Lab on 08/25/2019  Component Date Value Ref Range Status  . Sodium 08/25/2019 137  135 - 145 mEq/L Final  . Potassium 08/25/2019 4.3  3.5 - 5.1 mEq/L Final  . Chloride 08/25/2019 102  96 - 112 mEq/L Final  . CO2 08/25/2019 29  19 - 32 mEq/L Final  . Glucose, Bld 08/25/2019 162* 70 - 99 mg/dL Final  . BUN 08/25/2019 17  6 - 23 mg/dL Final  . Creatinine, Ser 08/25/2019 0.92  0.40 - 1.50 mg/dL Final  . Total Bilirubin 08/25/2019 0.5  0.2 - 1.2 mg/dL Final  . Alkaline Phosphatase 08/25/2019 54  39 - 117 U/L Final  . AST 08/25/2019 16  0 - 37 U/L Final   . ALT 08/25/2019 18  0 - 53 U/L Final  . Total Protein 08/25/2019 7.0  6.0 - 8.3 g/dL Final  . Albumin 08/25/2019 4.3  3.5 - 5.2 g/dL Final  . GFR 08/25/2019 81.66  >60.00 mL/min Final  . Calcium 08/25/2019 9.5  8.4 - 10.5 mg/dL Final  . Hgb A1c MFr Bld 08/25/2019 9.1* 4.6 - 6.5 % Final   Glycemic Control Guidelines for People with Diabetes:Non Diabetic:  <6%Goal of Therapy: <7%Additional Action Suggested:  >8%      Allergies as of 09/01/2019   No Known Allergies     Medication List       Accurate as of September 01, 2019  8:51 AM. If you have any questions, ask your nurse or doctor.        aspirin 81 MG tablet Take 81 mg by mouth at bedtime.   glimepiride 1 MG tablet Commonly known as: AMARYL TAKE 1/2 TABLET BY MOUTH ONCE DAILY   imiquimod 5 % cream Commonly known as: ALDARA APPLY ON THE SKIN AS DIRECTED. APPLY TO SCALP MONDAY,WEDNESDAY,FRIDAY FOR 4WEEKS   Invokana 100 MG Tabs tablet Generic drug: canagliflozin Take 100 mg by mouth daily before supper. What changed: Another medication with the same name was removed. Continue taking this medication, and follow the directions you see here. Changed by: Elayne Snare, MD   Janumet XR 50-1000 MG Tb24 Generic drug: SitaGLIPtin-MetFORMIN HCl TAKE 2 TABLETS BY MOUTH EVERY DAY   latanoprost 0.005 % ophthalmic solution Commonly known as: XALATAN   mometasone 50 MCG/ACT nasal spray Commonly known as: Nasonex Place 2 sprays into the nose daily. 2 puffs twice daily   ONE TOUCH ULTRA SYSTEM KIT w/Device Kit Use to test once daily   OneTouch Delica Lancets 50K Misc 1 each by Other route daily.   OneTouch Ultra test strip Generic drug: glucose blood USE TO CHECK ONCE DAILY- DX CODE- E11.65   simvastatin 20 MG tablet Commonly known as: ZOCOR TAKE 1 TABLET BY MOUTH EVERY DAY       Allergies: No Known Allergies  Past Medical History:  Diagnosis Date  . Actinic keratosis   . Obesity, unspecified   . Other and  unspecified hyperlipidemia   . Type II or unspecified type diabetes mellitus without mention of complication, not stated as uncontrolled   . Unspecified essential hypertension     Past Surgical History:  Procedure Laterality Date  . ROTATOR CUFF REPAIR      Family History  Problem Relation Age of Onset  . COPD Father   .  Diabetes Mother   . Cancer Brother   . Thyroid cancer Brother   . Colon cancer Neg Hx     Social History:  reports that he quit smoking about 21 years ago. His smoking use included cigars. He has a 72.00 pack-year smoking history. He has never used smokeless tobacco. He reports that he does not drink alcohol or use drugs.    Review of Systems       Hypercholesterolemia:    He has been taking simvastatin 20 mg with adequate control of LDL    Lab Results  Component Value Date   CHOL 117 12/30/2018   HDL 43.90 12/30/2018   LDLCALC 58 12/30/2018   TRIG 74.0 12/30/2018   CHOLHDL 3 12/30/2018    The blood pressure has been usually normal without any medications, also on SGLT2 drugs   He is followed  regularly by his ophthalmologist for glaucoma regularly but reports are not available  Last foot exam: 05/2019    Physical Examination:  BP 140/62 (BP Location: Left Arm, Patient Position: Sitting, Cuff Size: Normal)   Pulse 74   Ht _0  (1.727 m)   Wt 177 lb 6.4 oz (80.5 kg)   SpO2 96%   BMI 26.97 kg/m         ASSESSMENT:  Diabetes type 2 non-insulin-dependent  See history of present illness for details  of current diabetes management, blood sugar patterns and problems identified   His A1c has gone up significantly to 9.1  Fructosamine previously was over 300  Since he is monitoring blood sugars inadequately difficult to know what his blood sugar patterns are he is now getting mostly high readings fasting Blood sugars are not consistently high at lunch time or in the afternoon but he has very few readings Also not clear if his blood sugars  go up after dinner His weight is up 2 pounds from lack of exercise  Recommendations: Fructosamine to be checked on each visit also He needs to start checking blood sugars after dinner more regularly Increase Amaryl to 1 mg at bedtime Discussed A1c targets He says he can start his stationary bike for exercise now and also other activities when the weather is better He will be given a trial of Jardiance 10 mg daily since insurance will not cover Invokana To take this in the morning   Patient Instructions  Check blood sugars on waking up 3-4  days a week  Also check blood sugars about 2 hours after meals and do this after different meals by rotation  Recommended blood sugar levels on waking up are 90-130 and about 2 hours after meal is 130-160  Please bring your blood sugar monitor to each visit, thank you  [facility]  09/01/2019, 8:51 AM

## 2019-09-04 DIAGNOSIS — H47293 Other optic atrophy, bilateral: Secondary | ICD-10-CM | POA: Diagnosis not present

## 2019-09-04 DIAGNOSIS — H40023 Open angle with borderline findings, high risk, bilateral: Secondary | ICD-10-CM | POA: Diagnosis not present

## 2019-09-04 DIAGNOSIS — E119 Type 2 diabetes mellitus without complications: Secondary | ICD-10-CM | POA: Diagnosis not present

## 2019-09-04 LAB — HM DIABETES EYE EXAM

## 2019-09-25 ENCOUNTER — Other Ambulatory Visit: Payer: Self-pay | Admitting: Endocrinology

## 2019-10-28 ENCOUNTER — Other Ambulatory Visit: Payer: Self-pay | Admitting: Endocrinology

## 2019-11-09 ENCOUNTER — Other Ambulatory Visit: Payer: Self-pay | Admitting: Endocrinology

## 2019-11-27 ENCOUNTER — Other Ambulatory Visit (INDEPENDENT_AMBULATORY_CARE_PROVIDER_SITE_OTHER): Payer: Medicare Other

## 2019-11-27 ENCOUNTER — Other Ambulatory Visit: Payer: Self-pay

## 2019-11-27 DIAGNOSIS — E7800 Pure hypercholesterolemia, unspecified: Secondary | ICD-10-CM | POA: Diagnosis not present

## 2019-11-27 DIAGNOSIS — E1165 Type 2 diabetes mellitus with hyperglycemia: Secondary | ICD-10-CM | POA: Diagnosis not present

## 2019-11-27 LAB — BASIC METABOLIC PANEL
BUN: 20 mg/dL (ref 6–23)
CO2: 27 mEq/L (ref 19–32)
Calcium: 9.2 mg/dL (ref 8.4–10.5)
Chloride: 103 mEq/L (ref 96–112)
Creatinine, Ser: 1.01 mg/dL (ref 0.40–1.50)
GFR: 73.27 mL/min (ref >60.00)
Glucose, Bld: 157 mg/dL — ABNORMAL HIGH (ref 70–99)
Potassium: 4.6 mEq/L (ref 3.5–5.1)
Sodium: 134 mEq/L — ABNORMAL LOW (ref 135–145)

## 2019-11-27 LAB — LIPID PANEL
Cholesterol: 105 mg/dL (ref 0–200)
HDL: 37.1 mg/dL — ABNORMAL LOW (ref >39.00)
LDL Cholesterol: 51 mg/dL (ref 0–99)
NonHDL: 68.16
Total CHOL/HDL Ratio: 3
Triglycerides: 88 mg/dL (ref 0.0–149.0)
VLDL: 17.6 mg/dL (ref 0.0–40.0)

## 2019-11-27 LAB — MICROALBUMIN / CREATININE URINE RATIO
Creatinine,U: 44.9 mg/dL
Microalb Creat Ratio: 1.6 mg/g (ref 0.0–30.0)
Microalb, Ur: <0.7 mg/dL (ref 0.0–1.9)

## 2019-11-27 LAB — HEMOGLOBIN A1C: Hgb A1c MFr Bld: 8.6 % — ABNORMAL HIGH (ref 4.6–6.5)

## 2019-11-28 LAB — FRUCTOSAMINE: Fructosamine: 349 umol/L — ABNORMAL HIGH (ref 0–285)

## 2019-12-01 ENCOUNTER — Ambulatory Visit (INDEPENDENT_AMBULATORY_CARE_PROVIDER_SITE_OTHER): Payer: Medicare Other | Admitting: Endocrinology

## 2019-12-01 ENCOUNTER — Encounter: Payer: Self-pay | Admitting: Endocrinology

## 2019-12-01 ENCOUNTER — Other Ambulatory Visit: Payer: Self-pay

## 2019-12-01 VITALS — BP 130/60 | HR 70 | Ht 68.0 in | Wt 176.0 lb

## 2019-12-01 DIAGNOSIS — E1165 Type 2 diabetes mellitus with hyperglycemia: Secondary | ICD-10-CM | POA: Diagnosis not present

## 2019-12-01 DIAGNOSIS — E7800 Pure hypercholesterolemia, unspecified: Secondary | ICD-10-CM | POA: Diagnosis not present

## 2019-12-01 NOTE — Patient Instructions (Signed)
Check blood sugars on waking up 4 days a week  Also check blood sugars about 2 hours after meals and do this after different meals by rotation  Recommended blood sugar levels on waking up are 90-130 and about 2 hours after meal is 130-180  Please bring your blood sugar monitor to each visit, thank you  Exercise 5 days a weeks  Jardiance just before supper

## 2019-12-01 NOTE — Progress Notes (Signed)
Patient ID: [name] Little, male   DOB: 29-Aug-1950, 69 y.o.   MRN: [id number]   Reason for Appointment : Followup for Type 2 Diabetes   History of Present Illness          Diagnosis: Type 2 diabetes mellitus, date of diagnosis: 1994   Recent history:   Oral hypoglycemic drugs: Janumet XR 50/1000, 2 tablets at dinner, Jardiance 44m before breakfast, Amaryl  1 mg at hs  He tends to have relatively high A1c compared to his home blood sugar average  A1c is slightly better at 8.6 compared to 9.1  Fructosamine is higher at 349 and was 327 in 02/2009  Current blood sugar patterns, management and problems identified:  He says he was forgetting to take his Invokana in the mornings although he was told to continue taking this at suppertime  More recently is on Jardiance because of insurance preference since 11/13/2019  However his blood sugars in the mornings are still fairly consistently high with only couple of good readings around 140  Blood sugars are mildly increased at dinnertime also  He felt a little dizzy during the night last night and blood sugar was 80 otherwise has not had any low sugars with his Amaryl 1 mg dose  Despite reminders has not exercised  Also has not checked readings after dinner usually  His weight is about the same     Glucose monitoring:  less than 1 times daily        Glucometer: One Touch   Blood Glucose readings from download    PRE-MEAL Fasting Lunch  6-7 PM  1 AM Overall  Glucose range:  136-209  152  143-174  80   Mean/median:  172   158   162   POST-MEAL PC Breakfast PC Lunch PC Dinner  Glucose range:  177-187    Mean/median:      Previous readings:  PRE-MEAL Fasting Lunch Dinner Bedtime Overall  Glucose range:  129-175  151-173  101    Mean/median:  150    148   POST-MEAL PC Breakfast PC Lunch PC Dinner  Glucose range:   125   Mean/median:         Self-care: Avoiding drinks with sugar. Breakfast: oatmeal or  egg/toast, sometimes will have croissant meat sandwich       Dietician visit: Most recent: Several years ago        Side effects from medications have been none     Wt Readings from Last 3 Encounters:  12/01/19 176 lb (79.8 kg)  09/01/19 177 lb 6.4 oz (80.5 kg)  06/01/19 175 lb 9.6 oz (79.7 kg)   Lab Results  Component Value Date   HGBA1C 8.6 (H) 11/27/2019   HGBA1C 9.1 (H) 08/25/2019   HGBA1C 7.5 (A) 06/01/2019   Lab Results  Component Value Date   MICROALBUR <0.7 11/27/2019   LDLCALC 51 11/27/2019   CREATININE 1.01 11/27/2019    Lab Results  Component Value Date   FRUCTOSAMINE 349 (H) 11/27/2019   FRUCTOSAMINE 327 (H) 03/02/2019   FRUCTOSAMINE 309 (H) 12/26/2017    Past history: He has been treated for his diabetes with Glucovance for many years  with variable control Also appears to have had minimal diabetes education and has been monitoring his blood sugars only sporadically Review of his A1c shows he has had levels of at least 8% since 2009 and high levels in the last 2 years. On his regimen of glyburide/metformin and low dose  Januvia his blood sugars were fluctuating significantly ranging from 80-200+ and also occasional symptoms of hypoglycemia His glyburide was stopped because of significant variability in blood sugars and tendency to hypoglycemia.  Also his Januvia was increased from 25 mg daily therapeutic dose of 100 mg. He was started on low-dose Amaryl on his  visit in 3/15   Lab on 11/27/2019  Component Date Value Ref Range Status  . Microalb, Ur 11/27/2019 <0.7  0.0 - 1.9 mg/dL Final  . Creatinine,U 11/27/2019 44.9  mg/dL Final  . Microalb Creat Ratio 11/27/2019 1.6  0.0 - 30.0 mg/g Final  . Cholesterol 11/27/2019 105  0 - 200 mg/dL Final   ATP III Classification       Desirable:  < 200 mg/dL               Borderline High:  200 - 239 mg/dL          High:  > = 240 mg/dL  . Triglycerides 11/27/2019 88.0  0.0 - 149.0 mg/dL Final   Normal:  <150  mg/dLBorderline High:  150 - 199 mg/dL  . HDL 11/27/2019 37.10* >39.00 mg/dL Final  . VLDL 11/27/2019 17.6  0.0 - 40.0 mg/dL Final  . LDL Cholesterol 11/27/2019 51  0 - 99 mg/dL Final  . Total CHOL/HDL Ratio 11/27/2019 3   Final                  Men          Women1/2 Average Risk     3.4          3.3Average Risk          5.0          4.42X Average Risk          9.6          7.13X Average Risk          15.0          11.0                      . NonHDL 11/27/2019 68.16   Final   NOTE:  Non-HDL goal should be 30 mg/dL higher than patient's LDL goal (i.e. LDL goal of < 70 mg/dL, would have non-HDL goal of < 100 mg/dL)  . Sodium 11/27/2019 134* 135 - 145 mEq/L Final  . Potassium 11/27/2019 4.6  3.5 - 5.1 mEq/L Final  . Chloride 11/27/2019 103  96 - 112 mEq/L Final  . CO2 11/27/2019 27  19 - 32 mEq/L Final  . Glucose, Bld 11/27/2019 157* 70 - 99 mg/dL Final  . BUN 11/27/2019 20  6 - 23 mg/dL Final  . Creatinine, Ser 11/27/2019 1.01  0.40 - 1.50 mg/dL Final  . GFR 11/27/2019 73.27  >60.00 mL/min Final  . Calcium 11/27/2019 9.2  8.4 - 10.5 mg/dL Final  . Fructosamine 11/27/2019 349* 0 - 285 umol/L Final   Comment: Published reference interval for apparently healthy subjects between age 29 and 89 is 47 - 285 umol/L and in a poorly controlled diabetic population is 228 - 563 umol/L with a mean of 396 umol/L.   Marland Kitchen Hgb A1c MFr Bld 11/27/2019 8.6* 4.6 - 6.5 % Final   Glycemic Control Guidelines for People with Diabetes:Non Diabetic:  <6%Goal of Therapy: <7%Additional Action Suggested:  >8%      Allergies as of 12/01/2019   No Known Allergies     Medication List  Accurate as of Dec 01, 2019 12:39 PM. If you have any questions, ask your nurse or doctor.        aspirin 81 MG tablet Take 81 mg by mouth at bedtime.   glimepiride 1 MG tablet Commonly known as: AMARYL TAKE 1/2 TABLET BY MOUTH ONCE DAILY   imiquimod 5 % cream Commonly known as: ALDARA APPLY ON THE SKIN AS DIRECTED.  APPLY TO SCALP MONDAY,WEDNESDAY,FRIDAY FOR 4WEEKS   Janumet XR 50-1000 MG Tb24 Generic drug: SitaGLIPtin-MetFORMIN HCl TAKE 2 TABLETS BY MOUTH EVERY DAY   Jardiance 10 MG Tabs tablet Generic drug: empagliflozin Take 10 mg by mouth daily with breakfast.   latanoprost 0.005 % ophthalmic solution Commonly known as: XALATAN   mometasone 50 MCG/ACT nasal spray Commonly known as: Nasonex Place 2 sprays into the nose daily. 2 puffs twice daily   ONE TOUCH ULTRA SYSTEM KIT w/Device Kit Use to test once daily   OneTouch Delica Lancets 77L Misc 1 each by Other route daily.   OneTouch Ultra test strip Generic drug: glucose blood USE TO CHECK ONCE DAILY- DX CODE- E11.65   simvastatin 20 MG tablet Commonly known as: ZOCOR TAKE 1 TABLET BY MOUTH EVERY DAY       Allergies: No Known Allergies  Past Medical History:  Diagnosis Date  . Actinic keratosis   . Obesity, unspecified   . Other and unspecified hyperlipidemia   . Type II or unspecified type diabetes mellitus without mention of complication, not stated as uncontrolled   . Unspecified essential hypertension     Past Surgical History:  Procedure Laterality Date  . ROTATOR CUFF REPAIR      Family History  Problem Relation Age of Onset  . COPD Father   . Diabetes Mother   . Cancer Brother   . Thyroid cancer Brother   . Colon cancer Neg Hx     Social History:  reports that he quit smoking about 21 years ago. His smoking use included cigars. He has a 72.00 pack-year smoking history. He has never used smokeless tobacco. He reports that he does not drink alcohol or use drugs.    Review of Systems       Hypercholesterolemia:    He has been taking simvastatin 20 mg with adequate control of LDL    Lab Results  Component Value Date   CHOL 105 11/27/2019   HDL 37.10 (L) 11/27/2019   LDLCALC 51 11/27/2019   TRIG 88.0 11/27/2019   CHOLHDL 3 11/27/2019    The blood pressure has been normal without any medications,  also on SGLT2 drugs   He is followed  regularly by his ophthalmologist for glaucoma regularly  Last foot exam: 05/2019    Physical Examination:  BP 130/60 (BP Location: Left Arm, Patient Position: Sitting, Cuff Size: Normal)   Pulse 70   Ht _0  (1.727 m)   Wt 176 lb (79.8 kg)   SpO2 97%   BMI 26.76 kg/m       No ankle edema present  ASSESSMENT:  Diabetes type 2 non-insulin-dependent  See history of present illness for details  of current diabetes management, blood sugar patterns and problems identified   His A1c is 8.6 although slightly better and fructosamine 349  He still is not monitoring blood sugars enough but seems to have higher fasting readings Also may have mild increase in blood sugars around dinnertime but not monitoring after eating Blood sugars can be better controlled with starting regular exercise program which she  has not been motivated to do Also likely can do better with reducing carbohydrate portions Not clear if his blood sugars are as well controlled with Jardiance compared to [place] and he was also somewhat irregular with taking his Invokana previously  Recommendations: Try taking Jardiance before dinnertime May consider increasing the dose on the next visit Stay on Amaryl 1 mg at bedtime He will either start walking or using his exercise bike for some significant amount of aerobic exercise More consistent monitoring of blood sugars at different times including after dinner Call if blood sugars consistently high or low   Microalbumin is normal  No change in simvastatin as LDL is excellent   Patient Instructions  Check blood sugars on waking up 4 days a week  Also check blood sugars about 2 hours after meals and do this after different meals by rotation  Recommended blood sugar levels on waking up are 90-130 and about 2 hours after meal is 130-180  Please bring your blood sugar monitor to each visit, thank you  Exercise 5 days a  weeks  Jardiance just before supper          Elayne Snare   12/01/2019, 12:39 PM

## 2019-12-16 ENCOUNTER — Other Ambulatory Visit: Payer: Self-pay | Admitting: Endocrinology

## 2019-12-22 ENCOUNTER — Other Ambulatory Visit: Payer: Self-pay | Admitting: Endocrinology

## 2020-01-12 ENCOUNTER — Other Ambulatory Visit: Payer: Self-pay | Admitting: Endocrinology

## 2020-01-27 DIAGNOSIS — L570 Actinic keratosis: Secondary | ICD-10-CM | POA: Diagnosis not present

## 2020-01-27 DIAGNOSIS — L814 Other melanin hyperpigmentation: Secondary | ICD-10-CM | POA: Diagnosis not present

## 2020-01-27 DIAGNOSIS — L578 Other skin changes due to chronic exposure to nonionizing radiation: Secondary | ICD-10-CM | POA: Diagnosis not present

## 2020-03-01 ENCOUNTER — Other Ambulatory Visit: Payer: Self-pay

## 2020-03-01 ENCOUNTER — Other Ambulatory Visit (INDEPENDENT_AMBULATORY_CARE_PROVIDER_SITE_OTHER): Payer: Medicare Other

## 2020-03-01 DIAGNOSIS — E1165 Type 2 diabetes mellitus with hyperglycemia: Secondary | ICD-10-CM

## 2020-03-01 LAB — HEMOGLOBIN A1C: Hgb A1c MFr Bld: 8.6 % — ABNORMAL HIGH (ref 4.6–6.5)

## 2020-03-01 LAB — BASIC METABOLIC PANEL
BUN: 21 mg/dL (ref 6–23)
CO2: 27 mEq/L (ref 19–32)
Calcium: 9.4 mg/dL (ref 8.4–10.5)
Chloride: 103 mEq/L (ref 96–112)
Creatinine, Ser: 1 mg/dL (ref 0.40–1.50)
GFR: 74.06 mL/min (ref >60.00)
Glucose, Bld: 154 mg/dL — ABNORMAL HIGH (ref 70–99)
Potassium: 4 mEq/L (ref 3.5–5.1)
Sodium: 138 mEq/L (ref 135–145)

## 2020-03-03 ENCOUNTER — Other Ambulatory Visit: Payer: Self-pay | Admitting: Endocrinology

## 2020-03-03 DIAGNOSIS — H47293 Other optic atrophy, bilateral: Secondary | ICD-10-CM | POA: Diagnosis not present

## 2020-03-03 DIAGNOSIS — H40023 Open angle with borderline findings, high risk, bilateral: Secondary | ICD-10-CM | POA: Diagnosis not present

## 2020-03-03 DIAGNOSIS — E119 Type 2 diabetes mellitus without complications: Secondary | ICD-10-CM | POA: Diagnosis not present

## 2020-03-03 DIAGNOSIS — H2513 Age-related nuclear cataract, bilateral: Secondary | ICD-10-CM | POA: Diagnosis not present

## 2020-03-03 DIAGNOSIS — H5203 Hypermetropia, bilateral: Secondary | ICD-10-CM | POA: Diagnosis not present

## 2020-03-08 ENCOUNTER — Ambulatory Visit (INDEPENDENT_AMBULATORY_CARE_PROVIDER_SITE_OTHER): Payer: Medicare Other | Admitting: Endocrinology

## 2020-03-08 ENCOUNTER — Other Ambulatory Visit: Payer: Self-pay

## 2020-03-08 VITALS — BP 122/62 | HR 66 | Ht 68.0 in | Wt 172.8 lb

## 2020-03-08 DIAGNOSIS — E1165 Type 2 diabetes mellitus with hyperglycemia: Secondary | ICD-10-CM | POA: Diagnosis not present

## 2020-03-08 MED ORDER — GLIMEPIRIDE 1 MG PO TABS
1.0000 mg | ORAL_TABLET | Freq: Every day | ORAL | 1 refills | Status: DC
Start: 2020-03-08 — End: 2020-04-12

## 2020-03-08 NOTE — Patient Instructions (Addendum)
Check blood sugars on waking up 2-3 days a week  Also check blood sugars about 2 hours after meals and do this after different meals by rotation  Recommended blood sugar levels on waking up are 90-130 and about 2 hours after meal is 130-160  Please bring your blood sugar monitor to each visit, thank you  Glimeperide 1 tab at bedtime  Indoor exercise BIKE 5 / 7 days

## 2020-03-08 NOTE — Progress Notes (Signed)
Patient ID: [name] Little, male   DOB: 1951-03-15, 69 y.o.   MRN: [id number]   Reason for Appointment : Followup for Type 2 Diabetes   History of Present Illness          Diagnosis: Type 2 diabetes mellitus, date of diagnosis: 1994   Recent history:   Oral hypoglycemic drugs: Janumet XR 50/1000, 2 tablets at dinner, Jardiance 58m before dinner, Amaryl  1 mg at hs  He tends to have relatively high A1c compared to his home blood sugar average  A1c is the same at 8.6  Previous fructosamine 349  Current blood sugar patterns, management and problems identified:  He has not checked his readings regularly and has only 6 fasting readings in the last month  Also although he was supposed to take 1 mg Amaryl at bedtime he says he is only taking half tablet  He still does not like to exercise even though he has an exercise bike available  Usually trying to watch his portions and weight is down 4 pounds  No hypoglycemic symptoms  He is taking Jardiance in the evenings, while this may be potentially helping his morning readings  No readings after meals lately     Glucose monitoring:  less than 1 times daily        Glucometer: One Touch   Blood Glucose readings from download    PRE-MEAL Fasting Lunch Dinner Bedtime Overall  Glucose range:  144-188      Mean/median: 168       .  Previous readings:  PRE-MEAL Fasting Lunch  6-7 PM  1 AM Overall  Glucose range:  136-209  152  143-174  80   Mean/median:  172   158   162   POST-MEAL PC Breakfast PC Lunch PC Dinner  Glucose range:  177-187    Mean/median:        Self-care: Avoiding drinks with sugar. Breakfast: oatmeal or egg/toast, sometimes will have croissant meat sandwich       Dietician visit: Most recent: Several years ago        Side effects from medications have been none     Wt Readings from Last 3 Encounters:  03/08/20 172 lb 12.8 oz (78.4 kg)  12/01/19 176 lb (79.8 kg)  09/01/19 177 lb 6.4 oz  (80.5 kg)   Lab Results  Component Value Date   HGBA1C 8.6 (H) 03/01/2020   HGBA1C 8.6 (H) 11/27/2019   HGBA1C 9.1 (H) 08/25/2019   Lab Results  Component Value Date   MICROALBUR <0.7 11/27/2019   LDLCALC 51 11/27/2019   CREATININE 1.00 03/01/2020    Lab Results  Component Value Date   FRUCTOSAMINE 349 (H) 11/27/2019   FRUCTOSAMINE 327 (H) 03/02/2019   FRUCTOSAMINE 309 (H) 12/26/2017    Past history: He has been treated for his diabetes with Glucovance for many years  with variable control Also appears to have had minimal diabetes education and has been monitoring his blood sugars only sporadically Review of his A1c shows he has had levels of at least 8% since 2009 and high levels in the last 2 years. On his regimen of glyburide/metformin and low dose Januvia his blood sugars were fluctuating significantly ranging from 80-200+ and also occasional symptoms of hypoglycemia His glyburide was stopped because of significant variability in blood sugars and tendency to hypoglycemia.  Also his Januvia was increased from 25 mg daily therapeutic dose of 100 mg. He was started on low-dose Amaryl on his  visit in 3/15   No visits with results within 1 Week(s) from this visit.  Latest known visit with results is:  Lab on 03/01/2020  Component Date Value Ref Range Status  . Sodium 03/01/2020 138  135 - 145 mEq/L Final  . Potassium 03/01/2020 4.0  3.5 - 5.1 mEq/L Final  . Chloride 03/01/2020 103  96 - 112 mEq/L Final  . CO2 03/01/2020 27  19 - 32 mEq/L Final  . Glucose, Bld 03/01/2020 154* 70 - 99 mg/dL Final  . BUN 03/01/2020 21  6 - 23 mg/dL Final  . Creatinine, Ser 03/01/2020 1.00  0.40 - 1.50 mg/dL Final  . GFR 03/01/2020 74.06  >60.00 mL/min Final  . Calcium 03/01/2020 9.4  8.4 - 10.5 mg/dL Final  . Hgb A1c MFr Bld 03/01/2020 8.6* 4.6 - 6.5 % Final   Glycemic Control Guidelines for People with Diabetes:Non Diabetic:  <6%Goal of Therapy: <7%Additional Action Suggested:  >8%       Allergies as of 03/08/2020   No Known Allergies     Medication List       Accurate as of March 08, 2020  9:44 AM. If you have any questions, ask your nurse or doctor.        aspirin 81 MG tablet Take 81 mg by mouth at bedtime.   glimepiride 1 MG tablet Commonly known as: AMARYL Take 1 tablet (1 mg total) by mouth daily. What changed: how much to take Changed by: Elayne Snare, MD   imiquimod 5 % cream Commonly known as: ALDARA APPLY ON THE SKIN AS DIRECTED. APPLY TO SCALP MONDAY,WEDNESDAY,FRIDAY FOR 4WEEKS   Janumet XR 50-1000 MG Tb24 Generic drug: SitaGLIPtin-MetFORMIN HCl TAKE 2 TABLETS BY MOUTH EVERY DAY   Jardiance 10 MG Tabs tablet Generic drug: empagliflozin TAKE 1 TABLET BY MOUTH EVERY DAY WITH BREAKFAST   latanoprost 0.005 % ophthalmic solution Commonly known as: XALATAN   mometasone 50 MCG/ACT nasal spray Commonly known as: Nasonex Place 2 sprays into the nose daily. 2 puffs twice daily   ONE TOUCH ULTRA SYSTEM KIT w/Device Kit Use to test once daily   OneTouch Delica Lancets 23F Misc 1 each by Other route daily.   OneTouch Ultra test strip Generic drug: glucose blood USE TO CHECK ONCE DAILY- DX CODE- E11.65   simvastatin 20 MG tablet Commonly known as: ZOCOR TAKE 1 TABLET BY MOUTH EVERY DAY       Allergies: No Known Allergies  Past Medical History:  Diagnosis Date  . Actinic keratosis   . Obesity, unspecified   . Other and unspecified hyperlipidemia   . Type II or unspecified type diabetes mellitus without mention of complication, not stated as uncontrolled   . Unspecified essential hypertension     Past Surgical History:  Procedure Laterality Date  . ROTATOR CUFF REPAIR      Family History  Problem Relation Age of Onset  . COPD Father   . Diabetes Mother   . Cancer Brother   . Thyroid cancer Brother   . Colon cancer Neg Hx     Social History:  reports that he quit smoking about 21 years ago. His smoking use included  cigars. He has a 72.00 pack-year smoking history. He has never used smokeless tobacco. He reports that he does not drink alcohol and does not use drugs.    Review of Systems       Hypercholesterolemia:    He has been taking simvastatin 20 mg with adequate control of LDL  Lab Results  Component Value Date   CHOL 105 11/27/2019   HDL 37.10 (L) 11/27/2019   LDLCALC 51 11/27/2019   TRIG 88.0 11/27/2019   CHOLHDL 3 11/27/2019    The blood pressure has been normal without any medications, also on SGLT2 drugs   He is followed  regularly by his ophthalmologist for glaucoma regularly  Last foot exam: 05/2019    Physical Examination:  BP 122/62 (BP Location: Left Arm, Patient Position: Sitting, Cuff Size: Normal)   Pulse 66   Ht '5\' 8"'  (1.727 m)   Wt 172 lb 12.8 oz (78.4 kg)   SpO2 94%   BMI 26.27 kg/m    Thyroid not palpable  ASSESSMENT:  Diabetes type 2 non-insulin-dependent  See history of present illness for details  of current diabetes management, blood sugar patterns and problems identified   His A1c is 8.6, previous fructosamine 349  He is only monitoring blood sugars fasting which are moderately increased Usually A1c is higher than expected for his readings also Even though he has lost weight he may still be having high readings after meals which he is not checking He thinks his diet is fairly good  Recommendations: Increase Amaryl to 1 mg instead of 0.5 More readings after various meals by rotation regularly He will start using his indoor bike at least 5 days a week  Recommend that he see his PCP for regular physical  Family history of thyroid cancer: We will check TSH on the next visit, currently neck exam is normal  Patient Instructions  Check blood sugars on waking up 2-3 days a week  Also check blood sugars about 2 hours after meals and do this after different meals by rotation  Recommended blood sugar levels on waking up are 90-130 and about 2  hours after meal is 130-160  Please bring your blood sugar monitor to each visit, thank you  Glimeperide 1 tab at bedtime  Indoor exercise BIKE 5 / 7 days      Elayne Snare   03/08/2020, 9:44 AM

## 2020-03-17 ENCOUNTER — Other Ambulatory Visit: Payer: Self-pay | Admitting: Endocrinology

## 2020-03-19 ENCOUNTER — Other Ambulatory Visit: Payer: Self-pay | Admitting: Endocrinology

## 2020-04-12 ENCOUNTER — Other Ambulatory Visit: Payer: Self-pay | Admitting: Endocrinology

## 2020-04-21 DIAGNOSIS — Z23 Encounter for immunization: Secondary | ICD-10-CM | POA: Diagnosis not present

## 2020-05-22 ENCOUNTER — Other Ambulatory Visit: Payer: Self-pay | Admitting: Endocrinology

## 2020-05-31 ENCOUNTER — Other Ambulatory Visit (INDEPENDENT_AMBULATORY_CARE_PROVIDER_SITE_OTHER): Payer: Medicare Other

## 2020-05-31 ENCOUNTER — Other Ambulatory Visit: Payer: Self-pay

## 2020-05-31 DIAGNOSIS — E1165 Type 2 diabetes mellitus with hyperglycemia: Secondary | ICD-10-CM | POA: Diagnosis not present

## 2020-05-31 LAB — BASIC METABOLIC PANEL
BUN: 24 mg/dL — ABNORMAL HIGH (ref 6–23)
CO2: 27 mEq/L (ref 19–32)
Calcium: 9.1 mg/dL (ref 8.4–10.5)
Chloride: 100 mEq/L (ref 96–112)
Creatinine, Ser: 1 mg/dL (ref 0.40–1.50)
GFR: 76.86 mL/min (ref >60.00)
Glucose, Bld: 157 mg/dL — ABNORMAL HIGH (ref 70–99)
Potassium: 4.2 mEq/L (ref 3.5–5.1)
Sodium: 136 mEq/L (ref 135–145)

## 2020-05-31 LAB — HEMOGLOBIN A1C: Hgb A1c MFr Bld: 8.5 % — ABNORMAL HIGH (ref 4.6–6.5)

## 2020-05-31 LAB — TSH: TSH: 3.91 u[IU]/mL (ref 0.35–4.50)

## 2020-06-07 ENCOUNTER — Encounter: Payer: Self-pay | Admitting: Endocrinology

## 2020-06-07 ENCOUNTER — Ambulatory Visit (INDEPENDENT_AMBULATORY_CARE_PROVIDER_SITE_OTHER): Payer: Medicare Other | Admitting: Endocrinology

## 2020-06-07 ENCOUNTER — Other Ambulatory Visit: Payer: Self-pay

## 2020-06-07 VITALS — BP 130/80 | HR 68 | Ht 68.0 in | Wt 177.2 lb

## 2020-06-07 DIAGNOSIS — E1165 Type 2 diabetes mellitus with hyperglycemia: Secondary | ICD-10-CM | POA: Diagnosis not present

## 2020-06-07 LAB — URINALYSIS, ROUTINE W REFLEX MICROSCOPIC
Bilirubin Urine: NEGATIVE
Hgb urine dipstick: NEGATIVE
Ketones, ur: NEGATIVE
Leukocytes,Ua: NEGATIVE
Nitrite: NEGATIVE
RBC / HPF: NONE SEEN (ref 0–?)
Specific Gravity, Urine: 1.01 (ref 1.000–1.030)
Total Protein, Urine: NEGATIVE
Urine Glucose: >=1000 — AB
Urobilinogen, UA: 0.2 (ref 0.0–1.0)
WBC, UA: NONE SEEN (ref 0–?)
pH: 6 (ref 5.0–8.0)

## 2020-06-07 MED ORDER — EMPAGLIFLOZIN 25 MG PO TABS: ORAL_TABLET | ORAL | 2 refills | Status: DC

## 2020-06-07 NOTE — Progress Notes (Signed)
Patient ID: [name] Little, male   DOB: December 10, 1950, 69 y.o.   MRN: [id number]   Reason for Appointment : Followup for Type 2 Diabetes   History of Present Illness          Diagnosis: Type 2 diabetes mellitus, date of diagnosis: 1994   Recent history:   Oral hypoglycemic drugs: Janumet XR 50/1000, 2 tablets at dinner, Jardiance 10 mg before dinner, Amaryl  1 mg at hs  He tends to have relatively high A1c compared to his home blood sugar average  A1c is the same at 8.5  Previous fructosamine 349  Current blood sugar patterns, management and problems identified:  He has not been exercising as directed and has gained back 5 pounds according to his weight today  He was asked to start using his indoor bicycle for exercise but has not done any exercise around  Although he has done few more blood sugars than before usually not checking after dinner  Fasting readings are still relatively high and 157 in the lab  This is despite increasing his AMARYL to 1 mg at night  Also may be getting higher fat meals such as breakfast meats and biscuits  He is apparently snacking more at night  Blood sugars are relatively better before dinnertime  No hypoglycemic symptoms  He is taking Jardiance in the evenings, while this may be potentially helping his morning readings  No readings after meals lately     Glucose monitoring:  less than 1 times daily        Glucometer: One Touch   Blood Glucose readings from download    PRE-MEAL Fasting Lunch Dinner Bedtime Overall  Glucose range:  140, 176  154-188  114, 148    Mean/median:  158  167   154   POST-MEAL PC Breakfast PC Lunch PC Dinner  Glucose range:   188   Mean/median:      Previously:  PRE-MEAL Fasting Lunch Dinner Bedtime Overall  Glucose range:  144-188      Mean/median: 168        Self-care: Avoiding drinks with sugar. Breakfast: oatmeal or egg/toast, sometimes will have croissant meat sandwich        Dietician visit: Most recent: Several years ago        Side effects from medications have been none     Wt Readings from Last 3 Encounters:  06/07/20 177 lb 3.2 oz (80.4 kg)  03/08/20 172 lb 12.8 oz (78.4 kg)  12/01/19 176 lb (79.8 kg)   Lab Results  Component Value Date   HGBA1C 8.5 (H) 05/31/2020   HGBA1C 8.6 (H) 03/01/2020   HGBA1C 8.6 (H) 11/27/2019   Lab Results  Component Value Date   MICROALBUR <0.7 11/27/2019   LDLCALC 51 11/27/2019   CREATININE 1.00 05/31/2020    Lab Results  Component Value Date   FRUCTOSAMINE 349 (H) 11/27/2019   FRUCTOSAMINE 327 (H) 03/02/2019   FRUCTOSAMINE 309 (H) 12/26/2017    Past history: He has been treated for his diabetes with Glucovance for many years  with variable control Also appears to have had minimal diabetes education and has been monitoring his blood sugars only sporadically Review of his A1c shows he has had levels of at least 8% since 2009 and high levels in the last 2 years. On his regimen of glyburide/metformin and low dose Januvia his blood sugars were fluctuating significantly ranging from 80-200+ and also occasional symptoms of hypoglycemia His glyburide was stopped because  of significant variability in blood sugars and tendency to hypoglycemia.  Also his Januvia was increased from 25 mg daily therapeutic dose of 100 mg. He was started on low-dose Amaryl on his  visit in 3/15   No visits with results within 1 Week(s) from this visit.  Latest known visit with results is:  Lab on 05/31/2020  Component Date Value Ref Range Status  . TSH 05/31/2020 3.91  0.35 - 4.50 uIU/mL Final  . Sodium 05/31/2020 136  135 - 145 mEq/L Final  . Potassium 05/31/2020 4.2  3.5 - 5.1 mEq/L Final  . Chloride 05/31/2020 100  96 - 112 mEq/L Final  . CO2 05/31/2020 27  19 - 32 mEq/L Final  . Glucose, Bld 05/31/2020 157* 70 - 99 mg/dL Final  . BUN 05/31/2020 24* 6 - 23 mg/dL Final  . Creatinine, Ser 05/31/2020 1.00  0.40 - 1.50 mg/dL Final   . GFR 05/31/2020 76.86  >60.00 mL/min Final   Calculated using the CKD-EPI Creatinine Equation (2021)  . Calcium 05/31/2020 9.1  8.4 - 10.5 mg/dL Final  . Hgb A1c MFr Bld 05/31/2020 8.5* 4.6 - 6.5 % Final   Glycemic Control Guidelines for People with Diabetes:Non Diabetic:  <6%Goal of Therapy: <7%Additional Action Suggested:  >8%      Allergies as of 06/07/2020   No Known Allergies     Medication List       Accurate as of June 07, 2020  8:31 AM. If you have any questions, ask your nurse or doctor.        aspirin 81 MG tablet Take 81 mg by mouth at bedtime.   glimepiride 1 MG tablet Commonly known as: AMARYL Take 1 tablet (1 mg total) by mouth daily.   imiquimod 5 % cream Commonly known as: ALDARA APPLY ON THE SKIN AS DIRECTED. APPLY TO SCALP MONDAY,WEDNESDAY,FRIDAY FOR 4WEEKS   Janumet XR 50-1000 MG Tb24 Generic drug: SitaGLIPtin-MetFORMIN HCl TAKE 2 TABLETS BY MOUTH EVERY DAY   Jardiance 10 MG Tabs tablet Generic drug: empagliflozin TAKE 1 TABLET BY MOUTH EVERY DAY WITH BREAKFAST   latanoprost 0.005 % ophthalmic solution Commonly known as: XALATAN   mometasone 50 MCG/ACT nasal spray Commonly known as: Nasonex Place 2 sprays into the nose daily. 2 puffs twice daily   ONE TOUCH ULTRA SYSTEM KIT w/Device Kit Use to test once daily   OneTouch Delica Lancets 63Z Misc USE TO TEST ONCE DAILY   OneTouch Ultra test strip Generic drug: glucose blood USE TO CHECK ONCE DAILY- DX CODE- E11.65   simvastatin 20 MG tablet Commonly known as: ZOCOR TAKE 1 TABLET BY MOUTH EVERY DAY       Allergies: No Known Allergies  Past Medical History:  Diagnosis Date  . Actinic keratosis   . Obesity, unspecified   . Other and unspecified hyperlipidemia   . Type II or unspecified type diabetes mellitus without mention of complication, not stated as uncontrolled   . Unspecified essential hypertension     Past Surgical History:  Procedure Laterality Date  . ROTATOR  CUFF REPAIR      Family History  Problem Relation Age of Onset  . COPD Father   . Diabetes Mother   . Cancer Brother   . Thyroid cancer Brother   . Colon cancer Neg Hx     Social History:  reports that he quit smoking about 21 years ago. His smoking use included cigars. He has a 72.00 pack-year smoking history. He has never used smokeless tobacco.  He reports that he does not drink alcohol and does not use drugs.    Review of Systems       Hypercholesterolemia:    He has been taking simvastatin 20 mg with adequate control of LDL    Lab Results  Component Value Date   CHOL 105 11/27/2019   HDL 37.10 (L) 11/27/2019   LDLCALC 51 11/27/2019   TRIG 88.0 11/27/2019   CHOLHDL 3 11/27/2019    The blood pressure has been normal without any medications, also on SGLT2 drugs   He is followed  regularly by his ophthalmologist for glaucoma regularly  Last foot exam: 05/2019    Physical Examination:  BP 130/80   Pulse 68   Ht '5\' 8"'  (1.727 m)   Wt 177 lb 3.2 oz (80.4 kg)   SpO2 98%   BMI 26.94 kg/m      ASSESSMENT:  Diabetes type 2 non-insulin-dependent  See history of present illness for details  of current diabetes management, blood sugar patterns and problems identified   His A1c is 8.6, previous fructosamine 349  He is on Jardiance low-dose, Metformin and Amaryl His A1c has been as low as 7.2 in the past and likely has high readings after dinner which he does not monitor He has gained weight and can do better with diet and exercise as discussed above Not clear if his blood sugars are consistently high and A1c has been higher than expected for his home readings which he can monitor more often  Recommendations: Increase Jardiance to 20 mg and then start 25 mg prescription If he starts having low normal or lower sugars before dinnertime we will cut down his Amaryl to half tablet Check urinalysis to make sure he does not have asymptomatic UTI Needs to increase fluid  intake overall More readings after dinner or bedtime Cut back on carbohydrate rich and high-fat snacks at night Stop eating high-fat foods in the morning and have more whole-wheat bread instead of croissant and biscuits Given handout on more healthy breakfast options  He will start exercising with either walking or using his indoor bike at least 5 days a week We will also check fructosamine on next visit in 3 months   There are no Patient Instructions on file for this visit.     Elayne Snare   06/07/2020, 8:31 AM

## 2020-06-07 NOTE — Patient Instructions (Addendum)
Check blood sugars on waking up 2-3  days a week  Also check blood sugars about 2 hours after meals and do this after different meals by rotation  Recommended blood sugar levels on waking up are 90-130 and about 2 hours after meal is 130-180  Please bring your blood sugar monitor to each visit, thank you  Use low fat meals under 5g  Less snacks at nite  Walk daily  Jardiance 20mg  daily

## 2020-06-08 ENCOUNTER — Other Ambulatory Visit: Payer: Self-pay | Admitting: Endocrinology

## 2020-06-09 ENCOUNTER — Other Ambulatory Visit: Payer: Self-pay | Admitting: Endocrinology

## 2020-08-02 DIAGNOSIS — L570 Actinic keratosis: Secondary | ICD-10-CM | POA: Diagnosis not present

## 2020-09-01 DIAGNOSIS — H2513 Age-related nuclear cataract, bilateral: Secondary | ICD-10-CM | POA: Diagnosis not present

## 2020-09-01 DIAGNOSIS — H40023 Open angle with borderline findings, high risk, bilateral: Secondary | ICD-10-CM | POA: Diagnosis not present

## 2020-09-01 DIAGNOSIS — H47293 Other optic atrophy, bilateral: Secondary | ICD-10-CM | POA: Diagnosis not present

## 2020-09-01 DIAGNOSIS — E119 Type 2 diabetes mellitus without complications: Secondary | ICD-10-CM | POA: Diagnosis not present

## 2020-09-04 ENCOUNTER — Other Ambulatory Visit: Payer: Self-pay | Admitting: Endocrinology

## 2020-09-06 ENCOUNTER — Encounter: Payer: Medicare Other | Attending: Endocrinology | Admitting: Nutrition

## 2020-09-06 ENCOUNTER — Encounter: Payer: Self-pay | Admitting: Endocrinology

## 2020-09-06 ENCOUNTER — Ambulatory Visit (INDEPENDENT_AMBULATORY_CARE_PROVIDER_SITE_OTHER): Payer: Medicare Other | Admitting: Endocrinology

## 2020-09-06 ENCOUNTER — Other Ambulatory Visit: Payer: Self-pay

## 2020-09-06 VITALS — BP 140/76 | HR 70 | Ht 71.0 in | Wt 173.0 lb

## 2020-09-06 DIAGNOSIS — E7800 Pure hypercholesterolemia, unspecified: Secondary | ICD-10-CM

## 2020-09-06 DIAGNOSIS — E1165 Type 2 diabetes mellitus with hyperglycemia: Secondary | ICD-10-CM

## 2020-09-06 LAB — LIPID PANEL
Cholesterol: 129 mg/dL (ref 0–200)
HDL: 45.9 mg/dL (ref >39.00)
LDL Cholesterol: 64 mg/dL (ref 0–99)
NonHDL: 82.81
Total CHOL/HDL Ratio: 3
Triglycerides: 92 mg/dL (ref 0.0–149.0)
VLDL: 18.4 mg/dL (ref 0.0–40.0)

## 2020-09-06 LAB — COMPREHENSIVE METABOLIC PANEL
ALT: 24 U/L (ref 0–53)
AST: 19 U/L (ref 0–37)
Albumin: 4.5 g/dL (ref 3.5–5.2)
Alkaline Phosphatase: 66 U/L (ref 39–117)
BUN: 21 mg/dL (ref 6–23)
CO2: 28 mEq/L (ref 19–32)
Calcium: 9.7 mg/dL (ref 8.4–10.5)
Chloride: 98 mEq/L (ref 96–112)
Creatinine, Ser: 0.98 mg/dL (ref 0.40–1.50)
GFR: 78.6 mL/min (ref >60.00)
Glucose, Bld: 217 mg/dL — ABNORMAL HIGH (ref 70–99)
Potassium: 4.3 mEq/L (ref 3.5–5.1)
Sodium: 134 mEq/L — ABNORMAL LOW (ref 135–145)
Total Bilirubin: 0.7 mg/dL (ref 0.2–1.2)
Total Protein: 7.7 g/dL (ref 6.0–8.3)

## 2020-09-06 LAB — POCT GLYCOSYLATED HEMOGLOBIN (HGB A1C): Hemoglobin A1C: 9 % — AB (ref 4.0–5.6)

## 2020-09-06 MED ORDER — INSULIN PEN NEEDLE 31G X 5 MM MISC: 1 refills | Status: AC

## 2020-09-06 MED ORDER — INSULIN LISPRO PROT & LISPRO (75-25 MIX) 100 UNIT/ML KWIKPEN: 18.0000 [IU] | PEN_INJECTOR | Freq: Every day | SUBCUTANEOUS | 1 refills | Status: AC

## 2020-09-06 NOTE — Patient Instructions (Signed)
  Start with 8 units at dinner daily and increase by 1 units every 3 days until the waking up sugars are under 130. Then continue the same dose.  If blood sugar is under 90 for 2 days in a row, reduce the dose by 2 units.    Check blood sugars on waking up 7 days a week  Also check blood sugars about 2 hours after meals and do this after different meals by rotation  Recommended blood sugar levels on waking up are 90-130 and about 2 hours after meal is 130-180  Please bring your blood sugar monitor to each visit, thank you

## 2020-09-06 NOTE — Progress Notes (Signed)
Patient ID: [name] Little, male   DOB: 24-Aug-1950, 70 y.o.   MRN: [id number]   Reason for Appointment : Followup for Type 2 Diabetes   History of Present Illness          Diagnosis: Type 2 diabetes mellitus, date of diagnosis: 1994   Recent history:   Oral hypoglycemic drugs: Janumet XR 50/1000, 2 tablets at dinner, Jardiance 25 mg before dinner, Amaryl  1 mg at hs  He tends to have relatively high A1c compared to his home blood sugar average  A1c is the now 9% previously 8.5  Previous fructosamine 349  Current blood sugar patterns, management and problems identified:  He has lost weight likely from reducing his snacking at night  However still has not done much exercise  Blood sugars are overall higher including fasting and after dinner  On his previous visit his fasting reading was 157 and now as high as 188  This is despite increasing Jardiance to 25 mg on the last visit     Glucose monitoring:  less than 1 times daily        Glucometer: One Touch ultra 2  Blood Glucose readings from download    PRE-MEAL Fasting Lunch Dinner Bedtime Overall  Glucose range:  147-188  167     Mean/median:  171    198   POST-MEAL PC Breakfast PC Lunch PC Dinner  Glucose range:  153   163-308  Mean/median:    230   Prior   PRE-MEAL Fasting Lunch Dinner Bedtime Overall  Glucose range:  140, 176  154-188  114, 148    Mean/median:  158  167   154   POST-MEAL PC Breakfast PC Lunch PC Dinner  Glucose range:   188   Mean/median:       Self-care: Avoiding drinks with sugar. Breakfast: oatmeal or egg/toast, sometimes will have croissant meat sandwich       Dietician visit: Most recent: Several years ago        Side effects from medications have been none     Wt Readings from Last 3 Encounters:  09/06/20 173 lb (78.5 kg)  06/07/20 177 lb 3.2 oz (80.4 kg)  03/08/20 172 lb 12.8 oz (78.4 kg)   Lab Results  Component Value Date   HGBA1C 9.0 (A) 09/06/2020    HGBA1C 8.5 (H) 05/31/2020   HGBA1C 8.6 (H) 03/01/2020   Lab Results  Component Value Date   MICROALBUR <0.7 11/27/2019   LDLCALC 51 11/27/2019   CREATININE 1.00 05/31/2020    Lab Results  Component Value Date   FRUCTOSAMINE 349 (H) 11/27/2019   FRUCTOSAMINE 327 (H) 03/02/2019   FRUCTOSAMINE 309 (H) 12/26/2017    Past history: He has been treated for his diabetes with Glucovance for many years  with variable control Also appears to have had minimal diabetes education and has been monitoring his blood sugars only sporadically Review of his A1c shows he has had levels of at least 8% since 2009 and high levels in the last 2 years. On his regimen of glyburide/metformin and low dose Januvia his blood sugars were fluctuating significantly ranging from 80-200+ and also occasional symptoms of hypoglycemia His glyburide was stopped because of significant variability in blood sugars and tendency to hypoglycemia.  Also his Januvia was increased from 25 mg daily therapeutic dose of 100 mg. He was started on low-dose Amaryl on his  visit in 3/15   Office Visit on 09/06/2020  Component Date Value  Ref Range Status  . Hemoglobin A1C 09/06/2020 9.0* 4.0 - 5.6 % Final     Allergies as of 09/06/2020   No Known Allergies     Medication List       Accurate as of September 06, 2020  9:35 AM. If you have any questions, ask your nurse or doctor.        aspirin 81 MG tablet Take 81 mg by mouth at bedtime.   glimepiride 1 MG tablet Commonly known as: AMARYL Take 1 tablet (1 mg total) by mouth daily.   imiquimod 5 % cream Commonly known as: ALDARA APPLY ON THE SKIN AS DIRECTED. APPLY TO SCALP MONDAY,WEDNESDAY,FRIDAY FOR 4WEEKS   Insulin Lispro Prot & Lispro (75-25) 100 UNIT/ML Kwikpen Commonly known as: HumaLOG Mix 75/25 KwikPen Inject 18 Units into the skin daily with supper. Started by: Elayne Snare, MD   Insulin Pen Needle 31G X 5 MM Misc Use on pen qd Started by: Elayne Snare, MD   Janumet  XR 50-1000 MG Tb24 Generic drug: SitaGLIPtin-MetFORMIN HCl TAKE 2 TABLETS BY MOUTH EVERY DAY   Jardiance 25 MG Tabs tablet Generic drug: empagliflozin TAKE 1 TABLET AT DINNERTIME, START WITH 10 MG UNTIL IT RUNS OUT   latanoprost 0.005 % ophthalmic solution Commonly known as: XALATAN   mometasone 50 MCG/ACT nasal spray Commonly known as: Nasonex Place 2 sprays into the nose daily. 2 puffs twice daily   ONE TOUCH ULTRA SYSTEM KIT w/Device Kit Use to test once daily   OneTouch Delica Lancets 00F Misc USE TO TEST ONCE DAILY   OneTouch Ultra test strip Generic drug: glucose blood USE TO CHECK ONCE DAILY- DX CODE- E11.65   simvastatin 20 MG tablet Commonly known as: ZOCOR TAKE 1 TABLET BY MOUTH EVERY DAY       Allergies: No Known Allergies  Past Medical History:  Diagnosis Date  . Actinic keratosis   . Obesity, unspecified   . Other and unspecified hyperlipidemia   . Type II or unspecified type diabetes mellitus without mention of complication, not stated as uncontrolled   . Unspecified essential hypertension     Past Surgical History:  Procedure Laterality Date  . ROTATOR CUFF REPAIR      Family History  Problem Relation Age of Onset  . COPD Father   . Diabetes Mother   . Cancer Brother   . Thyroid cancer Brother   . Colon cancer Neg Hx     Social History:  reports that he quit smoking about 22 years ago. His smoking use included cigars. He has a 72.00 pack-year smoking history. He has never used smokeless tobacco. He reports that he does not drink alcohol and does not use drugs.    Review of Systems       Hypercholesterolemia:    He has been taking simvastatin 20 mg with usually control of LDL    Lab Results  Component Value Date   CHOL 105 11/27/2019   HDL 37.10 (L) 11/27/2019   LDLCALC 51 11/27/2019   TRIG 88.0 11/27/2019   CHOLHDL 3 11/27/2019    The blood pressure has been normal without any medications, also on SGLT2 drugs   He is  followed  regularly by his ophthalmologist for glaucoma regularly  Last foot exam: 05/2019    Physical Examination:  BP 140/76   Pulse 70   Ht '5\' 11"'  (1.803 m)   Wt 173 lb (78.5 kg)   SpO2 97%   BMI 24.13 kg/m  ASSESSMENT:  Diabetes type 2 non-insulin-dependent  See history of present illness for details  of current diabetes management, blood sugar patterns and problems identified   His A1c is 9 compared to 8.6, previous fructosamine 349  He is on Jardiance 25 mg, Metformin and Amaryl His A1c has been as low as 7.2 in the past and has gone up significantly Even though he has lost weight and is not taking 25 mg Jardiance instead of 10 mg blood sugars are occasionally as high as 306  He appears to have insulin deficiency and progression with diabetes, currently BMI only 24  Recommendations:  Start insulin premixed insulin at dinnertime, currently Humalog mix 75/25 is covered  Today discussed in detail the need for evening dose of insulin to cover postprandial rise in the evening after dinner at least along with coverage of overnight hyperglycemia with the premixed insulin  We will use 8 units as the starting dose  Given him a flowsheet to facilitate dosage titration every 3 days x 1 unit until the morning sugars are at least below 130  He will continue his oral medications as is  He will also get detailed instructions on insulin injection by nurse educator  Check chemistry panel and lipids today  When he uses his current test strips will switch him to the One Touch Verio, also if eligible at some point may need CGM  History of hyperlipidemia: Needs follow-up  He will need to do consistent exercise with either walking or using his indoor bike at least 5 days a week Follow-up in 3 weeks   Patient Instructions    Start with 8 units at dinner daily and increase by 1 units every 3 days until the waking up sugars are under 130. Then continue the same dose.  If  blood sugar is under 90 for 2 days in a row, reduce the dose by 2 units.    Check blood sugars on waking up 7 days a week  Also check blood sugars about 2 hours after meals and do this after different meals by rotation  Recommended blood sugar levels on waking up are 90-130 and about 2 hours after meal is 130-180  Please bring your blood sugar monitor to each visit, thank you      Total visit time including counseling =30 minutes   Elayne Snare   09/06/2020, 9:35 AM

## 2020-09-07 ENCOUNTER — Telehealth: Payer: Self-pay | Admitting: Nutrition

## 2020-09-07 NOTE — Telephone Encounter (Signed)
Called to see how patient did with his first injection.  Wife said that pharmacy reported that prescription was for the pen, and they did not have it.  She was given pen needles and was told to return today to pick up the insulin in the pen.  We reviewed how to use the pen, attach the needle and inject the insulin.  She was told to have the pharmacist show them to use this as well, and she agreed to do this.  We reviewed the adjustment schedule again and she re verbalized correctly how and when to do this correctly.  She had no final questions.

## 2020-09-07 NOTE — Patient Instructions (Signed)
Give 8u of insulin 5-10 min. Before supper every evening.   After 3 days, if morning blood sugars are over 130, increase the dose by 1u and wait 3 more days.   Read over booklet given on how to draw up and give insulin.   If questions, please call office.

## 2020-09-07 NOTE — Telephone Encounter (Signed)
Noted, thank you

## 2020-09-07 NOTE — Progress Notes (Signed)
[name] Little is here with his wife to learn how to take insulin with a syringe and vial.  We discussed how this insulin works, how to draw it up. How and where to inject, how to store it, and what to expect.  They reported good understanding of this and had no questions. We also discussed low blood sugars--symptoms and treatment.  He is very much afraid of going to sleep and not waking up due to low blood sugars.  We discussed this in detail, and he reported feeling better about this. Starter kit given with directions on how to draw up the insulin, and inject, as well as a sample of glucose tablets to leave a bedside in case of low blood sugars We reviewed the dosage adjustment schedule, and his wife re verbalized this correctly.   They had no final questions.

## 2020-09-26 ENCOUNTER — Ambulatory Visit: Payer: Medicare Other | Admitting: Endocrinology

## 2020-10-01 ENCOUNTER — Other Ambulatory Visit: Payer: Self-pay | Admitting: Endocrinology

## 2020-10-31 ENCOUNTER — Other Ambulatory Visit: Payer: Self-pay

## 2020-10-31 ENCOUNTER — Encounter: Payer: Self-pay | Admitting: Endocrinology

## 2020-10-31 ENCOUNTER — Ambulatory Visit (INDEPENDENT_AMBULATORY_CARE_PROVIDER_SITE_OTHER): Payer: Medicare Other | Admitting: Endocrinology

## 2020-10-31 VITALS — BP 132/80 | HR 64 | Ht 67.0 in | Wt 164.0 lb

## 2020-10-31 DIAGNOSIS — E1165 Type 2 diabetes mellitus with hyperglycemia: Secondary | ICD-10-CM

## 2020-10-31 NOTE — Progress Notes (Signed)
Patient ID: [name] Little, male   DOB: 12/30/1950, 70 y.o.   MRN: [id number]   Reason for Appointment : Followup for Type 2 Diabetes   History of Present Illness          Diagnosis: Type 2 diabetes mellitus, date of diagnosis: 1994   Recent history:   Oral hypoglycemic drugs: Janumet XR 50/1000, 2 tablets at dinner, Jardiance 25 mg before dinner, Amaryl  0 mg at hs  He tends to have relatively high A1c compared to his home blood sugar average  A1c is most recently 9% previously 8.5  Previous fructosamine 349  Current blood sugar patterns, management and problems identified:  He was told to start premixed insulin at dinnertime because of morning readings averaging 170  However he did not do so and on his own started changing his diet with cutting out sweets like cookies in the evening  Again has lost weight from reducing his snacking  With this his late evening sugars started getting in the low 60s occasionally he stopped glimepiride about 3 weeks ago of  Again still has not done much exercise, only rarely will go walking  Blood sugars are only sporadically higher in the afternoons or after supper    Glucose monitoring:  less than 1 times daily        Glucometer: One Touch ultra 2  Blood Glucose readings from download    PRE-MEAL Fasting Lunch Dinner Bedtime Overall  Glucose range:  115-176    61-210  60-211  Mean/median:  135  126   115 128   Previously:   PRE-MEAL Fasting Lunch Dinner Bedtime Overall  Glucose range:  147-188  167     Mean/median:  171    198   POST-MEAL PC Breakfast PC Lunch PC Dinner  Glucose range:  153   163-308  Mean/median:    230     Self-care: Avoiding drinks with sugar. Breakfast: oatmeal or egg/toast, sometimes will have croissant meat sandwich       Dietician visit: Most recent: Several years ago        Side effects from medications have been none     Wt Readings from Last 3 Encounters:  10/31/20 164 lb (74.4 kg)   09/06/20 173 lb (78.5 kg)  06/07/20 177 lb 3.2 oz (80.4 kg)   Lab Results  Component Value Date   HGBA1C 9.0 (A) 09/06/2020   HGBA1C 8.5 (H) 05/31/2020   HGBA1C 8.6 (H) 03/01/2020   Lab Results  Component Value Date   MICROALBUR <0.7 11/27/2019   LDLCALC 64 09/06/2020   CREATININE 0.98 09/06/2020    Lab Results  Component Value Date   FRUCTOSAMINE 349 (H) 11/27/2019   FRUCTOSAMINE 327 (H) 03/02/2019   FRUCTOSAMINE 309 (H) 12/26/2017    Past history: He has been treated for his diabetes with Glucovance for many years  with variable control Also appears to have had minimal diabetes education and has been monitoring his blood sugars only sporadically Review of his A1c shows he has had levels of at least 8% since 2009 and high levels in the last 2 years. On his regimen of glyburide/metformin and low dose Januvia his blood sugars were fluctuating significantly ranging from 80-200+ and also occasional symptoms of hypoglycemia His glyburide was stopped because of significant variability in blood sugars and tendency to hypoglycemia.  Also his Januvia was increased from 25 mg daily therapeutic dose of 100 mg. He was started on low-dose Amaryl on his  visit in 3/15   No visits with results within 1 Week(s) from this visit.  Latest known visit with results is:  Office Visit on 09/06/2020  Component Date Value Ref Range Status  . Hemoglobin A1C 09/06/2020 9.0* 4.0 - 5.6 % Final  . Cholesterol 09/06/2020 129  0 - 200 mg/dL Final   ATP III Classification       Desirable:  < 200 mg/dL               Borderline High:  200 - 239 mg/dL          High:  > = 240 mg/dL  . Triglycerides 09/06/2020 92.0  0.0 - 149.0 mg/dL Final   Normal:  <150 mg/dLBorderline High:  150 - 199 mg/dL  . HDL 09/06/2020 45.90  >39.00 mg/dL Final  . VLDL 09/06/2020 18.4  0.0 - 40.0 mg/dL Final  . LDL Cholesterol 09/06/2020 64  0 - 99 mg/dL Final  . Total CHOL/HDL Ratio 09/06/2020 3   Final                  Men           Women1/2 Average Risk     3.4          3.3Average Risk          5.0          4.42X Average Risk          9.6          7.13X Average Risk          15.0          11.0                      . NonHDL 09/06/2020 82.81   Final   NOTE:  Non-HDL goal should be 30 mg/dL higher than patient's LDL goal (i.e. LDL goal of < 70 mg/dL, would have non-HDL goal of < 100 mg/dL)  . Sodium 09/06/2020 134* 135 - 145 mEq/L Final  . Potassium 09/06/2020 4.3  3.5 - 5.1 mEq/L Final  . Chloride 09/06/2020 98  96 - 112 mEq/L Final  . CO2 09/06/2020 28  19 - 32 mEq/L Final  . Glucose, Bld 09/06/2020 217* 70 - 99 mg/dL Final  . BUN 09/06/2020 21  6 - 23 mg/dL Final  . Creatinine, Ser 09/06/2020 0.98  0.40 - 1.50 mg/dL Final  . Total Bilirubin 09/06/2020 0.7  0.2 - 1.2 mg/dL Final  . Alkaline Phosphatase 09/06/2020 66  39 - 117 U/L Final  . AST 09/06/2020 19  0 - 37 U/L Final  . ALT 09/06/2020 24  0 - 53 U/L Final  . Total Protein 09/06/2020 7.7  6.0 - 8.3 g/dL Final  . Albumin 09/06/2020 4.5  3.5 - 5.2 g/dL Final  . GFR 09/06/2020 78.60  >60.00 mL/min Final   Calculated using the CKD-EPI Creatinine Equation (2021)  . Calcium 09/06/2020 9.7  8.4 - 10.5 mg/dL Final     Allergies as of 10/31/2020   No Known Allergies     Medication List       Accurate as of October 31, 2020 11:47 AM. If you have any questions, ask your nurse or doctor.        aspirin 81 MG tablet Take 81 mg by mouth at bedtime.   glimepiride 1 MG tablet Commonly known as: AMARYL TAKE 1 TABLET BY MOUTH EVERY DAY   imiquimod 5 %  cream Commonly known as: ALDARA APPLY ON THE SKIN AS DIRECTED. APPLY TO SCALP MONDAY,WEDNESDAY,FRIDAY FOR 4WEEKS   Insulin Lispro Prot & Lispro (75-25) 100 UNIT/ML Kwikpen Commonly known as: HumaLOG Mix 75/25 KwikPen Inject 18 Units into the skin daily with supper.   Insulin Pen Needle 31G X 5 MM Misc Use on pen qd   Janumet XR 50-1000 MG Tb24 Generic drug: SitaGLIPtin-MetFORMIN HCl TAKE 2 TABLETS BY MOUTH  EVERY DAY   Jardiance 25 MG Tabs tablet Generic drug: empagliflozin TAKE 1 TABLET AT DINNERTIME, START WITH 10 MG UNTIL IT RUNS OUT   latanoprost 0.005 % ophthalmic solution Commonly known as: XALATAN   mometasone 50 MCG/ACT nasal spray Commonly known as: Nasonex Place 2 sprays into the nose daily. 2 puffs twice daily   ONE TOUCH ULTRA SYSTEM KIT w/Device Kit Use to test once daily   OneTouch Delica Lancets 62B Misc USE TO TEST ONCE DAILY   OneTouch Ultra test strip Generic drug: glucose blood USE TO CHECK ONCE DAILY- DX CODE- E11.65   simvastatin 20 MG tablet Commonly known as: ZOCOR TAKE 1 TABLET BY MOUTH EVERY DAY       Allergies: No Known Allergies  Past Medical History:  Diagnosis Date  . Actinic keratosis   . Obesity, unspecified   . Other and unspecified hyperlipidemia   . Type II or unspecified type diabetes mellitus without mention of complication, not stated as uncontrolled   . Unspecified essential hypertension     Past Surgical History:  Procedure Laterality Date  . ROTATOR CUFF REPAIR      Family History  Problem Relation Age of Onset  . COPD Father   . Diabetes Mother   . Cancer Brother   . Thyroid cancer Brother   . Colon cancer Neg Hx     Social History:  reports that he quit smoking about 22 years ago. His smoking use included cigars. He has a 72.00 pack-year smoking history. He has never used smokeless tobacco. He reports that he does not drink alcohol and does not use drugs.    Review of Systems       Hypercholesterolemia:    He has been taking simvastatin 20 mg with usually control of LDL    Lab Results  Component Value Date   CHOL 129 09/06/2020   HDL 45.90 09/06/2020   LDLCALC 64 09/06/2020   TRIG 92.0 09/06/2020   CHOLHDL 3 09/06/2020    The blood pressure has been normal without any medications, also on SGLT2 drugs   He is followed  regularly by his ophthalmologist for glaucoma regularly  Last foot exam:  05/2019    Physical Examination:  BP 132/80   Pulse 64   Ht '5\' 7"'  (1.702 m)   Wt 164 lb (74.4 kg)   SpO2 98%   BMI 25.69 kg/m      ASSESSMENT:  Diabetes type 2 non-insulin-dependent  See history of present illness for details  of current diabetes management, blood sugar patterns and problems identified   His A1c is last 9 compared to 8.6 Previous fructosamine 349  He is on Jardiance 25 mg and metformin now  With his changing his diet significantly he has brought his blood sugars down  As before he usually does better when he is approached about starting insulin However fasting readings now trending higher than high as high as about 170  Recommendations:  Start only 0.5 mg Amaryl at bedtime  Consistent diet  Regular walking for exercise  :  Having unusually high or low blood sugars  Repeat A1c on next visit   Patient Instructions  Take 1/2 Glimeperide aat BEDTIME  Walk daily  Check blood sugars on waking up 3-4  days a week  Also check blood sugars about 2 hours after meals and do this after different meals by rotation  Recommended blood sugar levels on waking up are 90-130 and about 2 hours after meal is 130-160  Please bring your blood sugar monitor to each visit, thank you        Elayne Snare   10/31/2020, 11:47 AM

## 2020-10-31 NOTE — Patient Instructions (Addendum)
Take 1/2 Glimeperide aat BEDTIME  Walk daily  Check blood sugars on waking up 3-4  days a week  Also check blood sugars about 2 hours after meals and do this after different meals by rotation  Recommended blood sugar levels on waking up are 90-130 and about 2 hours after meal is 130-160  Please bring your blood sugar monitor to each visit, thank you

## 2020-11-10 DIAGNOSIS — I10 Essential (primary) hypertension: Secondary | ICD-10-CM | POA: Diagnosis not present

## 2020-11-10 DIAGNOSIS — E1169 Type 2 diabetes mellitus with other specified complication: Secondary | ICD-10-CM | POA: Diagnosis not present

## 2020-11-10 DIAGNOSIS — E7800 Pure hypercholesterolemia, unspecified: Secondary | ICD-10-CM | POA: Diagnosis not present

## 2020-11-10 DIAGNOSIS — Z0000 Encounter for general adult medical examination without abnormal findings: Secondary | ICD-10-CM | POA: Diagnosis not present

## 2020-11-10 DIAGNOSIS — H409 Unspecified glaucoma: Secondary | ICD-10-CM | POA: Diagnosis not present

## 2020-12-01 ENCOUNTER — Other Ambulatory Visit: Payer: Self-pay | Admitting: Endocrinology

## 2020-12-07 ENCOUNTER — Other Ambulatory Visit: Payer: Self-pay | Admitting: Endocrinology

## 2021-01-10 ENCOUNTER — Ambulatory Visit: Payer: Medicare Other | Admitting: Endocrinology

## 2021-01-12 ENCOUNTER — Other Ambulatory Visit: Payer: Self-pay

## 2021-01-12 ENCOUNTER — Ambulatory Visit (INDEPENDENT_AMBULATORY_CARE_PROVIDER_SITE_OTHER): Payer: Medicare Other | Admitting: Endocrinology

## 2021-01-12 ENCOUNTER — Encounter: Payer: Self-pay | Admitting: Endocrinology

## 2021-01-12 VITALS — BP 126/70 | HR 61 | Ht 67.0 in | Wt 166.0 lb

## 2021-01-12 DIAGNOSIS — E1165 Type 2 diabetes mellitus with hyperglycemia: Secondary | ICD-10-CM

## 2021-01-12 LAB — COMPREHENSIVE METABOLIC PANEL
ALT: 16 U/L (ref 0–53)
AST: 22 U/L (ref 0–37)
Albumin: 4.4 g/dL (ref 3.5–5.2)
Alkaline Phosphatase: 52 U/L (ref 39–117)
BUN: 17 mg/dL (ref 6–23)
CO2: 28 mEq/L (ref 19–32)
Calcium: 9.4 mg/dL (ref 8.4–10.5)
Chloride: 102 mEq/L (ref 96–112)
Creatinine, Ser: 0.94 mg/dL (ref 0.40–1.50)
GFR: 82.42 mL/min (ref >60.00)
Glucose, Bld: 156 mg/dL — ABNORMAL HIGH (ref 70–99)
Potassium: 4.3 mEq/L (ref 3.5–5.1)
Sodium: 138 mEq/L (ref 135–145)
Total Bilirubin: 0.6 mg/dL (ref 0.2–1.2)
Total Protein: 7.2 g/dL (ref 6.0–8.3)

## 2021-01-12 LAB — POCT GLYCOSYLATED HEMOGLOBIN (HGB A1C): Hemoglobin A1C: 7.1 % — AB (ref 4.0–5.6)

## 2021-01-12 LAB — MICROALBUMIN / CREATININE URINE RATIO
Creatinine,U: 42.1 mg/dL
Microalb Creat Ratio: 1.7 mg/g (ref 0.0–30.0)
Microalb, Ur: <0.7 mg/dL (ref 0.0–1.9)

## 2021-01-12 MED ORDER — EMPAGLIFLOZIN 25 MG PO TABS: 25.0000 mg | ORAL_TABLET | Freq: Every day | ORAL | 5 refills | Status: DC

## 2021-01-12 NOTE — Progress Notes (Signed)
Patient ID: [name] Little, male   DOB: 01/31/1951, 70 y.o.   MRN: [id number]   Reason for Appointment : Followup for Type 2 Diabetes   History of Present Illness          Diagnosis: Type 2 diabetes mellitus, date of diagnosis: 1994   Recent history:   Oral hypoglycemic drugs: Janumet XR 50/1000, 2 tablets at dinner, Jardiance 25 mg before dinner, Amaryl  0.5 mg at hs  He tends to have relatively high A1c compared to his home blood sugar average  A1c is excellent now at 7.1 compared to 9% This is his best level since 2017  Previous fructosamine 349  Current blood sugar patterns, management and problems identified: He likely has been able to get his blood sugars controlled with significantly improving his diet He has cut back on sweets and portions as well as snacks  previously had been recommended insulin which she did not want to start Also likely is more active with walking more activities with his dog as well as family With adding Amaryl on the last visit at bedtime his morning sugars are more consistently controlled as well as only rare hypoglycemia  Has had only 1 unusually high reading of 187 at bedtime He feels that his low sugar episodes are very rare and only once had a glucose of 64 with eating earlier in the evening Usually not having bedtime snack    Glucose monitoring:  less than 1 times daily        Glucometer: One Touch ultra 2  Blood Glucose readings from download    PRE-MEAL Fasting Lunch Dinner Bedtime Overall  Glucose range: 81-148   90-187 64-187  Mean/median: 121   117 118   POST-MEAL PC Breakfast PC Lunch PC Dinner  Glucose range:   ?  Mean/median:      Previously  PRE-MEAL Fasting Lunch Dinner Bedtime Overall  Glucose range:  115-176    61-210  60-211  Mean/median:  135  126   115 128    Self-care: Avoiding drinks with sugar. Breakfast: oatmeal or egg/toast, sometimes will have croissant meat sandwich       Dietician visit: Most  recent: Several years ago        Side effects from medications have been none     Wt Readings from Last 3 Encounters:  01/12/21 166 lb (75.3 kg)  10/31/20 164 lb (74.4 kg)  09/06/20 173 lb (78.5 kg)   Lab Results  Component Value Date   HGBA1C 7.1 (A) 01/12/2021   HGBA1C 9.0 (A) 09/06/2020   HGBA1C 8.5 (H) 05/31/2020   Lab Results  Component Value Date   MICROALBUR <0.7 11/27/2019   LDLCALC 64 09/06/2020   CREATININE 0.98 09/06/2020    Lab Results  Component Value Date   FRUCTOSAMINE 349 (H) 11/27/2019   FRUCTOSAMINE 327 (H) 03/02/2019   FRUCTOSAMINE 309 (H) 12/26/2017    Past history: He has been treated for his diabetes with Glucovance for many years  with variable control Also appears to have had minimal diabetes education and has been monitoring his blood sugars only sporadically Review of his A1c shows he has had levels of at least 8% since 2009 and high levels in the last 2 years. On his regimen of glyburide/metformin and low dose Januvia his blood sugars were fluctuating significantly ranging from 80-200+ and also occasional symptoms of hypoglycemia His glyburide was stopped because of significant variability in blood sugars and tendency to hypoglycemia.  Also  his Januvia was increased from 25 mg daily therapeutic dose of 100 mg. He was started on low-dose Amaryl on his  visit in 3/15   Office Visit on 01/12/2021  Component Date Value Ref Range Status   Hemoglobin A1C 01/12/2021 7.1 (A) 4.0 - 5.6 % Final     Allergies as of 01/12/2021   No Known Allergies      Medication List        Accurate as of January 12, 2021  8:55 AM. If you have any questions, ask your nurse or doctor.          aspirin 81 MG tablet Take 81 mg by mouth at bedtime.   glimepiride 1 MG tablet Commonly known as: AMARYL TAKE 1 TABLET BY MOUTH EVERY DAY   imiquimod 5 % cream Commonly known as: ALDARA APPLY ON THE SKIN AS DIRECTED. APPLY TO SCALP MONDAY,WEDNESDAY,FRIDAY FOR  4WEEKS   Insulin Lispro Prot & Lispro (75-25) 100 UNIT/ML Kwikpen Commonly known as: HumaLOG Mix 75/25 KwikPen Inject 18 Units into the skin daily with supper.   Insulin Pen Needle 31G X 5 MM Misc Use on pen qd   Janumet XR 50-1000 MG Tb24 Generic drug: SitaGLIPtin-MetFORMIN HCl TAKE 2 TABLETS BY MOUTH EVERY DAY   Jardiance 25 MG Tabs tablet Generic drug: empagliflozin TAKE 1 TABLET AT DINNERTIME, START WITH 10 MG UNTIL IT RUNS OUT   latanoprost 0.005 % ophthalmic solution Commonly known as: XALATAN   mometasone 50 MCG/ACT nasal spray Commonly known as: Nasonex Place 2 sprays into the nose daily. 2 puffs twice daily   ONE TOUCH ULTRA SYSTEM KIT w/Device Kit Use to test once daily   OneTouch Delica Lancets 26J Misc USE TO TEST ONCE DAILY   OneTouch Ultra test strip Generic drug: glucose blood USE TO CHECK ONCE DAILY- DX CODE- E11.65   simvastatin 20 MG tablet Commonly known as: ZOCOR TAKE 1 TABLET BY MOUTH EVERY DAY        Allergies: No Known Allergies  Past Medical History:  Diagnosis Date   Actinic keratosis    Obesity, unspecified    Other and unspecified hyperlipidemia    Type II or unspecified type diabetes mellitus without mention of complication, not stated as uncontrolled    Unspecified essential hypertension     Past Surgical History:  Procedure Laterality Date   ROTATOR CUFF REPAIR      Family History  Problem Relation Age of Onset   COPD Father    Diabetes Mother    Cancer Brother    Thyroid cancer Brother    Colon cancer Neg Hx     Social History:  reports that he quit smoking about 22 years ago. His smoking use included cigars and cigarettes. He has a 72.00 pack-year smoking history. He has never used smokeless tobacco. He reports that he does not drink alcohol and does not use drugs.    Review of Systems       Hypercholesterolemia:    He has been taking simvastatin 20 mg with usually control of LDL    Lab Results  Component  Value Date   CHOL 129 09/06/2020   HDL 45.90 09/06/2020   LDLCALC 64 09/06/2020   TRIG 92.0 09/06/2020   CHOLHDL 3 09/06/2020    The blood pressure has been normal without any medications, also on SGLT2 drugs   He is followed  regularly by his ophthalmologist for glaucoma regularly  Last foot exam: 5/22    Physical Examination:  BP 126/70  Pulse 61   Ht '5\' 7"'  (1.702 m)   Wt 166 lb (75.3 kg)   SpO2 99%   BMI 26.00 kg/m      ASSESSMENT:  Diabetes type 2 non-insulin-dependent  See history of present illness for details  of current diabetes management, blood sugar patterns and problems identified   His A1c is much better at 7.1 compared to 9  He is on Jardiance 25 mg and metformin as well as low-dose Amaryl at bedtime now  With his consistently improving his diet significantly he has minimal to get better control Blood sugars are more stable also Only once has had a glucose of 64 possibly from eating earlier in the evening However not checking the blood sugars after meals although bedtime readings are averaging less than 129  He has done better with being physically more active also as well as his wife says he is watching his diet We will continue same medications at this time Discussed what kind of bedtime snacks to have repeat is eating out in the evening Continue to increase physical activity with walking or other aerobic exercises  Will need to recheck his microalbumin and renal function today   There are no Patient Instructions on file for this visit.     Elayne Snare   01/12/2021, 8:55 AM

## 2021-01-12 NOTE — Patient Instructions (Signed)
Bedtime snack if eating early

## 2021-03-07 DIAGNOSIS — H2513 Age-related nuclear cataract, bilateral: Secondary | ICD-10-CM | POA: Diagnosis not present

## 2021-03-07 DIAGNOSIS — H40023 Open angle with borderline findings, high risk, bilateral: Secondary | ICD-10-CM | POA: Diagnosis not present

## 2021-03-07 DIAGNOSIS — E119 Type 2 diabetes mellitus without complications: Secondary | ICD-10-CM | POA: Diagnosis not present

## 2021-03-07 DIAGNOSIS — H47293 Other optic atrophy, bilateral: Secondary | ICD-10-CM | POA: Diagnosis not present

## 2021-03-21 ENCOUNTER — Other Ambulatory Visit: Payer: Self-pay | Admitting: Endocrinology

## 2021-04-04 DIAGNOSIS — D225 Melanocytic nevi of trunk: Secondary | ICD-10-CM | POA: Diagnosis not present

## 2021-04-04 DIAGNOSIS — L578 Other skin changes due to chronic exposure to nonionizing radiation: Secondary | ICD-10-CM | POA: Diagnosis not present

## 2021-04-04 DIAGNOSIS — L814 Other melanin hyperpigmentation: Secondary | ICD-10-CM | POA: Diagnosis not present

## 2021-04-04 DIAGNOSIS — L570 Actinic keratosis: Secondary | ICD-10-CM | POA: Diagnosis not present

## 2021-04-07 ENCOUNTER — Other Ambulatory Visit: Payer: Self-pay | Admitting: Endocrinology

## 2021-04-11 ENCOUNTER — Other Ambulatory Visit: Payer: Self-pay | Admitting: Endocrinology

## 2021-04-11 ENCOUNTER — Other Ambulatory Visit: Payer: Self-pay

## 2021-04-11 ENCOUNTER — Other Ambulatory Visit (INDEPENDENT_AMBULATORY_CARE_PROVIDER_SITE_OTHER): Payer: Medicare Other

## 2021-04-11 DIAGNOSIS — E1165 Type 2 diabetes mellitus with hyperglycemia: Secondary | ICD-10-CM

## 2021-04-11 DIAGNOSIS — E7800 Pure hypercholesterolemia, unspecified: Secondary | ICD-10-CM | POA: Diagnosis not present

## 2021-04-11 LAB — LIPID PANEL
Cholesterol: 133 mg/dL (ref 0–200)
HDL: 49.1 mg/dL (ref >39.00)
LDL Cholesterol: 65 mg/dL (ref 0–99)
NonHDL: 83.79
Total CHOL/HDL Ratio: 3
Triglycerides: 92 mg/dL (ref 0.0–149.0)
VLDL: 18.4 mg/dL (ref 0.0–40.0)

## 2021-04-11 LAB — HEMOGLOBIN A1C: Hgb A1c MFr Bld: 8.5 % — ABNORMAL HIGH (ref 4.6–6.5)

## 2021-04-11 LAB — BASIC METABOLIC PANEL
BUN: 30 mg/dL — ABNORMAL HIGH (ref 6–23)
CO2: 27 mEq/L (ref 19–32)
Calcium: 9.6 mg/dL (ref 8.4–10.5)
Chloride: 100 mEq/L (ref 96–112)
Creatinine, Ser: 1.07 mg/dL (ref 0.40–1.50)
GFR: 70.44 mL/min (ref >60.00)
Glucose, Bld: 197 mg/dL — ABNORMAL HIGH (ref 70–99)
Potassium: 4.3 mEq/L (ref 3.5–5.1)
Sodium: 137 mEq/L (ref 135–145)

## 2021-04-18 ENCOUNTER — Ambulatory Visit (INDEPENDENT_AMBULATORY_CARE_PROVIDER_SITE_OTHER): Payer: Medicare Other | Admitting: Endocrinology

## 2021-04-18 ENCOUNTER — Encounter: Payer: Self-pay | Admitting: Endocrinology

## 2021-04-18 ENCOUNTER — Other Ambulatory Visit: Payer: Self-pay

## 2021-04-18 VITALS — BP 128/78 | HR 69 | Ht 67.0 in | Wt 172.5 lb

## 2021-04-18 DIAGNOSIS — E1165 Type 2 diabetes mellitus with hyperglycemia: Secondary | ICD-10-CM | POA: Diagnosis not present

## 2021-04-18 DIAGNOSIS — Z125 Encounter for screening for malignant neoplasm of prostate: Secondary | ICD-10-CM | POA: Diagnosis not present

## 2021-04-18 DIAGNOSIS — Z23 Encounter for immunization: Secondary | ICD-10-CM | POA: Diagnosis not present

## 2021-04-18 DIAGNOSIS — E7800 Pure hypercholesterolemia, unspecified: Secondary | ICD-10-CM | POA: Diagnosis not present

## 2021-04-18 NOTE — Patient Instructions (Addendum)
Check blood sugars on waking up 4  days a week  Also check blood sugars about 2 hours after meals and do this after different meals by rotation  Recommended blood sugar levels on waking up are 90-130 and about 2 hours after meal is 130-160  Please bring your blood sugar monitor to each visit, thank you  

## 2021-04-18 NOTE — Progress Notes (Signed)
Patient ID: [name] Little, male   DOB: May 19, 1951, 70 y.o.   MRN: [id number]   Reason for Appointment : Followup for Type 2 Diabetes   History of Present Illness          Diagnosis: Type 2 diabetes mellitus, date of diagnosis: 1994   Recent history:   Oral hypoglycemic drugs: Janumet XR 50/1000, 2 tablets at dinner, Jardiance 25 mg before dinner, Amaryl  0.5 mg at hs  He tends to have relatively high A1c compared to his home blood sugar average  A1c is much higher at 8.5 compared to 7.1   Previous fructosamine 349  Current blood sugar patterns, management and problems identified: He has been working longer hours and has not had the time to exercise as before  He says he was very active with walking and running especially with the dog previously but now he is not doing much because of long working hours  He has gained back some weight also This is despite not eating excessive amount of sweets or desserts except occasional ice cream  However he appears to have mostly higher FASTING blood sugars, highest 197 in the lab  Sugars are reasonably good the rest of the day and also no hypoglycemia with low sugar 76    Glucose monitoring:  less than 1 times daily        Glucometer: One Touch ultra 2  Blood Glucose readings from download    PRE-MEAL Fasting Lunch Dinner Bedtime Overall  Glucose range: 151-184    76-184  Mean/median: 164       POST-MEAL PC Breakfast PC Lunch PC Dinner  Glucose range:   87-150  Mean/median:      Previously:  PRE-MEAL Fasting Lunch Dinner Bedtime Overall  Glucose range: 81-148   90-187 64-187  Mean/median: 121   117 118   POST-MEAL PC Breakfast PC Lunch PC Dinner  Glucose range:   ?  Mean/median:        Self-care: Avoiding drinks with sugar. Breakfast: oatmeal or egg/toast, sometimes will have croissant meat sandwich       Dietician visit: Most recent: Several years ago        Side effects from medications have been none      Wt Readings from Last 3 Encounters:  04/18/21 172 lb 8 oz (78.2 kg)  01/12/21 166 lb (75.3 kg)  10/31/20 164 lb (74.4 kg)   Lab Results  Component Value Date   HGBA1C 8.5 (H) 04/11/2021   HGBA1C 7.1 (A) 01/12/2021   HGBA1C 9.0 (A) 09/06/2020   Lab Results  Component Value Date   MICROALBUR <0.7 01/12/2021   [place] 65 04/11/2021   CREATININE 1.07 04/11/2021    Lab Results  Component Value Date   FRUCTOSAMINE 349 (H) 11/27/2019   FRUCTOSAMINE 327 (H) 03/02/2019   FRUCTOSAMINE 309 (H) 12/26/2017    Past history: He has been treated for his diabetes with Glucovance for many years  with variable control Also appears to have had minimal diabetes education and has been monitoring his blood sugars only sporadically Review of his A1c shows he has had levels of at least 8% since 2009 and high levels in the last 2 years. On his regimen of glyburide/metformin and low dose Januvia his blood sugars were fluctuating significantly ranging from 80-200+ and also occasional symptoms of hypoglycemia His glyburide was stopped because of significant variability in blood sugars and tendency to hypoglycemia.  Also his Januvia was increased from 25 mg daily  therapeutic dose of 100 mg. He was started on low-dose Amaryl on his  visit in 3/15   No visits with results within 1 Week(s) from this visit.  Latest known visit with results is:  Lab on 04/11/2021  Component Date Value Ref Range Status   Cholesterol 04/11/2021 133  0 - 200 mg/dL Final   ATP III Classification       Desirable:  < 200 mg/dL               Borderline High:  200 - 239 mg/dL          High:  > = 240 mg/dL   Triglycerides 04/11/2021 92.0  0.0 - 149.0 mg/dL Final   Normal:  <150 mg/dLBorderline High:  150 - 199 mg/dL   HDL 04/11/2021 49.10  >39.00 mg/dL Final   VLDL 04/11/2021 18.4  0.0 - 40.0 mg/dL Final   LDL Cholesterol 04/11/2021 65  0 - 99 mg/dL Final   Total CHOL/HDL Ratio 04/11/2021 3   Final                  Men           Women1/2 Average Risk     3.4          3.3Average Risk          5.0          4.42X Average Risk          9.6          7.13X Average Risk          15.0          11.0                       NonHDL 04/11/2021 83.79   Final   NOTE:  Non-HDL goal should be 30 mg/dL higher than patient's LDL goal (i.e. LDL goal of < 70 mg/dL, would have non-HDL goal of < 100 mg/dL)   Sodium 04/11/2021 137  135 - 145 mEq/L Final   Potassium 04/11/2021 4.3  3.5 - 5.1 mEq/L Final   Chloride 04/11/2021 100  96 - 112 mEq/L Final   CO2 04/11/2021 27  19 - 32 mEq/L Final   Glucose, Bld 04/11/2021 197 (A) 70 - 99 mg/dL Final   BUN 04/11/2021 30 (A) 6 - 23 mg/dL Final   Creatinine, Ser 04/11/2021 1.07  0.40 - 1.50 mg/dL Final   GFR 04/11/2021 70.44  >60.00 mL/min Final   Calculated using the CKD-EPI Creatinine Equation (2021)   Calcium 04/11/2021 9.6  8.4 - 10.5 mg/dL Final   Hgb A1c MFr Bld 04/11/2021 8.5 (A) 4.6 - 6.5 % Final   Glycemic Control Guidelines for People with Diabetes:Non Diabetic:  <6%Goal of Therapy: <7%Additional Action Suggested:  >8%      Allergies as of 04/18/2021   No Known Allergies      Medication List        Accurate as of April 18, 2021  8:36 AM. If you have any questions, ask your nurse or doctor.          aspirin 81 MG tablet Take 81 mg by mouth at bedtime.   empagliflozin 25 MG Tabs tablet Commonly known as: Jardiance Take 1 tablet (25 mg total) by mouth daily.   glimepiride 1 MG tablet Commonly known as: AMARYL TAKE 1 TABLET BY MOUTH EVERY DAY   imiquimod 5 % cream Commonly known as: ALDARA APPLY  ON THE SKIN AS DIRECTED. APPLY TO SCALP MONDAY,WEDNESDAY,FRIDAY FOR 4WEEKS   Insulin Lispro Prot & Lispro (75-25) 100 UNIT/ML Kwikpen Commonly known as: HumaLOG Mix 75/25 KwikPen Inject 18 Units into the skin daily with supper.   Insulin Pen Needle 31G X 5 MM Misc Use on pen qd   Janumet XR 50-1000 MG Tb24 Generic drug: SitaGLIPtin-MetFORMIN HCl TAKE 2 TABLETS BY  MOUTH EVERY DAY   latanoprost 0.005 % ophthalmic solution Commonly known as: XALATAN   mometasone 50 MCG/ACT nasal spray Commonly known as: Nasonex Place 2 sprays into the nose daily. 2 puffs twice daily   ONE TOUCH ULTRA SYSTEM KIT w/Device Kit Use to test once daily   OneTouch Delica Lancets 82X Misc USE TO TEST ONCE DAILY   OneTouch Ultra test strip Generic drug: glucose blood USE TO CHECK ONCE DAILY- DX CODE- E11.65   simvastatin 20 MG tablet Commonly known as: ZOCOR TAKE 1 TABLET BY MOUTH EVERY DAY        Allergies: No Known Allergies  Past Medical History:  Diagnosis Date   Actinic keratosis    Obesity, unspecified    Other and unspecified hyperlipidemia    Type II or unspecified type diabetes mellitus without mention of complication, not stated as uncontrolled    Unspecified essential hypertension     Past Surgical History:  Procedure Laterality Date   ROTATOR CUFF REPAIR      Family History  Problem Relation Age of Onset   COPD Father    Diabetes Mother    Cancer Brother    Thyroid cancer Brother    Colon cancer Neg Hx     Social History:  reports that he quit smoking about 22 years ago. His smoking use included cigars and cigarettes. He has a 72.00 pack-year smoking history. He has never used smokeless tobacco. He reports that he does not drink alcohol and does not use drugs.    Review of Systems       Hypercholesterolemia:    He has been taking simvastatin 20 mg with control of LDL    Lab Results  Component Value Date   CHOL 133 04/11/2021   HDL 49.10 04/11/2021   [place] 65 04/11/2021   TRIG 92.0 04/11/2021   CHOLHDL 3 04/11/2021    The blood pressure has been normal without any medications, also on SGLT2 drugs   He is followed  regularly by his ophthalmologist for glaucoma regularly  Last foot exam: 5/22    Physical Examination:  BP 128/78   Pulse 69   Ht '5\' 7"'  (1.702 m)   Wt 172 lb 8 oz (78.2 kg)   SpO2 96%   BMI 27.02  kg/m      ASSESSMENT:  Diabetes type 2 non-insulin-dependent  See history of present illness for details  of current diabetes management, blood sugar patterns and problems identified   His A1c which was previously much better at 7.1 is back up to 8.5  He is on Jardiance 25 mg and 2 g metformin as well as low-dose Amaryl at bedtime as before  Previously with consistent diet and increased exercise he had achieved good blood sugar control especially overnight  Although he has checked more readings in the evenings lately has mostly high fasting readings   Discussed that likely needs basal insulin for control but he is very reluctant to do this  He thinks he can get back on calories, get low sugar desserts like ice cream and significantly increase his exercise level  Will defer insulin until his follow-up in 3 months   There are no Patient Instructions on file for this visit.   Influenza vaccine   Elayne Snare   04/18/2021, 8:36 AM

## 2021-06-04 DIAGNOSIS — Z20828 Contact with and (suspected) exposure to other viral communicable diseases: Secondary | ICD-10-CM | POA: Diagnosis not present

## 2021-06-04 DIAGNOSIS — J069 Acute upper respiratory infection, unspecified: Secondary | ICD-10-CM | POA: Diagnosis not present

## 2021-06-04 DIAGNOSIS — R059 Cough, unspecified: Secondary | ICD-10-CM | POA: Diagnosis not present

## 2021-06-23 ENCOUNTER — Other Ambulatory Visit: Payer: Self-pay | Admitting: Endocrinology

## 2021-07-03 DIAGNOSIS — R051 Acute cough: Secondary | ICD-10-CM | POA: Diagnosis not present

## 2021-07-03 DIAGNOSIS — Z20828 Contact with and (suspected) exposure to other viral communicable diseases: Secondary | ICD-10-CM | POA: Diagnosis not present

## 2021-07-03 DIAGNOSIS — J069 Acute upper respiratory infection, unspecified: Secondary | ICD-10-CM | POA: Diagnosis not present

## 2021-07-13 ENCOUNTER — Other Ambulatory Visit (INDEPENDENT_AMBULATORY_CARE_PROVIDER_SITE_OTHER): Payer: Medicare Other

## 2021-07-13 ENCOUNTER — Other Ambulatory Visit: Payer: Self-pay

## 2021-07-13 DIAGNOSIS — Z125 Encounter for screening for malignant neoplasm of prostate: Secondary | ICD-10-CM

## 2021-07-13 DIAGNOSIS — E1165 Type 2 diabetes mellitus with hyperglycemia: Secondary | ICD-10-CM | POA: Diagnosis not present

## 2021-07-13 DIAGNOSIS — E7800 Pure hypercholesterolemia, unspecified: Secondary | ICD-10-CM

## 2021-07-13 LAB — COMPREHENSIVE METABOLIC PANEL
ALT: 23 U/L (ref 0–53)
AST: 18 U/L (ref 0–37)
Albumin: 4.3 g/dL (ref 3.5–5.2)
Alkaline Phosphatase: 56 U/L (ref 39–117)
BUN: 22 mg/dL (ref 6–23)
CO2: 29 mEq/L (ref 19–32)
Calcium: 9.2 mg/dL (ref 8.4–10.5)
Chloride: 100 mEq/L (ref 96–112)
Creatinine, Ser: 0.89 mg/dL (ref 0.40–1.50)
GFR: 86.83 mL/min (ref >60.00)
Glucose, Bld: 151 mg/dL — ABNORMAL HIGH (ref 70–99)
Potassium: 4.1 mEq/L (ref 3.5–5.1)
Sodium: 137 mEq/L (ref 135–145)
Total Bilirubin: 0.6 mg/dL (ref 0.2–1.2)
Total Protein: 7 g/dL (ref 6.0–8.3)

## 2021-07-13 LAB — HEMOGLOBIN A1C: Hgb A1c MFr Bld: 8.6 % — ABNORMAL HIGH (ref 4.6–6.5)

## 2021-07-13 LAB — TSH: TSH: 2.84 u[IU]/mL (ref 0.35–5.50)

## 2021-07-13 LAB — T4, FREE: Free T4: 0.98 ng/dL (ref 0.60–1.60)

## 2021-07-13 LAB — PSA, MEDICARE: PSA: 1.34 ng/ml (ref 0.10–4.00)

## 2021-07-14 LAB — FRUCTOSAMINE: Fructosamine: 347 umol/L — ABNORMAL HIGH (ref 0–285)

## 2021-07-18 ENCOUNTER — Encounter: Payer: Self-pay | Admitting: Endocrinology

## 2021-07-18 ENCOUNTER — Other Ambulatory Visit: Payer: Self-pay

## 2021-07-18 ENCOUNTER — Ambulatory Visit (INDEPENDENT_AMBULATORY_CARE_PROVIDER_SITE_OTHER): Payer: Medicare Other | Admitting: Endocrinology

## 2021-07-18 VITALS — BP 112/68 | HR 68 | Ht 67.0 in | Wt 170.0 lb

## 2021-07-18 DIAGNOSIS — E1165 Type 2 diabetes mellitus with hyperglycemia: Secondary | ICD-10-CM

## 2021-07-18 NOTE — Patient Instructions (Addendum)
Bedtime snack daily  Check blood sugars on waking up 4 days a week  Also check blood sugars about 2 hours after meals and do this after different meals by rotation  Recommended blood sugar levels on waking up are 90-130 and about 2 hours after meal is 130-160  Please bring your blood sugar monitor to each visit, thank you

## 2021-07-18 NOTE — Progress Notes (Signed)
Patient ID: [name] Little, male   DOB: January 09, 1951, 71 y.o.   MRN: [id number]   Reason for Appointment : Followup for Type 2 Diabetes   History of Present Illness          Diagnosis: Type 2 diabetes mellitus, date of diagnosis: 1994   Recent history:   Oral hypoglycemic drugs: Janumet XR 50/1000, 2 tablets at dinner, Jardiance 25 mg before dinner, Amaryl  0.5 mg at hs  He tends to have relatively high A1c compared to his home blood sugar average  A1c is still relatively higher at 8.6 compared to 7.1 in July   Previous fructosamine 349 now about the same at 347  Current blood sugar patterns, management and problems identified: He has been checking blood sugars mostly overnight and not directed postprandially  Only occasionally will do some fasting readings but not lately  His fasting lab glucose was 151, however at home was 122 the same morning fasting  He says he has been having intercurrent illnesses with viral infections and sinusitis but has not had any steroids  He has not done much exercise lately Weight is down 2 pounds  His evening meal is relatively early around 5 PM but he does not check his blood sugars still after midnight  Also last night he felt unsteady around 2 AM and blood sugar was 68, he thinks he felt a little better with juice after a while  Highest blood sugar 181 fasting    Glucose monitoring:  less than 1 times daily        Glucometer: One Touch ultra 2  Blood Glucose readings from download    PRE-MEAL Fasting Lunch Dinner Bedtime Overall  Glucose range: 122-181    68-181  Mean/median:     112   POST-MEAL PC Breakfast PC Lunch PC Dinner  Glucose range:   ?  Mean/median:      Previously  PRE-MEAL Fasting Lunch Dinner Bedtime Overall  Glucose range: 151-184    76-184  Mean/median: 164       POST-MEAL PC Breakfast PC Lunch PC Dinner  Glucose range:   87-150  Mean/median:        Self-care: Avoiding drinks with sugar. Breakfast:  oatmeal or egg/toast, sometimes will have croissant meat sandwich       Dietician visit: Most recent: Several years ago        Side effects from medications have been none     Wt Readings from Last 3 Encounters:  07/18/21 170 lb (77.1 kg)  04/18/21 172 lb 8 oz (78.2 kg)  01/12/21 166 lb (75.3 kg)   Lab Results  Component Value Date   HGBA1C 8.6 (H) 07/13/2021   HGBA1C 8.5 (H) 04/11/2021   HGBA1C 7.1 (A) 01/12/2021   Lab Results  Component Value Date   MICROALBUR <0.7 01/12/2021   [place] 65 04/11/2021   CREATININE 0.89 07/13/2021    Lab Results  Component Value Date   FRUCTOSAMINE 347 (H) 07/13/2021   FRUCTOSAMINE 349 (H) 11/27/2019   FRUCTOSAMINE 327 (H) 03/02/2019    Past history: He has been treated for his diabetes with Glucovance for many years  with variable control Also appears to have had minimal diabetes education and has been monitoring his blood sugars only sporadically Review of his A1c shows he has had levels of at least 8% since 2009 and high levels in the last 2 years. On his regimen of glyburide/metformin and low dose Januvia his blood sugars were fluctuating significantly  ranging from 80-200+ and also occasional symptoms of hypoglycemia His glyburide was stopped because of significant variability in blood sugars and tendency to hypoglycemia.  Also his Januvia was increased from 25 mg daily therapeutic dose of 100 mg. He was started on low-dose Amaryl on his  visit in 3/15   Lab on 07/13/2021  Component Date Value Ref Range Status   PSA 07/13/2021 1.34  0.10 - 4.00 ng/ml Final   Test performed using Access Hybritech PSA Assay, a parmagnetic partical, chemiluminecent immunoassay.   Free T4 07/13/2021 0.98  0.60 - 1.60 ng/dL Final   Comment: Specimens from patients who are undergoing biotin therapy and /or ingesting biotin supplements may contain high levels of biotin.  The higher biotin concentration in these specimens interferes with this Free T4 assay.   Specimens that contain high levels  of biotin may cause false high results for this Free T4 assay.  Please interpret results in light of the total clinical presentation of the patient.     TSH 07/13/2021 2.84  0.35 - 5.50 uIU/mL Final   Sodium 07/13/2021 137  135 - 145 mEq/L Final   Potassium 07/13/2021 4.1  3.5 - 5.1 mEq/L Final   Chloride 07/13/2021 100  96 - 112 mEq/L Final   CO2 07/13/2021 29  19 - 32 mEq/L Final   Glucose, Bld 07/13/2021 151 (H)  70 - 99 mg/dL Final   BUN 07/13/2021 22  6 - 23 mg/dL Final   Creatinine, Ser 07/13/2021 0.89  0.40 - 1.50 mg/dL Final   Total Bilirubin 07/13/2021 0.6  0.2 - 1.2 mg/dL Final   Alkaline Phosphatase 07/13/2021 56  39 - 117 U/L Final   AST 07/13/2021 18  0 - 37 U/L Final   ALT 07/13/2021 23  0 - 53 U/L Final   Total Protein 07/13/2021 7.0  6.0 - 8.3 g/dL Final   Albumin 07/13/2021 4.3  3.5 - 5.2 g/dL Final   GFR 07/13/2021 86.83  >60.00 mL/min Final   Calculated using the CKD-EPI Creatinine Equation (2021)   Calcium 07/13/2021 9.2  8.4 - 10.5 mg/dL Final   Fructosamine 07/13/2021 347 (H)  0 - 285 umol/L Final   Comment: Published reference interval for apparently healthy subjects between age 55 and 70 is 72 - 285 umol/L and in a poorly controlled diabetic population is 228 - 563 umol/L with a mean of 396 umol/L.    Hgb A1c MFr Bld 07/13/2021 8.6 (H)  4.6 - 6.5 % Final   Glycemic Control Guidelines for People with Diabetes:Non Diabetic:  <6%Goal of Therapy: <7%Additional Action Suggested:  >8%      Allergies as of 07/18/2021   No Known Allergies      Medication List        Accurate as of July 18, 2021 11:03 AM. If you have any questions, ask your nurse or doctor.          aspirin 81 MG tablet Take 81 mg by mouth at bedtime.   empagliflozin 25 MG Tabs tablet Commonly known as: Jardiance Take 1 tablet (25 mg total) by mouth daily.   glimepiride 1 MG tablet Commonly known as: AMARYL TAKE 1 TABLET BY MOUTH EVERY DAY    imiquimod 5 % cream Commonly known as: ALDARA APPLY ON THE SKIN AS DIRECTED. APPLY TO SCALP MONDAY,WEDNESDAY,FRIDAY FOR 4WEEKS   Insulin Lispro Prot & Lispro (75-25) 100 UNIT/ML Kwikpen Commonly known as: HumaLOG Mix 75/25 KwikPen Inject 18 Units into the skin daily with supper.  Insulin Pen Needle 31G X 5 MM Misc Use on pen qd   Janumet XR 50-1000 MG Tb24 Generic drug: SitaGLIPtin-MetFORMIN HCl TAKE 2 TABLETS BY MOUTH EVERY DAY   latanoprost 0.005 % ophthalmic solution Commonly known as: XALATAN   mometasone 50 MCG/ACT nasal spray Commonly known as: Nasonex Place 2 sprays into the nose daily. 2 puffs twice daily   ONE TOUCH ULTRA SYSTEM KIT w/Device Kit Use to test once daily   OneTouch Delica Lancets 77O Misc USE TO TEST ONCE DAILY   OneTouch Ultra test strip Generic drug: glucose blood USE TO CHECK ONCE DAILY- DX CODE- E11.65   simvastatin 20 MG tablet Commonly known as: ZOCOR TAKE 1 TABLET BY MOUTH EVERY DAY        Allergies: No Known Allergies  Past Medical History:  Diagnosis Date   Actinic keratosis    Obesity, unspecified    Other and unspecified hyperlipidemia    Type II or unspecified type diabetes mellitus without mention of complication, not stated as uncontrolled    Unspecified essential hypertension     Past Surgical History:  Procedure Laterality Date   ROTATOR CUFF REPAIR      Family History  Problem Relation Age of Onset   COPD Father    Diabetes Mother    Cancer Brother    Thyroid cancer Brother    Colon cancer Neg Hx     Social History:  reports that he quit smoking about 23 years ago. His smoking use included cigars and cigarettes. He has a 72.00 pack-year smoking history. He has never used smokeless tobacco. He reports that he does not drink alcohol and does not use drugs.    Review of Systems       Hypercholesterolemia:    He has been taking simvastatin 20 mg with control of LDL    Lab Results  Component Value Date    CHOL 133 04/11/2021   HDL 49.10 04/11/2021   [place] 65 04/11/2021   TRIG 92.0 04/11/2021   CHOLHDL 3 04/11/2021    The blood pressure has been normal without any medications, also on SGLT2 drugs   He is followed  regularly by his ophthalmologist for glaucoma   Last foot exam: 5/22    Physical Examination:  BP 112/68    Pulse 68    Ht _0  (1.702 m)    Wt 170 lb (77.1 kg)    SpO2 99%    BMI 26.63 kg/m      ASSESSMENT:  Diabetes type 2 non-insulin-dependent  See history of present illness for details  of current diabetes management, blood sugar patterns and problems identified   His A1c which was previously much better at 7.1 is still high at 8.6  He is on Jardiance 25 mg and 2 g metformin as well as 0.5 mg Amaryl at bedtime as before  He has been reluctant to start any basal insulin despite poor control and persistently high fasting readings Also not clear what his blood sugars are after dinner Generally does better with following a consistent diet and regular exercise but he has not been doing much consistently Also may have had higher readings with interval illnesses in the last month or so  For now we will continue the same regimen To avoid low sugars overnight he will have a bedtime snack with some protein, discussed options Start exercise when able to Check sugars 2 hours after dinner regularly and also fasting Switch the old One Touch meter to the  new One Touch Verio which was given to him     Patient Instructions  Bedtime snack daily  Check blood sugars on waking up 4 days a week  Also check blood sugars about 2 hours after meals and do this after different meals by rotation  Recommended blood sugar levels on waking up are 90-130 and about 2 hours after meal is 130-160  Please bring your blood sugar monitor to each visit, thank you      Elayne Snare   07/18/2021, 11:03 AM   Note: This office note was prepared with Dragon voice recognition  system technology. Any transcriptional errors that result from this process are unintentional.

## 2021-08-02 DIAGNOSIS — L570 Actinic keratosis: Secondary | ICD-10-CM | POA: Diagnosis not present

## 2021-09-14 ENCOUNTER — Other Ambulatory Visit: Payer: Self-pay

## 2021-09-14 ENCOUNTER — Other Ambulatory Visit (INDEPENDENT_AMBULATORY_CARE_PROVIDER_SITE_OTHER): Payer: Medicare Other

## 2021-09-14 DIAGNOSIS — E1165 Type 2 diabetes mellitus with hyperglycemia: Secondary | ICD-10-CM

## 2021-09-14 LAB — BASIC METABOLIC PANEL
BUN: 18 mg/dL (ref 6–23)
CO2: 27 mEq/L (ref 19–32)
Calcium: 9.1 mg/dL (ref 8.4–10.5)
Chloride: 103 mEq/L (ref 96–112)
Creatinine, Ser: 0.89 mg/dL (ref 0.40–1.50)
GFR: 86.72 mL/min (ref >60.00)
Glucose, Bld: 144 mg/dL — ABNORMAL HIGH (ref 70–99)
Potassium: 4.3 mEq/L (ref 3.5–5.1)
Sodium: 138 mEq/L (ref 135–145)

## 2021-09-15 DIAGNOSIS — T63451A Toxic effect of venom of hornets, accidental (unintentional), initial encounter: Secondary | ICD-10-CM | POA: Diagnosis not present

## 2021-09-15 DIAGNOSIS — L03116 Cellulitis of left lower limb: Secondary | ICD-10-CM | POA: Diagnosis not present

## 2021-09-15 LAB — FRUCTOSAMINE: Fructosamine: 342 umol/L — ABNORMAL HIGH (ref 0–285)

## 2021-09-19 ENCOUNTER — Other Ambulatory Visit: Payer: Self-pay

## 2021-09-19 ENCOUNTER — Ambulatory Visit (INDEPENDENT_AMBULATORY_CARE_PROVIDER_SITE_OTHER): Payer: Medicare Other | Admitting: Endocrinology

## 2021-09-19 ENCOUNTER — Encounter: Payer: Self-pay | Admitting: Endocrinology

## 2021-09-19 VITALS — BP 120/78 | HR 67 | Ht 67.0 in | Wt 169.0 lb

## 2021-09-19 DIAGNOSIS — E1165 Type 2 diabetes mellitus with hyperglycemia: Secondary | ICD-10-CM | POA: Diagnosis not present

## 2021-09-19 DIAGNOSIS — E7800 Pure hypercholesterolemia, unspecified: Secondary | ICD-10-CM

## 2021-09-19 MED ORDER — REPAGLINIDE 1 MG PO TABS: 1.0000 mg | ORAL_TABLET | Freq: Three times a day (TID) | ORAL | 1 refills | Status: DC

## 2021-09-19 MED ORDER — CANAGLIFLOZIN 300 MG PO TABS: 300.0000 mg | ORAL_TABLET | Freq: Every day | ORAL | 3 refills | Status: AC

## 2021-09-19 NOTE — Patient Instructions (Addendum)
Stop Glimeperide and start Repeglanide upto 30 min before each meal ? ?Check blood sugars on waking up 2-3  days a week ? ?Also check blood sugars about 2 hours after meals and do this after different meals by rotation ? ?Recommended blood sugar levels on waking up are 90-130 and about 2 hours after meal is 130-160 ? ?Please bring your blood sugar monitor to each visit, thank you ? ?Change Jardiance to before bfst ? ?Exercise more ? ?

## 2021-09-19 NOTE — Progress Notes (Signed)
? ? ?Patient ID: [name] Little, male   DOB: [birth year], 71 y.o.   MRN: [id number] ? ? ?Reason for Appointment : Followup for Type 2 Diabetes ? ? ?History of Present Illness  ?        ?Diagnosis: Type 2 diabetes mellitus, date of diagnosis: 1994  ? ?Recent history:  ? ?Oral hypoglycemic drugs: Janumet XR 50/1000, 2 tablets at dinner, Jardiance 25 mg before dinner, Amaryl  0.5 mg at hs ? ?He tends to have relatively high A1c compared to his home blood sugar average ? ?A1c is still relatively higher at 8.6 compared to 7.1 in July ? ? ?Previous fructosamine 349 now about the same at 342 ? ?Current blood sugar patterns, management and problems identified: ?He has been checking blood sugars mostly fasting and only rarely after meals ?However did have 1 reading over 300 without any dietary indiscretion  ?Her medications were continued unchanged on the last visit as he preferred not to start insulin  ?Although he thinks he is doing fairly well with his diet this may not be consistent  ?He still has not done any formal exercise as directed  ?Morning sugars are somewhat variable but he does not think he has any significant low sugars overnight recently ? ?Weight is down 1 pounds  ?Highest morning blood sugar 182 possibly after breakfast ?   ?Glucose monitoring:  less than 1 times daily        Glucometer: One Touch  ? ?Blood Glucose readings from download  ? ? ?PRE-MEAL Fasting Lunch Dinner Bedtime Overall  ?Glucose range: 126-182 99     ?Mean/median: 148    157  ? ?POST-MEAL PC Breakfast PC Lunch PC Dinner  ?Glucose range:  304 194  ?Mean/median:     ? ?Prior ? ? ?PRE-MEAL Fasting Lunch Dinner Bedtime Overall  ?Glucose range: 122-181    68-181  ?Mean/median:     112  ? ?POST-MEAL PC Breakfast PC Lunch PC Dinner  ?Glucose range:   ?  ?Mean/median:     ?  ?  ? ?Self-care: ?Avoiding drinks with sugar. Breakfast: oatmeal or egg/toast, sometimes will have croissant meat sandwich ?      ?Dietician visit: Most recent: Several  years ago ?       ?Side effects from medications have been none  ? ? ? ?Wt Readings from Last 3 Encounters:  ?09/19/21 169 lb (76.7 kg)  ?07/18/21 170 lb (77.1 kg)  ?04/18/21 172 lb 8 oz (78.2 kg)  ? ?Lab Results  ?Component Value Date  ? HGBA1C 8.6 (H) 07/13/2021  ? HGBA1C 8.5 (H) 04/11/2021  ? HGBA1C 7.1 (A) 01/12/2021  ? ?Lab Results  ?Component Value Date  ? MICROALBUR <0.7 01/12/2021  ? [place] 65 04/11/2021  ? CREATININE 0.89 09/14/2021  ? ? ?Lab Results  ?Component Value Date  ? FRUCTOSAMINE 342 (H) 09/14/2021  ? FRUCTOSAMINE 347 (H) 07/13/2021  ? FRUCTOSAMINE 349 (H) 11/27/2019  ? ? ?Past history: He has been treated for his diabetes with Glucovance for many years  with variable control ?Also appears to have had minimal diabetes education and has been monitoring his blood sugars only sporadically ?Review of his A1c shows he has had levels of at least 8% since 2009 and high levels in the last 2 years. ?On his regimen of glyburide/metformin and low dose Januvia his blood sugars were fluctuating significantly ranging from 80-200+ and also occasional symptoms of hypoglycemia His glyburide was stopped because of significant variability in blood sugars and  tendency to hypoglycemia.  ?Also his Januvia was increased from 25 mg daily therapeutic dose of 100 mg. ?He was started on low-dose Amaryl on his  visit in 3/15 ? ? ?Lab on 09/14/2021  ?Component Date Value Ref Range Status  ? Sodium 09/14/2021 138  135 - 145 mEq/L Final  ? Potassium 09/14/2021 4.3  3.5 - 5.1 mEq/L Final  ? Chloride 09/14/2021 103  96 - 112 mEq/L Final  ? CO2 09/14/2021 27  19 - 32 mEq/L Final  ? Glucose, Bld 09/14/2021 144 (H)  70 - 99 mg/dL Final  ? BUN 09/14/2021 18  6 - 23 mg/dL Final  ? Creatinine, Ser 09/14/2021 0.89  0.40 - 1.50 mg/dL Final  ? GFR 09/14/2021 86.72  >60.00 mL/min Final  ? Calculated using the CKD-EPI Creatinine Equation (2021)  ? Calcium 09/14/2021 9.1  8.4 - 10.5 mg/dL Final  ? Fructosamine 09/14/2021 342 (H)  0 - 285  umol/L Final  ? Comment: Published reference interval for apparently healthy subjects ?between age 46 and 66 is 75 - 285 umol/L and in a poorly ?controlled diabetic population is 228 - 563 umol/L with a ?mean of 396 umol/L. ?  ? ? ? ?Allergies as of 09/19/2021   ?No Known Allergies ?  ? ?  ?Medication List  ?  ? ?  ? Accurate as of September 19, 2021  2:00 PM. If you have any questions, ask your nurse or doctor.  ?  ?  ? ?  ? ?aspirin 81 MG tablet ?Take 81 mg by mouth at bedtime. ?  ?cephALEXin 500 MG capsule ?Commonly known as: KEFLEX ?Take 500 mg by mouth 2 (two) times daily. ?  ?empagliflozin 25 MG Tabs tablet ?Commonly known as: Jardiance ?Take 1 tablet (25 mg total) by mouth daily. ?  ?glimepiride 1 MG tablet ?Commonly known as: AMARYL ?TAKE 1 TABLET BY MOUTH EVERY DAY ?  ?imiquimod 5 % cream ?Commonly known as: ALDARA ?APPLY ON THE SKIN AS DIRECTED. APPLY TO SCALP MONDAY,WEDNESDAY,FRIDAY FOR 4WEEKS ?  ?Insulin Lispro Prot & Lispro (75-25) 100 UNIT/ML Kwikpen ?Commonly known as: HumaLOG Mix 75/25 KwikPen ?Inject 18 Units into the skin daily with supper. ?  ?Insulin Pen Needle 31G X 5 MM Misc ?Use on pen qd ?  ?Janumet XR 50-1000 MG Tb24 ?Generic drug: SitaGLIPtin-MetFORMIN HCl ?TAKE 2 TABLETS BY MOUTH EVERY DAY ?  ?latanoprost 0.005 % ophthalmic solution ?Commonly known as: XALATAN ?  ?methylPREDNISolone 4 MG Tbpk tablet ?Commonly known as: MEDROL DOSEPAK ?TAKE 6 TABLETS ON DAY 1 AS DIRECTED ON PACKAGE AND DECREASE BY 1 TAB EACH DAY FOR A TOTAL OF 6 DAYS ?  ?mometasone 50 MCG/ACT nasal spray ?Commonly known as: Nasonex ?Place 2 sprays into the nose daily. 2 puffs twice daily ?  ?ONE TOUCH ULTRA SYSTEM KIT w/Device Kit ?Use to test once daily ?  ?OneTouch Delica Lancets 17C Misc ?USE TO TEST ONCE DAILY ?  ?OneTouch Ultra test strip ?Generic drug: glucose blood ?USE TO CHECK ONCE DAILY- DX CODE- E11.65 ?  ?simvastatin 20 MG tablet ?Commonly known as: ZOCOR ?TAKE 1 TABLET BY MOUTH EVERY DAY ?  ? ?  ? ? ?Allergies: No  Known Allergies ? ?Past Medical History:  ?Diagnosis Date  ? Actinic keratosis   ? Obesity, unspecified   ? Other and unspecified hyperlipidemia   ? Type II or unspecified type diabetes mellitus without mention of complication, not stated as uncontrolled   ? Unspecified essential hypertension   ? ? ?Past Surgical  History:  ?Procedure Laterality Date  ? ROTATOR CUFF REPAIR    ? ? ?Family History  ?Problem Relation Age of Onset  ? COPD Father   ? Diabetes Mother   ? Cancer Brother   ? Thyroid cancer Brother   ? Colon cancer Neg Hx   ? ? ?Social History:  reports that he quit smoking about 23 years ago. His smoking use included cigars and cigarettes. He has a 72.00 pack-year smoking history. He has never used smokeless tobacco. He reports that he does not drink alcohol and does not use drugs. ? ?  ?Review of Systems  ? ?    Hypercholesterolemia:   ? ?He has been taking simvastatin 20 mg with control of LDL ?  ? ?Lab Results  ?Component Value Date  ? CHOL 133 04/11/2021  ? HDL 49.10 04/11/2021  ? [place] 65 04/11/2021  ? TRIG 92.0 04/11/2021  ? CHOLHDL 3 04/11/2021  ? ? ?The blood pressure has been normal without any medications, also on SGLT2 drugs ? ? ?He is followed  regularly by his ophthalmologist for glaucoma  ? ?Last foot exam: 5/22 ? ?  ?Physical Examination: ? ?BP 120/78   Pulse 67   Ht 5' 7" (1.702 m)   Wt 169 lb (76.7 kg)   SpO2 98%   BMI 26.47 kg/m?  ?  ? ? ?ASSESSMENT: ? ?Diabetes type 2 non-insulin-dependent ? ?See history of present illness for details  of current diabetes management, blood sugar patterns and problems identified ?  ?His A1c which was last 8.6 ?Fructosamine is still significantly high at 342 similar to previous readings ? ?He is on Jardiance 25 mg and 2 g metformin as well as 0.5 mg Amaryl at bedtime as before ? ?He likely has high postprandial readings which he does not monitor ?He has insulin deficiency but does not consistently have high fasting reading which may be as low as  126 ? ?Currently does not have excessive carbohydrate and usually tries to get some protein with breakfast although this is variable ? ?He will now benefit from a trial of repaglinide instead of glimepiride low-do

## 2021-09-29 ENCOUNTER — Other Ambulatory Visit: Payer: Self-pay | Admitting: Endocrinology

## 2021-10-05 ENCOUNTER — Telehealth: Payer: Self-pay

## 2021-10-05 NOTE — Telephone Encounter (Signed)
After speaking with Dr Dwyane Dee he says for patient to try to take half in morning and half in evening and see if that works for the patient better. Wife states they will try and see what happens ?

## 2021-10-05 NOTE — Telephone Encounter (Signed)
Wife called in about jardiance. States patient is supposed to be on Jardiance and not invokana. Looks like Invokana was sent in on 3/14. Please confirm ?

## 2021-10-07 ENCOUNTER — Other Ambulatory Visit: Payer: Self-pay | Admitting: Endocrinology

## 2021-10-08 ENCOUNTER — Other Ambulatory Visit: Payer: Self-pay | Admitting: Endocrinology

## 2021-10-13 ENCOUNTER — Other Ambulatory Visit: Payer: Self-pay | Admitting: Endocrinology

## 2021-10-18 DIAGNOSIS — I10 Essential (primary) hypertension: Secondary | ICD-10-CM | POA: Diagnosis not present

## 2021-10-18 DIAGNOSIS — E1169 Type 2 diabetes mellitus with other specified complication: Secondary | ICD-10-CM | POA: Diagnosis not present

## 2021-10-18 DIAGNOSIS — E7800 Pure hypercholesterolemia, unspecified: Secondary | ICD-10-CM | POA: Diagnosis not present

## 2021-10-18 DIAGNOSIS — E785 Hyperlipidemia, unspecified: Secondary | ICD-10-CM | POA: Diagnosis not present

## 2021-11-07 ENCOUNTER — Other Ambulatory Visit: Payer: Self-pay | Admitting: Endocrinology

## 2021-11-21 ENCOUNTER — Other Ambulatory Visit: Payer: Medicare Other

## 2021-11-23 ENCOUNTER — Ambulatory Visit: Payer: Medicare Other | Admitting: Endocrinology

## 2021-11-28 DIAGNOSIS — E7800 Pure hypercholesterolemia, unspecified: Secondary | ICD-10-CM | POA: Diagnosis not present

## 2021-11-28 DIAGNOSIS — N400 Enlarged prostate without lower urinary tract symptoms: Secondary | ICD-10-CM | POA: Diagnosis not present

## 2021-11-28 DIAGNOSIS — E785 Hyperlipidemia, unspecified: Secondary | ICD-10-CM | POA: Diagnosis not present

## 2021-11-28 DIAGNOSIS — E1169 Type 2 diabetes mellitus with other specified complication: Secondary | ICD-10-CM | POA: Diagnosis not present

## 2021-11-28 DIAGNOSIS — Z0000 Encounter for general adult medical examination without abnormal findings: Secondary | ICD-10-CM | POA: Diagnosis not present

## 2021-11-28 DIAGNOSIS — I10 Essential (primary) hypertension: Secondary | ICD-10-CM | POA: Diagnosis not present

## 2021-11-28 DIAGNOSIS — Z79899 Other long term (current) drug therapy: Secondary | ICD-10-CM | POA: Diagnosis not present

## 2021-12-05 DIAGNOSIS — L570 Actinic keratosis: Secondary | ICD-10-CM | POA: Diagnosis not present

## 2022-02-26 DIAGNOSIS — H40023 Open angle with borderline findings, high risk, bilateral: Secondary | ICD-10-CM | POA: Diagnosis not present

## 2022-02-26 DIAGNOSIS — E119 Type 2 diabetes mellitus without complications: Secondary | ICD-10-CM | POA: Diagnosis not present

## 2022-02-26 DIAGNOSIS — H2513 Age-related nuclear cataract, bilateral: Secondary | ICD-10-CM | POA: Diagnosis not present

## 2022-02-26 DIAGNOSIS — H47293 Other optic atrophy, bilateral: Secondary | ICD-10-CM | POA: Diagnosis not present

## 2022-03-26 DIAGNOSIS — E7800 Pure hypercholesterolemia, unspecified: Secondary | ICD-10-CM | POA: Diagnosis not present

## 2022-03-26 DIAGNOSIS — E785 Hyperlipidemia, unspecified: Secondary | ICD-10-CM | POA: Diagnosis not present

## 2022-03-26 DIAGNOSIS — E1169 Type 2 diabetes mellitus with other specified complication: Secondary | ICD-10-CM | POA: Diagnosis not present

## 2022-03-26 DIAGNOSIS — I10 Essential (primary) hypertension: Secondary | ICD-10-CM | POA: Diagnosis not present

## 2022-04-25 DIAGNOSIS — Z23 Encounter for immunization: Secondary | ICD-10-CM | POA: Diagnosis not present

## 2022-06-26 DIAGNOSIS — L570 Actinic keratosis: Secondary | ICD-10-CM | POA: Diagnosis not present

## 2022-06-26 DIAGNOSIS — L821 Other seborrheic keratosis: Secondary | ICD-10-CM | POA: Diagnosis not present

## 2022-06-26 DIAGNOSIS — D225 Melanocytic nevi of trunk: Secondary | ICD-10-CM | POA: Diagnosis not present

## 2022-07-24 DIAGNOSIS — E7800 Pure hypercholesterolemia, unspecified: Secondary | ICD-10-CM | POA: Diagnosis not present

## 2022-07-24 DIAGNOSIS — E1169 Type 2 diabetes mellitus with other specified complication: Secondary | ICD-10-CM | POA: Diagnosis not present

## 2022-07-24 DIAGNOSIS — I10 Essential (primary) hypertension: Secondary | ICD-10-CM | POA: Diagnosis not present

## 2022-07-24 DIAGNOSIS — Z79899 Other long term (current) drug therapy: Secondary | ICD-10-CM | POA: Diagnosis not present

## 2022-07-24 DIAGNOSIS — E785 Hyperlipidemia, unspecified: Secondary | ICD-10-CM | POA: Diagnosis not present

## 2022-07-26 DIAGNOSIS — M542 Cervicalgia: Secondary | ICD-10-CM | POA: Diagnosis not present

## 2022-07-26 DIAGNOSIS — M25512 Pain in left shoulder: Secondary | ICD-10-CM | POA: Diagnosis not present

## 2022-08-16 DIAGNOSIS — H47293 Other optic atrophy, bilateral: Secondary | ICD-10-CM | POA: Diagnosis not present

## 2022-08-16 DIAGNOSIS — E119 Type 2 diabetes mellitus without complications: Secondary | ICD-10-CM | POA: Diagnosis not present

## 2022-08-16 DIAGNOSIS — H524 Presbyopia: Secondary | ICD-10-CM | POA: Diagnosis not present

## 2022-08-16 DIAGNOSIS — H2513 Age-related nuclear cataract, bilateral: Secondary | ICD-10-CM | POA: Diagnosis not present

## 2022-08-16 DIAGNOSIS — H40023 Open angle with borderline findings, high risk, bilateral: Secondary | ICD-10-CM | POA: Diagnosis not present

## 2022-08-24 DIAGNOSIS — J069 Acute upper respiratory infection, unspecified: Secondary | ICD-10-CM | POA: Diagnosis not present

## 2022-08-24 DIAGNOSIS — R051 Acute cough: Secondary | ICD-10-CM | POA: Diagnosis not present

## 2022-09-17 DIAGNOSIS — Z6826 Body mass index (BMI) 26.0-26.9, adult: Secondary | ICD-10-CM | POA: Diagnosis not present

## 2022-09-17 DIAGNOSIS — M62830 Muscle spasm of back: Secondary | ICD-10-CM | POA: Diagnosis not present

## 2022-12-18 DIAGNOSIS — Z125 Encounter for screening for malignant neoplasm of prostate: Secondary | ICD-10-CM | POA: Diagnosis not present

## 2022-12-18 DIAGNOSIS — N400 Enlarged prostate without lower urinary tract symptoms: Secondary | ICD-10-CM | POA: Diagnosis not present

## 2022-12-18 DIAGNOSIS — I10 Essential (primary) hypertension: Secondary | ICD-10-CM | POA: Diagnosis not present

## 2022-12-18 DIAGNOSIS — E7800 Pure hypercholesterolemia, unspecified: Secondary | ICD-10-CM | POA: Diagnosis not present

## 2022-12-18 DIAGNOSIS — Z79899 Other long term (current) drug therapy: Secondary | ICD-10-CM | POA: Diagnosis not present

## 2022-12-18 DIAGNOSIS — E1169 Type 2 diabetes mellitus with other specified complication: Secondary | ICD-10-CM | POA: Diagnosis not present

## 2022-12-18 DIAGNOSIS — Z0000 Encounter for general adult medical examination without abnormal findings: Secondary | ICD-10-CM | POA: Diagnosis not present

## 2022-12-18 DIAGNOSIS — E785 Hyperlipidemia, unspecified: Secondary | ICD-10-CM | POA: Diagnosis not present

## 2023-01-17 DIAGNOSIS — L570 Actinic keratosis: Secondary | ICD-10-CM | POA: Diagnosis not present

## 2023-02-13 DIAGNOSIS — H40023 Open angle with borderline findings, high risk, bilateral: Secondary | ICD-10-CM | POA: Diagnosis not present

## 2023-02-13 DIAGNOSIS — E119 Type 2 diabetes mellitus without complications: Secondary | ICD-10-CM | POA: Diagnosis not present

## 2023-02-13 DIAGNOSIS — H47293 Other optic atrophy, bilateral: Secondary | ICD-10-CM | POA: Diagnosis not present

## 2023-02-13 DIAGNOSIS — H524 Presbyopia: Secondary | ICD-10-CM | POA: Diagnosis not present

## 2023-02-13 DIAGNOSIS — H2513 Age-related nuclear cataract, bilateral: Secondary | ICD-10-CM | POA: Diagnosis not present

## 2023-04-29 DIAGNOSIS — Z23 Encounter for immunization: Secondary | ICD-10-CM | POA: Diagnosis not present

## 2023-06-18 DIAGNOSIS — E7800 Pure hypercholesterolemia, unspecified: Secondary | ICD-10-CM | POA: Diagnosis not present

## 2023-06-18 DIAGNOSIS — I10 Essential (primary) hypertension: Secondary | ICD-10-CM | POA: Diagnosis not present

## 2023-06-18 DIAGNOSIS — Z79899 Other long term (current) drug therapy: Secondary | ICD-10-CM | POA: Diagnosis not present

## 2023-06-18 DIAGNOSIS — E1169 Type 2 diabetes mellitus with other specified complication: Secondary | ICD-10-CM | POA: Diagnosis not present

## 2023-06-18 DIAGNOSIS — E785 Hyperlipidemia, unspecified: Secondary | ICD-10-CM | POA: Diagnosis not present

## 2023-07-25 DIAGNOSIS — L578 Other skin changes due to chronic exposure to nonionizing radiation: Secondary | ICD-10-CM | POA: Diagnosis not present

## 2023-07-25 DIAGNOSIS — L570 Actinic keratosis: Secondary | ICD-10-CM | POA: Diagnosis not present

## 2023-07-25 DIAGNOSIS — L814 Other melanin hyperpigmentation: Secondary | ICD-10-CM | POA: Diagnosis not present

## 2023-12-18 DIAGNOSIS — L02512 Cutaneous abscess of left hand: Secondary | ICD-10-CM | POA: Diagnosis not present

## 2023-12-24 DIAGNOSIS — I10 Essential (primary) hypertension: Secondary | ICD-10-CM | POA: Diagnosis not present

## 2023-12-24 DIAGNOSIS — Z125 Encounter for screening for malignant neoplasm of prostate: Secondary | ICD-10-CM | POA: Diagnosis not present

## 2023-12-24 DIAGNOSIS — E1169 Type 2 diabetes mellitus with other specified complication: Secondary | ICD-10-CM | POA: Diagnosis not present

## 2023-12-24 DIAGNOSIS — Z0000 Encounter for general adult medical examination without abnormal findings: Secondary | ICD-10-CM | POA: Diagnosis not present

## 2023-12-24 DIAGNOSIS — Z79899 Other long term (current) drug therapy: Secondary | ICD-10-CM | POA: Diagnosis not present

## 2023-12-24 DIAGNOSIS — E7800 Pure hypercholesterolemia, unspecified: Secondary | ICD-10-CM | POA: Diagnosis not present

## 2023-12-24 DIAGNOSIS — E785 Hyperlipidemia, unspecified: Secondary | ICD-10-CM | POA: Diagnosis not present

## 2024-01-23 DIAGNOSIS — L570 Actinic keratosis: Secondary | ICD-10-CM | POA: Diagnosis not present

## 2024-01-23 DIAGNOSIS — L578 Other skin changes due to chronic exposure to nonionizing radiation: Secondary | ICD-10-CM | POA: Diagnosis not present

## 2024-01-23 DIAGNOSIS — D225 Melanocytic nevi of trunk: Secondary | ICD-10-CM | POA: Diagnosis not present

## 2024-01-23 DIAGNOSIS — L814 Other melanin hyperpigmentation: Secondary | ICD-10-CM | POA: Diagnosis not present

## 2024-04-28 DIAGNOSIS — Z23 Encounter for immunization: Secondary | ICD-10-CM | POA: Diagnosis not present

## 2024-06-23 DIAGNOSIS — E7800 Pure hypercholesterolemia, unspecified: Secondary | ICD-10-CM | POA: Diagnosis not present

## 2024-06-23 DIAGNOSIS — E785 Hyperlipidemia, unspecified: Secondary | ICD-10-CM | POA: Diagnosis not present

## 2024-06-23 DIAGNOSIS — I10 Essential (primary) hypertension: Secondary | ICD-10-CM | POA: Diagnosis not present

## 2024-06-23 DIAGNOSIS — E1169 Type 2 diabetes mellitus with other specified complication: Secondary | ICD-10-CM | POA: Diagnosis not present

## 2024-06-23 DIAGNOSIS — Z79899 Other long term (current) drug therapy: Secondary | ICD-10-CM | POA: Diagnosis not present
